# Patient Record
Sex: Female | Born: 1945
Health system: Southern US, Community
[De-identification: ages and names within clinical notes are randomized; demographics above are authoritative.]

## PROBLEM LIST (undated history)

## (undated) DIAGNOSIS — R42 Dizziness and giddiness: Secondary | ICD-10-CM

## (undated) DIAGNOSIS — R112 Nausea with vomiting, unspecified: Secondary | ICD-10-CM

## (undated) DIAGNOSIS — F329 Major depressive disorder, single episode, unspecified: Secondary | ICD-10-CM

## (undated) DIAGNOSIS — J45909 Unspecified asthma, uncomplicated: Secondary | ICD-10-CM

## (undated) DIAGNOSIS — S83209A Unspecified tear of unspecified meniscus, current injury, unspecified knee, initial encounter: Secondary | ICD-10-CM

## (undated) DIAGNOSIS — M549 Dorsalgia, unspecified: Secondary | ICD-10-CM

## (undated) DIAGNOSIS — K219 Gastro-esophageal reflux disease without esophagitis: Secondary | ICD-10-CM

## (undated) DIAGNOSIS — M1990 Unspecified osteoarthritis, unspecified site: Secondary | ICD-10-CM

## (undated) DIAGNOSIS — E039 Hypothyroidism, unspecified: Secondary | ICD-10-CM

## (undated) DIAGNOSIS — Z9889 Other specified postprocedural states: Secondary | ICD-10-CM

## (undated) DIAGNOSIS — Z8719 Personal history of other diseases of the digestive system: Secondary | ICD-10-CM

## (undated) DIAGNOSIS — T4145XA Adverse effect of unspecified anesthetic, initial encounter: Secondary | ICD-10-CM

## (undated) DIAGNOSIS — G8929 Other chronic pain: Secondary | ICD-10-CM

## (undated) DIAGNOSIS — E079 Disorder of thyroid, unspecified: Secondary | ICD-10-CM

## (undated) DIAGNOSIS — M797 Fibromyalgia: Secondary | ICD-10-CM

## (undated) DIAGNOSIS — Z973 Presence of spectacles and contact lenses: Secondary | ICD-10-CM

## (undated) DIAGNOSIS — F32A Depression, unspecified: Secondary | ICD-10-CM

## (undated) DIAGNOSIS — I10 Essential (primary) hypertension: Secondary | ICD-10-CM

## (undated) DIAGNOSIS — R011 Cardiac murmur, unspecified: Secondary | ICD-10-CM

## (undated) HISTORY — PX: CHOLECYSTECTOMY: SHX55

## (undated) HISTORY — PX: LAMINECTOMY: SHX219

## (undated) HISTORY — PX: BREAST SURGERY: SHX581

## (undated) HISTORY — PX: ELBOW ARTHROSCOPY: SUR87

## (undated) HISTORY — DX: Disorder of thyroid, unspecified: E07.9

## (undated) HISTORY — PX: FOOT GANGLION EXCISION: SHX1660

## (undated) HISTORY — PX: BACK SURGERY: SHX140

## (undated) HISTORY — PX: KNEE ARTHROSCOPY: SHX127

## (undated) HISTORY — PX: APPENDECTOMY: SHX54

## (undated) HISTORY — DX: Cardiac murmur, unspecified: R01.1

## (undated) HISTORY — PX: CARPAL TUNNEL RELEASE: SHX101

## (undated) HISTORY — PX: OTHER SURGICAL HISTORY: SHX169

## (undated) HISTORY — PX: ABDOMINAL HYSTERECTOMY: SHX81

## (undated) HISTORY — PX: COLONOSCOPY W/ POLYPECTOMY: SHX1380

---

## 1966-06-06 DIAGNOSIS — T8859XA Other complications of anesthesia, initial encounter: Secondary | ICD-10-CM

## 1966-06-06 HISTORY — PX: DILATION AND CURETTAGE OF UTERUS: SHX78

## 1966-06-06 HISTORY — DX: Other complications of anesthesia, initial encounter: T88.59XA

## 1974-06-06 HISTORY — PX: ABDOMINAL HYSTERECTOMY: SHX81

## 1998-02-02 ENCOUNTER — Ambulatory Visit (HOSPITAL_COMMUNITY): Admission: RE | Admit: 1998-02-02 | Discharge: 1998-02-02 | Payer: Self-pay | Admitting: Gastroenterology

## 1998-06-12 ENCOUNTER — Other Ambulatory Visit: Admission: RE | Admit: 1998-06-12 | Discharge: 1998-06-12 | Payer: Self-pay | Admitting: Family Medicine

## 1998-07-13 ENCOUNTER — Other Ambulatory Visit: Admission: RE | Admit: 1998-07-13 | Discharge: 1998-07-13 | Payer: Self-pay | Admitting: Family Medicine

## 1998-12-16 ENCOUNTER — Ambulatory Visit (HOSPITAL_COMMUNITY): Admission: RE | Admit: 1998-12-16 | Discharge: 1998-12-16 | Payer: Self-pay | Admitting: Obstetrics & Gynecology

## 2000-03-01 ENCOUNTER — Encounter: Payer: Self-pay | Admitting: Emergency Medicine

## 2000-03-01 ENCOUNTER — Emergency Department (HOSPITAL_COMMUNITY): Admission: EM | Admit: 2000-03-01 | Discharge: 2000-03-02 | Payer: Self-pay | Admitting: Emergency Medicine

## 2000-10-16 ENCOUNTER — Ambulatory Visit (HOSPITAL_COMMUNITY): Admission: RE | Admit: 2000-10-16 | Discharge: 2000-10-16 | Payer: Self-pay | Admitting: Neurosurgery

## 2000-10-16 ENCOUNTER — Encounter: Payer: Self-pay | Admitting: Neurosurgery

## 2000-10-20 ENCOUNTER — Other Ambulatory Visit: Admission: RE | Admit: 2000-10-20 | Discharge: 2000-10-20 | Payer: Self-pay | Admitting: Podiatry

## 2001-04-12 ENCOUNTER — Encounter: Payer: Self-pay | Admitting: Gastroenterology

## 2001-04-12 ENCOUNTER — Encounter: Admission: RE | Admit: 2001-04-12 | Discharge: 2001-04-12 | Payer: Self-pay | Admitting: Gastroenterology

## 2001-07-11 ENCOUNTER — Other Ambulatory Visit: Admission: RE | Admit: 2001-07-11 | Discharge: 2001-07-11 | Payer: Self-pay | Admitting: Obstetrics and Gynecology

## 2001-08-23 ENCOUNTER — Other Ambulatory Visit: Admission: RE | Admit: 2001-08-23 | Discharge: 2001-08-23 | Payer: Self-pay | Admitting: Obstetrics and Gynecology

## 2002-05-10 ENCOUNTER — Encounter: Payer: Self-pay | Admitting: Neurosurgery

## 2002-05-14 ENCOUNTER — Encounter: Payer: Self-pay | Admitting: Neurosurgery

## 2002-05-14 ENCOUNTER — Inpatient Hospital Stay (HOSPITAL_COMMUNITY): Admission: RE | Admit: 2002-05-14 | Discharge: 2002-05-18 | Payer: Self-pay | Admitting: Neurosurgery

## 2003-03-25 ENCOUNTER — Other Ambulatory Visit: Admission: RE | Admit: 2003-03-25 | Discharge: 2003-03-25 | Payer: Self-pay | Admitting: Obstetrics and Gynecology

## 2003-12-31 ENCOUNTER — Ambulatory Visit (HOSPITAL_COMMUNITY): Admission: RE | Admit: 2003-12-31 | Discharge: 2003-12-31 | Payer: Self-pay | Admitting: Gastroenterology

## 2004-01-20 ENCOUNTER — Ambulatory Visit (HOSPITAL_COMMUNITY): Admission: RE | Admit: 2004-01-20 | Discharge: 2004-01-20 | Payer: Self-pay | Admitting: Cardiology

## 2004-05-20 ENCOUNTER — Emergency Department (HOSPITAL_COMMUNITY): Admission: EM | Admit: 2004-05-20 | Discharge: 2004-05-20 | Payer: Self-pay | Admitting: Emergency Medicine

## 2005-01-14 ENCOUNTER — Ambulatory Visit: Payer: Self-pay | Admitting: Internal Medicine

## 2005-02-14 ENCOUNTER — Ambulatory Visit: Payer: Self-pay | Admitting: Internal Medicine

## 2005-03-14 ENCOUNTER — Ambulatory Visit: Payer: Self-pay | Admitting: Internal Medicine

## 2007-07-20 ENCOUNTER — Encounter (INDEPENDENT_AMBULATORY_CARE_PROVIDER_SITE_OTHER): Payer: Self-pay | Admitting: *Deleted

## 2007-07-20 ENCOUNTER — Ambulatory Visit (HOSPITAL_COMMUNITY): Admission: RE | Admit: 2007-07-20 | Discharge: 2007-07-20 | Payer: Self-pay | Admitting: *Deleted

## 2008-10-16 ENCOUNTER — Emergency Department (HOSPITAL_BASED_OUTPATIENT_CLINIC_OR_DEPARTMENT_OTHER): Admission: EM | Admit: 2008-10-16 | Discharge: 2008-10-17 | Payer: Self-pay | Admitting: Emergency Medicine

## 2008-11-13 ENCOUNTER — Encounter: Admission: RE | Admit: 2008-11-13 | Discharge: 2008-11-13 | Payer: Self-pay | Admitting: Internal Medicine

## 2008-12-01 ENCOUNTER — Emergency Department (HOSPITAL_BASED_OUTPATIENT_CLINIC_OR_DEPARTMENT_OTHER): Admission: EM | Admit: 2008-12-01 | Discharge: 2008-12-01 | Payer: Self-pay | Admitting: Emergency Medicine

## 2010-05-21 ENCOUNTER — Ambulatory Visit
Admission: RE | Admit: 2010-05-21 | Discharge: 2010-05-21 | Payer: Self-pay | Source: Home / Self Care | Attending: Orthopedic Surgery | Admitting: Orthopedic Surgery

## 2010-07-02 ENCOUNTER — Encounter
Admission: RE | Admit: 2010-07-02 | Discharge: 2010-07-02 | Payer: Self-pay | Source: Home / Self Care | Attending: Internal Medicine | Admitting: Internal Medicine

## 2010-08-16 LAB — BASIC METABOLIC PANEL
BUN: 11 mg/dL (ref 6–23)
CO2: 28 mEq/L (ref 19–32)
Calcium: 9.3 mg/dL (ref 8.4–10.5)
Chloride: 106 mEq/L (ref 96–112)
Creatinine, Ser: 0.89 mg/dL (ref 0.4–1.2)
GFR calc Af Amer: >60 mL/min (ref >60)
GFR calc non Af Amer: >60 mL/min (ref >60)
Glucose, Bld: 94 mg/dL (ref 70–99)
Potassium: 3.9 mEq/L (ref 3.5–5.1)
Sodium: 140 mEq/L (ref 135–145)

## 2010-08-16 LAB — POCT HEMOGLOBIN-HEMACUE: Hemoglobin: 14.2 g/dL (ref 12.0–15.0)

## 2010-09-13 LAB — URINALYSIS, ROUTINE W REFLEX MICROSCOPIC
Bilirubin Urine: NEGATIVE
Glucose, UA: NEGATIVE mg/dL
Ketones, ur: NEGATIVE mg/dL
Leukocytes, UA: NEGATIVE
Nitrite: NEGATIVE
Protein, ur: NEGATIVE mg/dL
Specific Gravity, Urine: 1.005 (ref 1.005–1.030)
Urobilinogen, UA: 0.2 mg/dL (ref 0.0–1.0)
pH: 7 (ref 5.0–8.0)

## 2010-09-13 LAB — COMPREHENSIVE METABOLIC PANEL
ALT: 26 U/L (ref 0–35)
AST: 32 U/L (ref 0–37)
Albumin: 4.1 g/dL (ref 3.5–5.2)
Alkaline Phosphatase: 83 U/L (ref 39–117)
BUN: 13 mg/dL (ref 6–23)
CO2: 29 mEq/L (ref 19–32)
Calcium: 9.7 mg/dL (ref 8.4–10.5)
Chloride: 105 mEq/L (ref 96–112)
Creatinine, Ser: 0.9 mg/dL (ref 0.4–1.2)
GFR calc Af Amer: >60 mL/min (ref >60)
GFR calc non Af Amer: >60 mL/min (ref >60)
Glucose, Bld: 90 mg/dL (ref 70–99)
Potassium: 3.6 mEq/L (ref 3.5–5.1)
Sodium: 142 mEq/L (ref 135–145)
Total Bilirubin: 0.7 mg/dL (ref 0.3–1.2)
Total Protein: 7.2 g/dL (ref 6.0–8.3)

## 2010-09-13 LAB — DIFFERENTIAL
Basophils Absolute: 0 10*3/uL (ref 0.0–0.1)
Basophils Relative: 0 % (ref 0–1)
Eosinophils Absolute: 0 10*3/uL (ref 0.0–0.7)
Eosinophils Relative: 0 % (ref 0–5)
Lymphocytes Relative: 33 % (ref 12–46)
Lymphs Abs: 2.3 10*3/uL (ref 0.7–4.0)
Monocytes Absolute: 0.4 10*3/uL (ref 0.1–1.0)
Monocytes Relative: 5 % (ref 3–12)
Neutro Abs: 4.2 10*3/uL (ref 1.7–7.7)
Neutrophils Relative %: 62 % (ref 43–77)

## 2010-09-13 LAB — URINE MICROSCOPIC-ADD ON

## 2010-09-13 LAB — URINE CULTURE
Colony Count: NO GROWTH
Culture: NO GROWTH

## 2010-09-13 LAB — CBC
HCT: 39.8 % (ref 36.0–46.0)
Hemoglobin: 13.8 g/dL (ref 12.0–15.0)
MCHC: 34.5 g/dL (ref 30.0–36.0)
MCV: 89.8 fL (ref 78.0–100.0)
Platelets: 277 10*3/uL (ref 150–400)
RBC: 4.43 MIL/uL (ref 3.87–5.11)
RDW: 13.9 % (ref 11.5–15.5)
WBC: 6.9 10*3/uL (ref 4.0–10.5)

## 2010-09-13 LAB — PREGNANCY, URINE: Preg Test, Ur: NEGATIVE

## 2010-09-14 LAB — URINALYSIS, ROUTINE W REFLEX MICROSCOPIC
Bilirubin Urine: NEGATIVE
Glucose, UA: NEGATIVE mg/dL
Ketones, ur: NEGATIVE mg/dL
Nitrite: NEGATIVE
Protein, ur: 100 mg/dL — AB
Specific Gravity, Urine: 1.017 (ref 1.005–1.030)
Urobilinogen, UA: 1 mg/dL (ref 0.0–1.0)
pH: 7 (ref 5.0–8.0)

## 2010-09-14 LAB — URINE MICROSCOPIC-ADD ON

## 2010-09-14 LAB — URINE CULTURE
Colony Count: NO GROWTH
Culture: NO GROWTH

## 2010-10-19 NOTE — Op Note (Signed)
[name], Victoria Hudson          ACCOUNT NO.:  [id number]   MEDICAL RECORD NO.:  [id number]          PATIENT TYPE:  AMB   LOCATION:  ENDO                         FACILITY:  [facility]   PHYSICIAN:  Georgiana Spinner, M.D.    DATE OF BIRTH:  [birth year]   DATE OF PROCEDURE:  07/20/2007  DATE OF DISCHARGE:                               OPERATIVE REPORT   PROCEDURE:  Colonoscopy.   ANESTHESIA:  None further given.   PROCEDURE:  With the patient mildly sedated in the left lateral  decubitus position the Pentax videoscopic colonoscope was inserted in  the rectum and passed under direct vision to the cecum identified by  crow's foot of the cecum, ileocecal valve, both of which were  photographed.  From this point the colonoscope was slowly withdrawn  taking circumferential views of the colonic mucosa stopping in the  rectum which appeared normal on direct and showed a small polyp on  retroflexed view.  The endoscope was straightened and withdrawn after  which this polyp was removed using hot biopsy forceps technique setting  of 20/150 blended current.  The endoscope as noted was withdrawn after  straightening.  The patient's vital signs and pulse oximeter remained  stable.  The patient tolerated the procedure well without apparent  complications.   FINDINGS:  Rectal polyp, otherwise an unremarkable exam.   PLAN:  Await biopsy report.  The patient will call me for results and  follow up with me as needed as an outpatient.           ______________________________  Georgiana Spinner, M.D.     GMO/MEDQ  D:  07/20/2007  T:  07/21/2007  Job:  [id number]

## 2010-10-19 NOTE — Op Note (Signed)
[name], [name] NO.:  [id number]   MEDICAL RECORD NO.:  [id number]          PATIENT TYPE:  AMB   LOCATION:  ENDO                         FACILITY:  [facility]   PHYSICIAN:  Georgiana Spinner, M.D.    DATE OF BIRTH:  Aug 15, 1945   DATE OF PROCEDURE:  07/20/2007  DATE OF DISCHARGE:                               OPERATIVE REPORT   PROCEDURE:  Upper endoscopy.   INDICATIONS:  Gastroesophageal reflux disease.   ANESTHESIA:  Demerol 100 mg, Versed 10 mg.   PROCEDURE IN DETAIL:  With the patient mildly sedated in the left  lateral decubitus position the Pentax videoscopic endoscope was inserted  in the mouth, passed under direct vision through the esophagus which  appeared normal.  There was no evidence of Barrett's.  We entered into  the stomach, fundus, body, antrum, duodenal bulb, second portion and  duodenum all appeared normal.  From this point the endoscope was slowly  withdrawn taking circumferential views of the duodenal mucosa until the  endoscope was pulled back into the stomach and placed in retroflexion to  view the stomach from below.  The endoscope was straightened and  withdrawn taking circumferential views of remaining gastric and  esophageal mucosa.  The patient's vital signs, pulse oximeter remained  stable.  The patient tolerated the procedure well without apparent  complication.   FINDINGS:  Negative examination.   PLAN:  Proceed to colonoscopy.           ______________________________  Georgiana Spinner, M.D.     GMO/MEDQ  D:  07/20/2007  T:  07/21/2007  Job:  [id number]

## 2010-10-22 NOTE — Op Note (Signed)
Victoria Hudson, Victoria Hudson                    ACCOUNT NO.:  [id number]   MEDICAL RECORD NO.:  [id number]                   PATIENT TYPE:  INP   LOCATION:  3005                                 FACILITY:  [facility]   PHYSICIAN:  Hilda Lias, M.D.                DATE OF BIRTH:  [birth year]   DATE OF PROCEDURE:  05/14/2002  DATE OF DISCHARGE:                                 OPERATIVE REPORT   PREOPERATIVE DIAGNOSIS:  1. L4-5 spondylolisthesis.  2. Degenerative disk disease.  3. Chronic L5 radiculopathy.  4. Status post L4-5 diskectomy.   POSTOPERATIVE DIAGNOSES:  1. L4-5 spondylolisthesis.  2. Degenerative disk disease.  3. Chronic L5 radiculopathy.  4. Status post L4-5 diskectomy.   OPERATION/PROCEDURE:  1. Bilateral L4-5 diskectomy.  2. Bilateral facetectomy.  3. Bone fusion using interbody allograft.  4. Posterolateral fusion, pedicle screw at L4, L5, C-arm.   SURGEON:  Hilda Lias, M.D.   ASSISTANT:  Stefani Dama, M.D.   ANESTHESIA:  General.   HISTORY:  The patient was admitted because of back pain when raising both  legs, left worse than right.  The patient previously had surgery at the L4-  5.  X-ray shows spondylolisthesis at the L4-5.  The risks were explained in  the history and physical.  The patient was seen last year by a different  neurosurgeon who advised the same procedure.   DESCRIPTION OF PROCEDURE:  The patient was taken to the OR and she was  positioned prone.  The back was prepped with Betadine.  A midline incision  was transecting the previous scar was made.  Our incision was carried out in  order to localize L4-5.  This was proved at the __________ and on the left  side she has quite a bit of scar.  It was difficult to enter the spinal  canal on the left side so we started going in the right side.  We removed  the spinous process of L5 and we proceeded with bilateral laminectomy.  We  went ahead and did bilateral facetectomy.  We found the L4  as well as the L5  nerve root.  In the right side they were normal.  On the left side there was  quite a bit of scar tissue __________  to accomplish to free both the L4 and  L5 nerve root.  Then we entered the disk space and a total gross diskectomy  was achieved.  Using curet, we were able to remove the end plates and we  introduced 8 x 24 bone grafts bilaterally.  In between and laterally we used  __________  autograft.  Having done this and having good decompression of  the L4-L5 nerve root, with the C-arm we found the pedicle of L4-L5.  We  introduced a pedicle probe followed by a tat, and then pedicle screw, 4 mm  in length.  This was done with the help of the C-arm.  Lateral plus AP view  showed good position of the pedicle screw.  From then on, we removed  periosteum of the transverse process of L4-5.  Using the rest of the  patient's bone graft, we filled up the lateral canal.  Then a rod was  applied and the rod was secured in placed using a cap.  Having done this,  there was an area where there was some CSF leak and using Prolene, we closed  the dural matter.  After that we used Dissell.  The area was irrigated and  wound was closed with Vicryl and Steri-Strips.  The patient did well.                                               Hilda Lias, M.D.    EB/MEDQ  D:  05/14/2002  T:  05/14/2002  Job:  [id number]

## 2010-10-22 NOTE — Discharge Summary (Signed)
   NAMEANNETE, AYUSO                    ACCOUNT NO.:  [id number]   MEDICAL RECORD NO.:  [id number]                   PATIENT TYPE:  INP   LOCATION:  3005                                 FACILITY:  [facility]   PHYSICIAN:  Stefani Dama, M.D.               DATE OF BIRTH:  1946/02/18   DATE OF ADMISSION:  05/14/2002  DATE OF DISCHARGE:  05/18/2002                                 DISCHARGE SUMMARY   ADMISSION DIAGNOSES:  1. Lumbar spondylolisthesis.  2. Degenerative disk disease.  3. Chronic L5 radiculopathy.  4. History of L4-5 diskectomy.   DISCHARGE DIAGNOSES:  1. Lumbar spondylolisthesis.  2. Degenerative disk disease.  3. Chronic L5 radiculopathy.  4. History of L4-5 diskectomy.   CONDITION ON DISCHARGE:  Improving.   MAJOR OPERATION:  Bilateral L4-5 diskectomy, facetectomy, posterior  interbody arthrodesis with bone spacers, and posterolateral arthrodesis with  autograft and allograft pedicle screw fixation at L4-5.  This was performed  on May 14, 2002.   HISTORY OF PRESENT ILLNESS:  The patient had work-up as an outpatient for  chronic lumbar radiculopathy which demonstrated that she had a  spondylolisthesis at the L4-5 level.   HOSPITAL COURSE:  She underwent the above-named operation the day of  admission.  Postoperatively, she was slow to mobilize with some significant  problems with back pain and some residual radiculopathy.  This has improved  to the point where at the time of discharge she is tolerating oral pain  medication in the form of Percocet and Flexeril as a muscle relaxer.  She  has had some difficulty with constipation.  This is improving.  The incision  is clean and dry.   DISPOSITION:  She is discharged home.   DISCHARGE MEDICATIONS:  She is discharged with prescriptions for Percocet,  #60 without refills, and Flexeril 10 mg, #40 with p.r.n. refills.   FOLLOW-UP:  She will be seen in the office in two weeks' time.                              Stefani Dama, M.D.    Merla Riches  D:  05/18/2002  T:  05/19/2002  Job:  [id number]

## 2010-10-22 NOTE — H&P (Signed)
NAME:  Victoria Hudson, Victoria Hudson                    ACCOUNT NO.:  [id number]   MEDICAL RECORD NO.:  [id number]                   PATIENT TYPE:  INP   LOCATION:  NA                                   FACILITY:  [facility]   PHYSICIAN:  Hilda Lias, M.D.                DATE OF BIRTH:  Jan 18, 1946   DATE OF ADMISSION:  05/14/2002  DATE OF DISCHARGE:                                HISTORY & PHYSICAL   HISTORY OF PRESENT ILLNESS:  This is a lady who back in 1998 underwent a  left L4-5 diskectomy.  The patient has been well for many years and now she  is complaining of bilateral leg pain.  She says that the left leg is getting  weaker and she complains of a burning sensation on the top of the left foot.  The patient denies any problems with bowel or bladder.  Last year she was  seen by a neurosurgeon, who advised her to have surgery.  Nevertheless, she  came to [place] for final treatment.   PAST MEDICAL HISTORY:  1. Breast biopsy.  2. Hand surgery.  3. Hysterectomy.  4. Lumbar diskectomy.   ALLERGIES:  She is allergic to ASPIRIN and SULFA.   SOCIAL HISTORY:  The patient does not smoke and does not drink.   FAMILY HISTORY:  Unremarkable.   REVIEW OF SYSTEMS:  Positive for high blood pressure, neck pain, and arm  pain.   PHYSICAL EXAMINATION:  HEIGHT:  5 feet 2 inches.  WEIGHT:  168 pounds.  GENERAL APPEARANCE:  The patient came to my office and she was limping from  the left leg.  HEENT:  Normal.  NECK:  Normal.  SKIN:  Normal.  ABDOMEN:  Normal  EXTREMITIES:  Normal pulses.  NEUROLOGIC:  Mental Status:  Normal.  Cranial Nerves:  Normal.  Strength is  5/5, except that she had weakness of dorsiflexion in both feet, 4/5.  Sensory:  She complained of sensitivity involving the L4-5 nerve root.  Coordination:  Normal.  Straight Leg Raising:  Positive bilaterally around  60 degrees on right side and 45 degrees on the left.  The patient had a  midline scar and she had decrease of extension and  lateralization secondary  to pain.   LABORATORY DATA:  The lumbar spine x-ray showed that she had  spondylolisthesis at the level of L4-5 with loss of space at this level.  It  moved from 3-5 mm __________.  The MRI shows spondylosis with __________ on  the left side and hypertrophy to the facet.   CLINICAL IMPRESSION:  Spondylolisthesis at L4-5 with a chronic L5  radiculopathy.    RECOMMENDATIONS:  The patient is being admitted for L4-5 diskectomy,  interbody fusion, pedicle screw, and posterolateral fusion.  The patient  knows about the risks with surgery, such as no improvement, worsening of the  pain, worsening of the weakness, damage to the vessels of the abdomen,  bleeding, failure of the pedicle screws, and the need for further surgery.                                               Hilda Lias, M.D.    EB/MEDQ  D:  05/14/2002  T:  05/14/2002  Job:  [id number]

## 2012-06-06 HISTORY — PX: COLONOSCOPY: SHX174

## 2012-07-10 ENCOUNTER — Encounter: Payer: Self-pay | Admitting: Internal Medicine

## 2012-08-15 ENCOUNTER — Encounter: Payer: Self-pay | Admitting: Internal Medicine

## 2012-08-15 ENCOUNTER — Ambulatory Visit (AMBULATORY_SURGERY_CENTER): Payer: Medicare Other

## 2012-08-15 VITALS — Ht 61.75 in | Wt 165.0 lb

## 2012-08-15 DIAGNOSIS — Z1211 Encounter for screening for malignant neoplasm of colon: Secondary | ICD-10-CM

## 2012-08-15 MED ORDER — MOVIPREP 100 G PO SOLR: 1.0000 | Freq: Once | ORAL | Status: DC

## 2012-08-24 ENCOUNTER — Encounter: Payer: Self-pay | Admitting: Internal Medicine

## 2012-08-24 ENCOUNTER — Ambulatory Visit (AMBULATORY_SURGERY_CENTER): Payer: Medicare Other | Admitting: Internal Medicine

## 2012-08-24 VITALS — BP 158/86 | HR 72 | Temp 97.9°F | Resp 16 | Ht 61.75 in | Wt 165.0 lb

## 2012-08-24 DIAGNOSIS — Z1211 Encounter for screening for malignant neoplasm of colon: Secondary | ICD-10-CM

## 2012-08-24 MED ORDER — SODIUM CHLORIDE 0.9 % IV SOLN: 500.0000 mL | INTRAVENOUS | Status: DC

## 2012-08-24 NOTE — Progress Notes (Signed)
Report to pacu rn, vss, bbs=clear 

## 2012-08-24 NOTE — Progress Notes (Signed)
Patient did not experience any of the following events: a burn prior to discharge; a fall within the facility; wrong site/side/patient/procedure/implant event; or a hospital transfer or hospital admission upon discharge from the facility. (G8907) Patient did not have preoperative order for IV antibiotic SSI prophylaxis. (G8918)  

## 2012-08-24 NOTE — Op Note (Signed)
[facility] [address] [place], [address]   COLONOSCOPY PROCEDURE REPORT  PATIENT: Victoria Hudson, Victoria Hudson  MR#: [id number] BIRTHDATE: 20-Apr-1946 , 67  yrs. old GENDER: Female ENDOSCOPIST: Hart Carwin, MD REFERRED BY:  Juline Patch, M.D. PROCEDURE DATE:  08/24/2012 PROCEDURE:   Colonoscopy, screening ASA CLASS:   Class II INDICATIONS:Average risk patient for colon cancer and colonoscopy in 2002 and 2009?? polyp?, record not available. MEDICATIONS: Propofol (Diprivan) 170 mg IV  DESCRIPTION OF PROCEDURE:   After the risks and benefits and of the procedure were explained, informed consent was obtained.  A digital rectal exam revealed no abnormalities of the rectum.    The LB CF-[id number]  endoscope was introduced through the anus and advanced to the cecum, which was identified by both the appendix and ileocecal valve .  The quality of the prep was excellent, using MoviPrep .  The instrument was then slowly withdrawn as the colon was fully examined.     COLON FINDINGS: A normal appearing cecum, ileocecal valve, and appendiceal orifice were identified.  The ascending, hepatic flexure, transverse, splenic flexure, descending, sigmoid colon and rectum appeared unremarkable.  No polyps or cancers were seen. Retroflexed views revealed no abnormalities.     The scope was then withdrawn from the patient and the procedure completed.  COMPLICATIONS: There were no complications. ENDOSCOPIC IMPRESSION: Normal colon  RECOMMENDATIONS: High fiber diet   REPEAT EXAM: In 7 year(s)  for Colonoscopy.  cc:  _______________________________ eSignedHart Carwin, MD 08/24/2012 11:36 AM

## 2012-08-24 NOTE — Patient Instructions (Addendum)

## 2012-08-27 ENCOUNTER — Telehealth: Payer: Self-pay | Admitting: *Deleted

## 2012-08-27 NOTE — Telephone Encounter (Signed)
  Follow up Call-  Call back number 08/24/2012  Post procedure Call Back phone  # [phone]  Permission to leave phone message Yes     Patient questions:  Do you have a fever, pain , or abdominal swelling? no Pain Score  0 *  Have you tolerated food without any problems? yes  Have you been able to return to your normal activities? yes  Do you have any questions about your discharge instructions: Diet   no Medications  no Follow up visit  no  Do you have questions or concerns about your Care? no  Actions: * If pain score is 4 or above: No action needed, pain <4.

## 2012-09-08 ENCOUNTER — Emergency Department (HOSPITAL_COMMUNITY)
Admission: EM | Admit: 2012-09-08 | Discharge: 2012-09-08 | Disposition: A | Discharge status: Home / Self Care | Payer: Medicare Other | Attending: Emergency Medicine | Admitting: Emergency Medicine

## 2012-09-08 ENCOUNTER — Emergency Department (HOSPITAL_COMMUNITY): Payer: Medicare Other

## 2012-09-08 DIAGNOSIS — Z9889 Other specified postprocedural states: Secondary | ICD-10-CM | POA: Insufficient documentation

## 2012-09-08 DIAGNOSIS — M79609 Pain in unspecified limb: Secondary | ICD-10-CM | POA: Insufficient documentation

## 2012-09-08 DIAGNOSIS — E079 Disorder of thyroid, unspecified: Secondary | ICD-10-CM | POA: Insufficient documentation

## 2012-09-08 DIAGNOSIS — Z7982 Long term (current) use of aspirin: Secondary | ICD-10-CM | POA: Insufficient documentation

## 2012-09-08 DIAGNOSIS — M5412 Radiculopathy, cervical region: Secondary | ICD-10-CM

## 2012-09-08 DIAGNOSIS — Z79899 Other long term (current) drug therapy: Secondary | ICD-10-CM | POA: Insufficient documentation

## 2012-09-08 LAB — BASIC METABOLIC PANEL
BUN: 15 mg/dL (ref 6–23)
CO2: 24 mEq/L (ref 19–32)
Calcium: 9.5 mg/dL (ref 8.4–10.5)
Chloride: 105 mEq/L (ref 96–112)
Creatinine, Ser: 0.75 mg/dL (ref 0.50–1.10)
GFR calc Af Amer: >90 mL/min (ref >90)
GFR calc non Af Amer: 84 mL/min — ABNORMAL LOW (ref >90)
Glucose, Bld: 117 mg/dL — ABNORMAL HIGH (ref 70–99)
Potassium: 3.5 mEq/L (ref 3.5–5.1)
Sodium: 140 mEq/L (ref 135–145)

## 2012-09-08 LAB — CBC
HCT: 40.8 % (ref 36.0–46.0)
Hemoglobin: 13.9 g/dL (ref 12.0–15.0)
MCH: 29.5 pg (ref 26.0–34.0)
MCHC: 34.1 g/dL (ref 30.0–36.0)
MCV: 86.6 fL (ref 78.0–100.0)
Platelets: 280 10*3/uL (ref 150–400)
RBC: 4.71 MIL/uL (ref 3.87–5.11)
RDW: 13.3 % (ref 11.5–15.5)
WBC: 6.6 10*3/uL (ref 4.0–10.5)

## 2012-09-08 LAB — TROPONIN I: Troponin I: <0.3 ng/mL (ref <0.30)

## 2012-09-08 MED ORDER — OXYCODONE-ACETAMINOPHEN 7.5-325 MG PO TABS: 1.0000 | ORAL_TABLET | ORAL | Status: DC | PRN

## 2012-09-08 MED ORDER — TRAMADOL HCL 50 MG PO TABS: 50.0000 mg | ORAL_TABLET | Freq: Four times a day (QID) | ORAL | Status: DC | PRN

## 2012-09-08 MED ORDER — KETOROLAC TROMETHAMINE 30 MG/ML IJ SOLN
30.0000 mg | Freq: Once | INTRAMUSCULAR | Status: AC
  Administered 2012-09-08: 30 mg via INTRAVENOUS
  Filled 2012-09-08: qty 1

## 2012-09-08 NOTE — ED Notes (Signed)
Patient transported to X-ray 

## 2012-09-08 NOTE — ED Notes (Signed)
MD at bedside. 

## 2012-09-08 NOTE — ED Provider Notes (Signed)
History     CSN: [id number]  Arrival date & time 09/08/12  1907   First MD Initiated Contact with Patient 09/08/12 1923      Chief Complaint  Patient presents with  . Chest Pain  . left arm pain     HPI Pt. Came in per POV with daughters with complaint of chest pain @ 9/10, which started yesterday, got relief last night after taking xanax.25mg  x1 but pain came back at 9am this morning and got worsen @ 5;15 pm With BP of 194/41mmhg. pain started in the left neck area and radiated down the left arm. . Pt. Took Aspirin 81mg  x2 prior to coming to ED. Denies numbness on any of extremities.  Past Medical History  Diagnosis Date  . Thyroid disease     Past Surgical History  Procedure Laterality Date  . Abdominal hysterectomy    . Laminectomy      L 4 and 5; 2 separate surgeries  . Right hand      3 surgeries  . Left hand    . Knee arthroscopy      torn ligament  . Elbow arthroscopy      right  . Foot ganglion excision      left    Family History  Problem Relation Age of Onset  . Colon cancer Neg Hx     History  Substance Use Topics  . Smoking status: Never Smoker   . Smokeless tobacco: Never Used  . Alcohol Use: Not on file    OB History   Grav Para Term Preterm Abortions TAB SAB Ect Mult Living                  Review of Systems  Respiratory: Negative for shortness of breath.   All other systems reviewed and are negative.    Allergies  Aspirin; Beta adrenergic blockers; Mobic; Oxycodone; and Sulfa antibiotics  Home Medications   Current Outpatient Rx  Name  Route  Sig  Dispense  Refill  . aspirin EC 81 MG tablet   Oral   Take 81 mg by mouth 2 (two) times daily.         . cyclobenzaprine (FLEXERIL) 10 MG tablet   Oral   Take 10 mg by mouth every evening.          . estazolam (PROSOM) 2 MG tablet   Oral   Take 2 mg by mouth at bedtime.         . Feverfew 380 MG CAPS   Oral   Take by mouth.         . Glucosamine 500 MG TABS   Oral  Take by mouth 2 (two) times daily.         Marland Kitchen levothyroxine (SYNTHROID, LEVOTHROID) 50 MCG tablet   Oral   Take 50 mcg by mouth every morning.          . magnesium oxide (MAG-OX) 400 MG tablet   Oral   Take 400 mg by mouth daily.         . metoprolol succinate (TOPROL-XL) 50 MG 24 hr tablet   Oral   Take 50 mg by mouth every morning. Take with or immediately following a meal.         . mometasone (NASONEX) 50 MCG/ACT nasal spray   Nasal   Place 2 sprays into the nose daily as needed (allergies).         Marland Kitchen omeprazole (PRILOSEC) 20 MG capsule  Oral   Take 20 mg by mouth every morning.          Marland Kitchen oxyCODONE-acetaminophen (PERCOCET) 7.5-325 MG per tablet   Oral   Take 1 tablet by mouth every 4 (four) hours as needed for pain.   30 tablet   0   . traMADol (ULTRAM) 50 MG tablet   Oral   Take 1 tablet (50 mg total) by mouth every 6 (six) hours as needed for pain.   15 tablet   1    Meds ordered this encounter  Medications  . ketorolac (TORADOL) 30 MG/ML injection 30 mg    Sig:      BP 185/82  Pulse 74  Temp(Src) 97.9 F (36.6 C) (Oral)  Resp 18  Ht 5\' 2"  (1.575 m)  Wt 164 lb (74.39 kg)  BMI 29.99 kg/m2  SpO2 99%  Physical Exam  Nursing note and vitals reviewed. Constitutional: She is oriented to person, place, and time. She appears well-developed and well-nourished. No distress.  HENT:  Head: Normocephalic and atraumatic.  Eyes: Pupils are equal, round, and reactive to light.  Neck: Normal range of motion.    Outpatient reproduces pain  Cardiovascular: Normal rate and intact distal pulses.   Pulmonary/Chest: No respiratory distress.  Abdominal: Normal appearance. She exhibits no distension.  Musculoskeletal: Normal range of motion.  Neurological: She is alert and oriented to person, place, and time. No cranial nerve deficit.  Skin: Skin is warm and dry. No rash noted.  Psychiatric: She has a normal mood and affect. Her behavior is normal.     ED Course  Procedures (including critical care time)  Date: 09/08/2012  Rate: 65  Rhythm: normal sinus rhythm  QRS Axis: normal  Intervals: normal  ST/T Wave abnormalities: normal  Conduction Disutrbances: Incomplete right bundle-branch block  Narrative Interpretation: Unchanged when compared to previous tracing     Labs Reviewed  BASIC METABOLIC PANEL - Abnormal; Notable for the following:    Glucose, Bld 117 (*)    GFR calc non Af Amer 84 (*)    All other components within normal limits  CBC  TROPONIN I   Dg Chest 2 View  09/08/2012  *RADIOLOGY REPORT*    IMPRESSION: No acute abnormality.          1. Cervical radiculopathy       MDM  Pain atypical for ACS. Pain reproducible and increases with palpation in neck which seems to indicate and would explain pain going down her left arm.  I suspect she has an irritated cervical nerve.  Pain had been constant for over 12 hours so a negative troponin makes it unlikely to be ACS.       Nelia Shi, MD 09/10/12 [phone]

## 2012-09-08 NOTE — ED Notes (Signed)
Pt. Came in per POV with daughters with complaint of chest pain @ 9/10, which started yesterday, got relief last night after taking xanax.25mg  x1 but pain came back at 9am this morning and got worsen @ 5;15 pm  With BP of 194/19mmhg. Also complaint of pain of her left arm. Pt. Took  Aspirin 81mg  x2 prior to coming to ED. Denies numbness on any of extremities.

## 2012-11-28 ENCOUNTER — Other Ambulatory Visit: Payer: Self-pay | Admitting: Neurosurgery

## 2012-11-28 DIAGNOSIS — M549 Dorsalgia, unspecified: Secondary | ICD-10-CM

## 2012-11-28 DIAGNOSIS — M542 Cervicalgia: Secondary | ICD-10-CM

## 2012-11-28 DIAGNOSIS — M545 Low back pain, unspecified: Secondary | ICD-10-CM

## 2012-12-05 ENCOUNTER — Ambulatory Visit
Admission: RE | Admit: 2012-12-05 | Discharge: 2012-12-05 | Disposition: A | Discharge status: Home / Self Care | Payer: Medicare Other | Source: Ambulatory Visit | Attending: Neurosurgery | Admitting: Neurosurgery

## 2012-12-05 VITALS — BP 147/70 | HR 64

## 2012-12-05 DIAGNOSIS — M545 Low back pain, unspecified: Secondary | ICD-10-CM

## 2012-12-05 DIAGNOSIS — M549 Dorsalgia, unspecified: Secondary | ICD-10-CM

## 2012-12-05 DIAGNOSIS — M542 Cervicalgia: Secondary | ICD-10-CM

## 2012-12-05 MED ORDER — DIAZEPAM 5 MG PO TABS
5.0000 mg | ORAL_TABLET | Freq: Once | ORAL | Status: AC
  Administered 2012-12-05: 5 mg via ORAL

## 2012-12-05 MED ORDER — IOHEXOL 300 MG/ML  SOLN
10.0000 mL | Freq: Once | INTRAMUSCULAR | Status: AC | PRN
  Administered 2012-12-05: 10 mL via INTRATHECAL

## 2012-12-05 NOTE — Progress Notes (Signed)
Pt states she is comfortable post myelo

## 2012-12-05 NOTE — Progress Notes (Signed)
Discharge instructions explained to pt. 

## 2014-03-19 ENCOUNTER — Other Ambulatory Visit: Payer: Self-pay | Admitting: Internal Medicine

## 2014-03-19 DIAGNOSIS — R7989 Other specified abnormal findings of blood chemistry: Secondary | ICD-10-CM

## 2014-03-19 DIAGNOSIS — R945 Abnormal results of liver function studies: Principal | ICD-10-CM

## 2014-03-28 ENCOUNTER — Ambulatory Visit
Admission: RE | Admit: 2014-03-28 | Discharge: 2014-03-28 | Disposition: A | Discharge status: Home / Self Care | Payer: Medicare Other | Source: Ambulatory Visit | Attending: Internal Medicine | Admitting: Internal Medicine

## 2014-03-28 DIAGNOSIS — R945 Abnormal results of liver function studies: Principal | ICD-10-CM

## 2014-03-28 DIAGNOSIS — R7989 Other specified abnormal findings of blood chemistry: Secondary | ICD-10-CM

## 2014-06-24 ENCOUNTER — Other Ambulatory Visit: Payer: Self-pay

## 2014-06-24 DIAGNOSIS — I459 Conduction disorder, unspecified: Secondary | ICD-10-CM

## 2014-06-25 ENCOUNTER — Encounter: Payer: Self-pay | Admitting: *Deleted

## 2014-06-25 ENCOUNTER — Encounter (INDEPENDENT_AMBULATORY_CARE_PROVIDER_SITE_OTHER): Payer: PPO

## 2014-06-25 DIAGNOSIS — I459 Conduction disorder, unspecified: Secondary | ICD-10-CM

## 2014-06-25 NOTE — Progress Notes (Signed)
Patient ID: Victoria Hudson, female   DOB: October 03, 1945, 69 y.o.   MRN: [id number] [employer] 24 hour holter monitor applied to patient.

## 2014-11-13 ENCOUNTER — Other Ambulatory Visit: Payer: Self-pay | Admitting: Neurosurgery

## 2014-11-13 DIAGNOSIS — M4802 Spinal stenosis, cervical region: Secondary | ICD-10-CM

## 2014-11-13 DIAGNOSIS — IMO0002 Reserved for concepts with insufficient information to code with codable children: Secondary | ICD-10-CM

## 2014-11-19 ENCOUNTER — Ambulatory Visit
Admission: RE | Admit: 2014-11-19 | Discharge: 2014-11-19 | Disposition: A | Discharge status: Home / Self Care | Payer: PPO | Source: Ambulatory Visit | Attending: Neurosurgery | Admitting: Neurosurgery

## 2014-11-19 DIAGNOSIS — IMO0002 Reserved for concepts with insufficient information to code with codable children: Secondary | ICD-10-CM

## 2014-11-19 DIAGNOSIS — M4802 Spinal stenosis, cervical region: Secondary | ICD-10-CM

## 2014-11-19 MED ORDER — MEPERIDINE HCL 100 MG/ML IJ SOLN
75.0000 mg | Freq: Once | INTRAMUSCULAR | Status: AC
  Administered 2014-11-19: 75 mg via INTRAMUSCULAR

## 2014-11-19 MED ORDER — IOHEXOL 300 MG/ML  SOLN
10.0000 mL | Freq: Once | INTRAMUSCULAR | Status: AC | PRN
  Administered 2014-11-19: 10 mL via INTRATHECAL

## 2014-11-19 MED ORDER — DIAZEPAM 5 MG PO TABS
5.0000 mg | ORAL_TABLET | Freq: Once | ORAL | Status: AC
  Administered 2014-11-19: 5 mg via ORAL

## 2014-11-19 MED ORDER — ONDANSETRON HCL 4 MG/2ML IJ SOLN: 4.0000 mg | Freq: Four times a day (QID) | INTRAMUSCULAR | Status: DC | PRN

## 2014-11-19 MED ORDER — ONDANSETRON HCL 4 MG/2ML IJ SOLN
4.0000 mg | Freq: Once | INTRAMUSCULAR | Status: AC
  Administered 2014-11-19: 4 mg via INTRAMUSCULAR

## 2014-11-19 NOTE — Discharge Instructions (Signed)

## 2015-03-25 ENCOUNTER — Other Ambulatory Visit (HOSPITAL_COMMUNITY): Payer: Self-pay | Admitting: Neurosurgery

## 2015-04-27 ENCOUNTER — Inpatient Hospital Stay (HOSPITAL_COMMUNITY)
Admission: RE | Admit: 2015-04-27 | Discharge: 2015-04-27 | Disposition: A | Discharge status: Home / Self Care | Payer: Self-pay | Source: Ambulatory Visit

## 2015-04-27 NOTE — Progress Notes (Signed)
Pt did not show for scheduled 2:00 PM PAT appointment; lvm on pt home phone.

## 2015-05-04 ENCOUNTER — Other Ambulatory Visit: Payer: Self-pay | Admitting: Neurosurgery

## 2015-05-05 ENCOUNTER — Encounter (HOSPITAL_COMMUNITY): Payer: Self-pay | Admitting: *Deleted

## 2015-05-05 NOTE — Progress Notes (Signed)
Anesthesia Chart Review:  Pt is 69 year old female scheduled for C4-5, C5-6, C6-7 ACDF on 05/06/2015 with Dr. Joya Salm.   Pt is a same day work up.   PMH includes:  Thyroid disease. Although it is not listed in history, pt likely has HTN given medication list. Never smoker. BMI 30.   Medications include: ASA, levalbuterol, levothyroxine, losartan, metoprolol, prilosec.   Pt will need labs DOS.   Pt will need EKG DOS.   Holter monitor for sick sinus syndrome 06/28/14: sinus rhythm was rare PACs, rare PVCs. Sinus bradycardia with lowest HR 44 bpm in the AM.   Nuclear stress test 01/20/04:  -No evidence myocardial ischemia or infarction. Normal wall motion. EF 71%.   No other information is available. Records have been requested from PCP's office. Pt will need further evaluation by assigned anesthesiologist DOS.  Willeen Cass, FNP-BC [facility]/Anesthesiology Phone: [phone] 05/05/2015 5:07 PM

## 2015-05-06 ENCOUNTER — Inpatient Hospital Stay (HOSPITAL_COMMUNITY): Payer: PPO | Admitting: Emergency Medicine

## 2015-05-06 ENCOUNTER — Inpatient Hospital Stay (HOSPITAL_COMMUNITY)
Admission: RE | Admit: 2015-05-06 | Discharge: 2015-05-07 | DRG: 473 | Disposition: A | Discharge status: Home / Self Care | Payer: PPO | Source: Ambulatory Visit | Attending: Neurosurgery | Admitting: Neurosurgery

## 2015-05-06 ENCOUNTER — Encounter (HOSPITAL_COMMUNITY)
Admission: RE | Disposition: A | Discharge status: Home / Self Care | Payer: Self-pay | Source: Ambulatory Visit | Attending: Neurosurgery

## 2015-05-06 ENCOUNTER — Inpatient Hospital Stay (HOSPITAL_COMMUNITY): Payer: PPO

## 2015-05-06 ENCOUNTER — Encounter (HOSPITAL_COMMUNITY): Admission: RE | Payer: Self-pay | Source: Ambulatory Visit

## 2015-05-06 ENCOUNTER — Observation Stay (HOSPITAL_COMMUNITY): Payer: PPO

## 2015-05-06 ENCOUNTER — Encounter (HOSPITAL_COMMUNITY): Payer: Self-pay | Admitting: *Deleted

## 2015-05-06 ENCOUNTER — Ambulatory Visit (HOSPITAL_COMMUNITY): Admission: RE | Admit: 2015-05-06 | Payer: PPO | Source: Ambulatory Visit | Admitting: Neurosurgery

## 2015-05-06 DIAGNOSIS — Z79899 Other long term (current) drug therapy: Secondary | ICD-10-CM

## 2015-05-06 DIAGNOSIS — I10 Essential (primary) hypertension: Secondary | ICD-10-CM | POA: Diagnosis present

## 2015-05-06 DIAGNOSIS — M542 Cervicalgia: Secondary | ICD-10-CM | POA: Diagnosis not present

## 2015-05-06 DIAGNOSIS — M4802 Spinal stenosis, cervical region: Secondary | ICD-10-CM | POA: Diagnosis not present

## 2015-05-06 DIAGNOSIS — E039 Hypothyroidism, unspecified: Secondary | ICD-10-CM | POA: Diagnosis present

## 2015-05-06 DIAGNOSIS — K219 Gastro-esophageal reflux disease without esophagitis: Secondary | ICD-10-CM | POA: Diagnosis present

## 2015-05-06 DIAGNOSIS — Z7982 Long term (current) use of aspirin: Secondary | ICD-10-CM

## 2015-05-06 DIAGNOSIS — Z419 Encounter for procedure for purposes other than remedying health state, unspecified: Secondary | ICD-10-CM

## 2015-05-06 DIAGNOSIS — M4722 Other spondylosis with radiculopathy, cervical region: Secondary | ICD-10-CM | POA: Diagnosis present

## 2015-05-06 HISTORY — DX: Depression, unspecified: F32.A

## 2015-05-06 HISTORY — DX: Hypothyroidism, unspecified: E03.9

## 2015-05-06 HISTORY — DX: Unspecified osteoarthritis, unspecified site: M19.90

## 2015-05-06 HISTORY — PX: ANTERIOR CERVICAL DECOMP/DISCECTOMY FUSION: SHX1161

## 2015-05-06 HISTORY — DX: Gastro-esophageal reflux disease without esophagitis: K21.9

## 2015-05-06 HISTORY — DX: Major depressive disorder, single episode, unspecified: F32.9

## 2015-05-06 HISTORY — DX: Adverse effect of unspecified anesthetic, initial encounter: T41.45XA

## 2015-05-06 HISTORY — DX: Essential (primary) hypertension: I10

## 2015-05-06 HISTORY — DX: Unspecified asthma, uncomplicated: J45.909

## 2015-05-06 LAB — BASIC METABOLIC PANEL
Anion gap: 9 (ref 5–15)
BUN: 10 mg/dL (ref 6–20)
CO2: 24 mmol/L (ref 22–32)
Calcium: 9.4 mg/dL (ref 8.9–10.3)
Chloride: 107 mmol/L (ref 101–111)
Creatinine, Ser: 0.79 mg/dL (ref 0.44–1.00)
GFR calc Af Amer: >60 mL/min (ref >60)
GFR calc non Af Amer: >60 mL/min (ref >60)
Glucose, Bld: 99 mg/dL (ref 65–99)
Potassium: 3.4 mmol/L — ABNORMAL LOW (ref 3.5–5.1)
Sodium: 140 mmol/L (ref 135–145)

## 2015-05-06 LAB — CBC
HCT: 37.7 % (ref 36.0–46.0)
Hemoglobin: 12.7 g/dL (ref 12.0–15.0)
MCH: 29.8 pg (ref 26.0–34.0)
MCHC: 33.7 g/dL (ref 30.0–36.0)
MCV: 88.5 fL (ref 78.0–100.0)
Platelets: 229 10*3/uL (ref 150–400)
RBC: 4.26 MIL/uL (ref 3.87–5.11)
RDW: 13.5 % (ref 11.5–15.5)
WBC: 6.5 10*3/uL (ref 4.0–10.5)

## 2015-05-06 LAB — SURGICAL PCR SCREEN
MRSA, PCR: NEGATIVE
Staphylococcus aureus: NEGATIVE

## 2015-05-06 SURGERY — LUMBAR LAMINECTOMY/DECOMPRESSION MICRODISCECTOMY 1 LEVEL
Anesthesia: General | Laterality: Bilateral

## 2015-05-06 SURGERY — ANTERIOR CERVICAL DECOMPRESSION/DISCECTOMY FUSION 3 LEVELS
Anesthesia: General | Site: Neck

## 2015-05-06 MED ORDER — ONDANSETRON HCL 4 MG/2ML IJ SOLN
INTRAMUSCULAR | Status: AC
  Filled 2015-05-06: qty 2

## 2015-05-06 MED ORDER — LEVOTHYROXINE SODIUM 50 MCG PO TABS
50.0000 ug | ORAL_TABLET | Freq: Every day | ORAL | Status: DC
  Administered 2015-05-07: 50 ug via ORAL
  Filled 2015-05-06: qty 1

## 2015-05-06 MED ORDER — THROMBIN 20000 UNITS EX SOLR
CUTANEOUS | Status: DC | PRN
  Administered 2015-05-06: 09:00:00 via TOPICAL

## 2015-05-06 MED ORDER — MEPERIDINE HCL 25 MG/ML IJ SOLN: 6.2500 mg | INTRAMUSCULAR | Status: DC | PRN

## 2015-05-06 MED ORDER — THROMBIN 5000 UNITS EX SOLR
OROMUCOSAL | Status: DC | PRN
  Administered 2015-05-06: 09:00:00 via TOPICAL

## 2015-05-06 MED ORDER — PROPOFOL 10 MG/ML IV BOLUS
INTRAVENOUS | Status: AC
  Filled 2015-05-06: qty 20

## 2015-05-06 MED ORDER — SUCCINYLCHOLINE CHLORIDE 20 MG/ML IJ SOLN
INTRAMUSCULAR | Status: AC
  Filled 2015-05-06: qty 1

## 2015-05-06 MED ORDER — SUGAMMADEX SODIUM 200 MG/2ML IV SOLN
INTRAVENOUS | Status: DC | PRN
Start: 2015-05-06 — End: 2015-05-06
  Administered 2015-05-06: 150 mg via INTRAVENOUS

## 2015-05-06 MED ORDER — LOSARTAN POTASSIUM 50 MG PO TABS
100.0000 mg | ORAL_TABLET | Freq: Every day | ORAL | Status: DC
  Administered 2015-05-07: 100 mg via ORAL
  Filled 2015-05-06: qty 2

## 2015-05-06 MED ORDER — ROCURONIUM BROMIDE 100 MG/10ML IV SOLN
INTRAVENOUS | Status: DC | PRN
  Administered 2015-05-06: 50 mg via INTRAVENOUS

## 2015-05-06 MED ORDER — METOPROLOL SUCCINATE ER 50 MG PO TB24
50.0000 mg | ORAL_TABLET | Freq: Every morning | ORAL | Status: DC
  Administered 2015-05-07: 50 mg via ORAL
  Filled 2015-05-06 (×2): qty 1

## 2015-05-06 MED ORDER — HYDROMORPHONE HCL 1 MG/ML IJ SOLN: 0.2500 mg | INTRAMUSCULAR | Status: DC | PRN

## 2015-05-06 MED ORDER — PANTOPRAZOLE SODIUM 40 MG PO TBEC
40.0000 mg | DELAYED_RELEASE_TABLET | Freq: Every day | ORAL | Status: DC
  Administered 2015-05-07: 40 mg via ORAL
  Filled 2015-05-06: qty 1

## 2015-05-06 MED ORDER — LEVOTHYROXINE SODIUM 50 MCG PO TABS: 50.0000 ug | ORAL_TABLET | Freq: Every day | ORAL | Status: DC

## 2015-05-06 MED ORDER — DEXAMETHASONE SODIUM PHOSPHATE 4 MG/ML IJ SOLN
INTRAMUSCULAR | Status: DC | PRN
  Administered 2015-05-06: 8 mg via INTRAVENOUS

## 2015-05-06 MED ORDER — ONDANSETRON HCL 4 MG/2ML IJ SOLN
4.0000 mg | INTRAMUSCULAR | Status: DC | PRN
  Administered 2015-05-06: 4 mg via INTRAVENOUS
  Filled 2015-05-06: qty 2

## 2015-05-06 MED ORDER — SODIUM CHLORIDE 0.9 % IJ SOLN: 3.0000 mL | INTRAMUSCULAR | Status: DC | PRN

## 2015-05-06 MED ORDER — FLUTICASONE PROPIONATE 50 MCG/ACT NA SUSP
1.0000 | Freq: Every day | NASAL | Status: DC | PRN
  Filled 2015-05-06: qty 16

## 2015-05-06 MED ORDER — ACETAMINOPHEN 650 MG RE SUPP: 650.0000 mg | RECTAL | Status: DC | PRN

## 2015-05-06 MED ORDER — MIDAZOLAM HCL 2 MG/2ML IJ SOLN
INTRAMUSCULAR | Status: AC
  Filled 2015-05-06: qty 2

## 2015-05-06 MED ORDER — ASPIRIN EC 81 MG PO TBEC
81.0000 mg | DELAYED_RELEASE_TABLET | Freq: Two times a day (BID) | ORAL | Status: DC
  Administered 2015-05-06 – 2015-05-07 (×2): 81 mg via ORAL
  Filled 2015-05-06 (×2): qty 1

## 2015-05-06 MED ORDER — LEVALBUTEROL HCL 0.63 MG/3ML IN NEBU: 0.6300 mg | INHALATION_SOLUTION | RESPIRATORY_TRACT | Status: DC | PRN

## 2015-05-06 MED ORDER — LEVALBUTEROL TARTRATE 45 MCG/ACT IN AERO: 1.0000 | INHALATION_SPRAY | RESPIRATORY_TRACT | Status: DC | PRN

## 2015-05-06 MED ORDER — SENNA 8.6 MG PO TABS
1.0000 | ORAL_TABLET | Freq: Two times a day (BID) | ORAL | Status: DC
  Administered 2015-05-06 – 2015-05-07 (×2): 8.6 mg via ORAL
  Filled 2015-05-06 (×2): qty 1

## 2015-05-06 MED ORDER — FENTANYL CITRATE (PF) 100 MCG/2ML IJ SOLN
INTRAMUSCULAR | Status: DC | PRN
  Administered 2015-05-06 (×2): 50 ug via INTRAVENOUS
  Administered 2015-05-06: 150 ug via INTRAVENOUS

## 2015-05-06 MED ORDER — MENTHOL 3 MG MT LOZG: 1.0000 | LOZENGE | OROMUCOSAL | Status: DC | PRN

## 2015-05-06 MED ORDER — ROCURONIUM BROMIDE 50 MG/5ML IV SOLN
INTRAVENOUS | Status: AC
  Filled 2015-05-06: qty 1

## 2015-05-06 MED ORDER — SUGAMMADEX SODIUM 200 MG/2ML IV SOLN
INTRAVENOUS | Status: AC
  Filled 2015-05-06: qty 2

## 2015-05-06 MED ORDER — SODIUM CHLORIDE 0.9 % IV SOLN: 250.0000 mL | INTRAVENOUS | Status: DC

## 2015-05-06 MED ORDER — PROPOFOL 10 MG/ML IV BOLUS
INTRAVENOUS | Status: DC | PRN
  Administered 2015-05-06: 150 mg via INTRAVENOUS

## 2015-05-06 MED ORDER — LACTATED RINGERS IV SOLN
INTRAVENOUS | Status: DC | PRN
  Administered 2015-05-06: 08:00:00 via INTRAVENOUS

## 2015-05-06 MED ORDER — DIAZEPAM 5 MG PO TABS: 5.0000 mg | ORAL_TABLET | Freq: Four times a day (QID) | ORAL | Status: DC | PRN

## 2015-05-06 MED ORDER — LIDOCAINE HCL (CARDIAC) 20 MG/ML IV SOLN
INTRAVENOUS | Status: AC
  Filled 2015-05-06: qty 5

## 2015-05-06 MED ORDER — DEXAMETHASONE SODIUM PHOSPHATE 4 MG/ML IJ SOLN
INTRAMUSCULAR | Status: AC
  Filled 2015-05-06: qty 2

## 2015-05-06 MED ORDER — FENTANYL CITRATE (PF) 250 MCG/5ML IJ SOLN
INTRAMUSCULAR | Status: AC
  Filled 2015-05-06: qty 5

## 2015-05-06 MED ORDER — OXYCODONE-ACETAMINOPHEN 5-325 MG PO TABS: 1.0000 | ORAL_TABLET | ORAL | Status: DC | PRN

## 2015-05-06 MED ORDER — ARTIFICIAL TEARS OP OINT
TOPICAL_OINTMENT | OPHTHALMIC | Status: DC | PRN
  Administered 2015-05-06: 1 via OPHTHALMIC

## 2015-05-06 MED ORDER — CEFAZOLIN SODIUM 1-5 GM-% IV SOLN
1.0000 g | Freq: Three times a day (TID) | INTRAVENOUS | Status: AC
  Administered 2015-05-06 (×2): 1 g via INTRAVENOUS
  Filled 2015-05-06 (×2): qty 50

## 2015-05-06 MED ORDER — MIDAZOLAM HCL 5 MG/5ML IJ SOLN
INTRAMUSCULAR | Status: DC | PRN
  Administered 2015-05-06: 2 mg via INTRAVENOUS

## 2015-05-06 MED ORDER — PHENOL 1.4 % MT LIQD: 1.0000 | OROMUCOSAL | Status: DC | PRN

## 2015-05-06 MED ORDER — ASPIRIN EC 81 MG PO TBEC: 81.0000 mg | DELAYED_RELEASE_TABLET | Freq: Two times a day (BID) | ORAL | Status: DC

## 2015-05-06 MED ORDER — 0.9 % SODIUM CHLORIDE (POUR BTL) OPTIME
TOPICAL | Status: DC | PRN
  Administered 2015-05-06: 1000 mL

## 2015-05-06 MED ORDER — ARTIFICIAL TEARS OP OINT
TOPICAL_OINTMENT | OPHTHALMIC | Status: AC
  Filled 2015-05-06: qty 3.5

## 2015-05-06 MED ORDER — SODIUM CHLORIDE 0.9 % IJ SOLN
3.0000 mL | Freq: Two times a day (BID) | INTRAMUSCULAR | Status: DC
  Administered 2015-05-07: 3 mL via INTRAVENOUS

## 2015-05-06 MED ORDER — CEFAZOLIN SODIUM-DEXTROSE 2-3 GM-% IV SOLR
INTRAVENOUS | Status: AC
  Filled 2015-05-06: qty 50

## 2015-05-06 MED ORDER — SODIUM CHLORIDE 0.9 % IJ SOLN
INTRAMUSCULAR | Status: AC
  Filled 2015-05-06: qty 10

## 2015-05-06 MED ORDER — CEFAZOLIN SODIUM-DEXTROSE 2-3 GM-% IV SOLR
INTRAVENOUS | Status: DC | PRN
Start: 2015-05-06 — End: 2015-05-06
  Administered 2015-05-06: 2 g via INTRAVENOUS

## 2015-05-06 MED ORDER — MORPHINE SULFATE (PF) 2 MG/ML IV SOLN
1.0000 mg | INTRAVENOUS | Status: DC | PRN
  Administered 2015-05-06: 2 mg via INTRAVENOUS
  Administered 2015-05-06: 4 mg via INTRAVENOUS
  Administered 2015-05-06 – 2015-05-07 (×4): 2 mg via INTRAVENOUS
  Filled 2015-05-06 (×3): qty 1
  Filled 2015-05-06: qty 2
  Filled 2015-05-06: qty 1
  Filled 2015-05-06: qty 2

## 2015-05-06 MED ORDER — MAGNESIUM OXIDE 400 MG PO TABS: 400.0000 mg | ORAL_TABLET | Freq: Every day | ORAL | Status: DC

## 2015-05-06 MED ORDER — ONDANSETRON HCL 4 MG/2ML IJ SOLN
INTRAMUSCULAR | Status: DC | PRN
  Administered 2015-05-06: 4 mg via INTRAVENOUS

## 2015-05-06 MED ORDER — LIDOCAINE HCL (CARDIAC) 20 MG/ML IV SOLN
INTRAVENOUS | Status: DC | PRN
  Administered 2015-05-06: 100 mg via INTRAVENOUS

## 2015-05-06 MED ORDER — DEXTROSE 5 % IV SOLN
10.0000 mg | INTRAVENOUS | Status: DC | PRN
  Administered 2015-05-06: 10 ug/min via INTRAVENOUS

## 2015-05-06 MED ORDER — DEXAMETHASONE SODIUM PHOSPHATE 4 MG/ML IJ SOLN
4.0000 mg | Freq: Four times a day (QID) | INTRAMUSCULAR | Status: DC
  Administered 2015-05-06 – 2015-05-07 (×2): 4 mg via INTRAVENOUS

## 2015-05-06 MED ORDER — SODIUM CHLORIDE 0.9 % IV SOLN
INTRAVENOUS | Status: DC
  Administered 2015-05-06: 14:00:00 via INTRAVENOUS

## 2015-05-06 MED ORDER — MAGNESIUM OXIDE 400 (241.3 MG) MG PO TABS
400.0000 mg | ORAL_TABLET | Freq: Every day | ORAL | Status: DC
  Administered 2015-05-07: 400 mg via ORAL
  Filled 2015-05-06: qty 1

## 2015-05-06 MED ORDER — EPHEDRINE SULFATE 50 MG/ML IJ SOLN
INTRAMUSCULAR | Status: AC
  Filled 2015-05-06: qty 1

## 2015-05-06 MED ORDER — ONDANSETRON HCL 4 MG/2ML IJ SOLN: 4.0000 mg | Freq: Once | INTRAMUSCULAR | Status: DC | PRN

## 2015-05-06 MED ORDER — MUPIROCIN 2 % EX OINT
TOPICAL_OINTMENT | CUTANEOUS | Status: AC
  Administered 2015-05-06: 1
  Filled 2015-05-06: qty 22

## 2015-05-06 MED ORDER — DEXAMETHASONE 4 MG PO TABS
4.0000 mg | ORAL_TABLET | Freq: Four times a day (QID) | ORAL | Status: DC
  Administered 2015-05-06 – 2015-05-07 (×2): 4 mg via ORAL
  Filled 2015-05-06 (×4): qty 1

## 2015-05-06 MED ORDER — ACETAMINOPHEN 325 MG PO TABS: 650.0000 mg | ORAL_TABLET | ORAL | Status: DC | PRN

## 2015-05-06 SURGICAL SUPPLY — 70 items
BENZOIN TINCTURE PRP APPL 2/3 (GAUZE/BANDAGES/DRESSINGS) ×2 IMPLANT
BIT DRILL SM SPINE QC 12 (BIT) ×2 IMPLANT
BLADE ULTRA TIP 2M (BLADE) ×2 IMPLANT
BNDG GAUZE ELAST 4 BULKY (GAUZE/BANDAGES/DRESSINGS) ×4 IMPLANT
BUR BARREL STRAIGHT FLUTE 4.0 (BURR) IMPLANT
BUR MATCHSTICK NEURO 3.0 LAGG (BURR) ×2 IMPLANT
CANISTER SUCT 3000ML PPV (MISCELLANEOUS) ×2 IMPLANT
COVER MAYO STAND STRL (DRAPES) ×2 IMPLANT
DRAPE C-ARM 42X72 X-RAY (DRAPES) ×4 IMPLANT
DRAPE LAPAROTOMY 100X72 PEDS (DRAPES) ×2 IMPLANT
DRAPE MICROSCOPE LEICA (MISCELLANEOUS) ×2 IMPLANT
DRAPE POUCH INSTRU U-SHP 10X18 (DRAPES) ×2 IMPLANT
DRAPE PROXIMA HALF (DRAPES) ×2 IMPLANT
DRSG OPSITE POSTOP 3X4 (GAUZE/BANDAGES/DRESSINGS) ×2 IMPLANT
DURAPREP 6ML APPLICATOR 50/CS (WOUND CARE) ×2 IMPLANT
ELECT REM PT RETURN 9FT ADLT (ELECTROSURGICAL) ×2
ELECTRODE REM PT RTRN 9FT ADLT (ELECTROSURGICAL) ×1 IMPLANT
GAUZE SPONGE 4X4 12PLY STRL (GAUZE/BANDAGES/DRESSINGS) IMPLANT
GAUZE SPONGE 4X4 16PLY XRAY LF (GAUZE/BANDAGES/DRESSINGS) IMPLANT
GLOVE BIO SURGEON STRL SZ 6.5 (GLOVE) IMPLANT
GLOVE BIO SURGEON STRL SZ7 (GLOVE) IMPLANT
GLOVE BIO SURGEON STRL SZ7.5 (GLOVE) IMPLANT
GLOVE BIO SURGEON STRL SZ8 (GLOVE) IMPLANT
GLOVE BIO SURGEON STRL SZ8.5 (GLOVE) IMPLANT
GLOVE BIOGEL M 8.0 STRL (GLOVE) ×2 IMPLANT
GLOVE BIOGEL PI IND STRL 8 (GLOVE) ×2 IMPLANT
GLOVE BIOGEL PI INDICATOR 8 (GLOVE) ×2
GLOVE ECLIPSE 6.5 STRL STRAW (GLOVE) ×2 IMPLANT
GLOVE ECLIPSE 7.0 STRL STRAW (GLOVE) IMPLANT
GLOVE ECLIPSE 7.5 STRL STRAW (GLOVE) ×6 IMPLANT
GLOVE ECLIPSE 8.0 STRL XLNG CF (GLOVE) IMPLANT
GLOVE ECLIPSE 8.5 STRL (GLOVE) IMPLANT
GLOVE EXAM NITRILE LRG STRL (GLOVE) IMPLANT
GLOVE EXAM NITRILE MD LF STRL (GLOVE) IMPLANT
GLOVE EXAM NITRILE XL STR (GLOVE) IMPLANT
GLOVE EXAM NITRILE XS STR PU (GLOVE) IMPLANT
GLOVE INDICATOR 6.5 STRL GRN (GLOVE) IMPLANT
GLOVE INDICATOR 7.0 STRL GRN (GLOVE) IMPLANT
GLOVE INDICATOR 7.5 STRL GRN (GLOVE) IMPLANT
GLOVE INDICATOR 8.0 STRL GRN (GLOVE) IMPLANT
GLOVE INDICATOR 8.5 STRL (GLOVE) IMPLANT
GLOVE OPTIFIT SS 8.0 STRL (GLOVE) IMPLANT
GLOVE SURG SS PI 6.5 STRL IVOR (GLOVE) IMPLANT
GOWN STRL REUS W/ TWL LRG LVL3 (GOWN DISPOSABLE) ×2 IMPLANT
GOWN STRL REUS W/ TWL XL LVL3 (GOWN DISPOSABLE) IMPLANT
GOWN STRL REUS W/TWL 2XL LVL3 (GOWN DISPOSABLE) ×2 IMPLANT
GOWN STRL REUS W/TWL LRG LVL3 (GOWN DISPOSABLE) ×2
GOWN STRL REUS W/TWL XL LVL3 (GOWN DISPOSABLE)
HALTER HD/CHIN CERV TRACTION D (MISCELLANEOUS) IMPLANT
HEMOSTAT POWDER KIT SURGIFOAM (HEMOSTASIS) ×2 IMPLANT
KIT BASIN OR (CUSTOM PROCEDURE TRAY) ×2 IMPLANT
KIT ROOM TURNOVER OR (KITS) ×2 IMPLANT
NEEDLE SPNL 22GX3.5 QUINCKE BK (NEEDLE) ×2 IMPLANT
NS IRRIG 1000ML POUR BTL (IV SOLUTION) ×2 IMPLANT
PACK LAMINECTOMY NEURO (CUSTOM PROCEDURE TRAY) ×2 IMPLANT
PATTIES SURGICAL .5 X1 (DISPOSABLE) IMPLANT
PLATE ANT CERV 3LVL 39MM (Plate) ×2 IMPLANT
RUBBERBAND STERILE (MISCELLANEOUS) ×4 IMPLANT
SCREW SELF TAP VAR 4.2X12PRO (Screw) ×12 IMPLANT
SCREW SELF-TAP 4.6X12MM ANGLE (Screw) ×2 IMPLANT
SCREW SELF-TAP 4.6X14MM ANGLE (Screw) ×2 IMPLANT
SPACER CERVICAL FRGE 12X14X6-7 (Spacer) ×2 IMPLANT
SPACER CERVICAL FRGE 12X14X7-7 (Spacer) ×4 IMPLANT
SPONGE INTESTINAL PEANUT (DISPOSABLE) ×2 IMPLANT
SPONGE SURGIFOAM ABS GEL 100 (HEMOSTASIS) ×2 IMPLANT
STRIP CLOSURE SKIN 1/2X4 (GAUZE/BANDAGES/DRESSINGS) ×2 IMPLANT
SUT VIC AB 3-0 SH 8-18 (SUTURE) ×4 IMPLANT
TOWEL OR 17X24 6PK STRL BLUE (TOWEL DISPOSABLE) ×2 IMPLANT
TOWEL OR 17X26 10 PK STRL BLUE (TOWEL DISPOSABLE) ×2 IMPLANT
WATER STERILE IRR 1000ML POUR (IV SOLUTION) ×2 IMPLANT

## 2015-05-06 NOTE — Anesthesia Preprocedure Evaluation (Addendum)
Anesthesia Evaluation  Patient identified by MRN, date of birth, ID band Patient awake    Reviewed: Allergy & Precautions, NPO status , Patient's Chart, lab work & pertinent test results  Airway Mallampati: I  TM Distance: >3 FB Neck ROM: Full    Dental  (+) Dental Advisory Given, Teeth Intact   Pulmonary asthma ,    Pulmonary exam normal        Cardiovascular hypertension, Pt. on medications and Pt. on home beta blockers Normal cardiovascular exam     Neuro/Psych PSYCHIATRIC DISORDERS Depression    GI/Hepatic GERD  Medicated,  Endo/Other  Hypothyroidism Morbid obesity  Renal/GU      Musculoskeletal  (+) Arthritis ,   Abdominal   Peds  Hematology   Anesthesia Other Findings   Reproductive/Obstetrics                           Anesthesia Physical Anesthesia Plan  ASA: II  Anesthesia Plan: General   Post-op Pain Management:    Induction: Intravenous  Airway Management Planned: Oral ETT  Additional Equipment:   Intra-op Plan:   Post-operative Plan: Extubation in OR  Informed Consent: I have reviewed the patients History and Physical, chart, labs and discussed the procedure including the risks, benefits and alternatives for the proposed anesthesia with the patient or authorized representative who has indicated his/her understanding and acceptance.   Dental advisory given  Plan Discussed with: CRNA and Surgeon  Anesthesia Plan Comments:        Anesthesia Quick Evaluation

## 2015-05-06 NOTE — Anesthesia Procedure Notes (Signed)
Procedure Name: Intubation Date/Time: 05/06/2015 8:36 AM Performed by: Garrison Columbus T Pre-anesthesia Checklist: Patient identified, Emergency Drugs available, Suction available, Patient being monitored and Timeout performed Patient Re-evaluated:Patient Re-evaluated prior to inductionOxygen Delivery Method: Circle system utilized Preoxygenation: Pre-oxygenation with 100% oxygen Intubation Type: IV induction Ventilation: Mask ventilation without difficulty Laryngoscope Size: Mac and 3 Grade View: Grade II Tube type: Oral Tube size: 7.0 mm Number of attempts: 1 Airway Equipment and Method: Stylet Placement Confirmation: ETT inserted through vocal cords under direct vision,  positive ETCO2 and breath sounds checked- equal and bilateral Secured at: 22 cm Tube secured with: Tape Dental Injury: Teeth and Oropharynx as per pre-operative assessment

## 2015-05-06 NOTE — Progress Notes (Signed)
Orthopedic Tech Progress Note Patient Details:  Victoria Hudson 1945-10-20 [id number]  Ortho Devices Type of Ortho Device: Soft collar Ortho Device/Splint Location: neck Ortho Device/Splint Interventions: Application   [name] 05/06/2015, 12:10 PM

## 2015-05-06 NOTE — Anesthesia Postprocedure Evaluation (Signed)
Anesthesia Post Note  Patient: Victoria Hudson  Procedure(s) Performed: Procedure(s) (LRB): Cervical four-five, Cervical five-six, Cervical six-seven Anterior cervical decompression/diskectomy/fusion (N/A)  Patient location during evaluation: PACU Anesthesia Type: General Level of consciousness: awake and alert Pain management: pain level controlled Vital Signs Assessment: post-procedure vital signs reviewed and stable Respiratory status: spontaneous breathing, nonlabored ventilation, respiratory function stable and patient connected to nasal cannula oxygen Cardiovascular status: blood pressure returned to baseline and stable Postop Assessment: no signs of nausea or vomiting Anesthetic complications: no    Last Vitals:  Filed Vitals:   05/06/15 1205 05/06/15 1215  BP: 148/71   Pulse: 83 82  Temp:  36.8 C  Resp: 23 21    Last Pain: There were no vitals filed for this visit.               [place]

## 2015-05-06 NOTE — H&P (Signed)
Victoria Hudson is an 69 y.o. female.   Chief Complaint: neck and bilateral arm pain HPI: patient who  Has seen me for lumbar pain with a discectomy in the past. For several months she has had neck pain with radiation to both upper extremities with sensory changes, no better with coneservative  Treatment. She wants to go ahead with surgical pain relief with a 3 level discectomy.  Past Medical History  Diagnosis Date  . Thyroid disease   . Hypothyroidism   . Complication of anesthesia 1968    "fought the anesthesia"  . Asthma     " allergies"  . Hypertension   . GERD (gastroesophageal reflux disease)   . Depression   . Arthritis     Past Surgical History  Procedure Laterality Date  . Abdominal hysterectomy    . Laminectomy      L 4 and 5; 2 separate surgeries  . Right hand      3 surgeries- carpal tunel  . Left hand      carpal tunel  . Knee arthroscopy Right     torn ligament  . Elbow arthroscopy Right     right  . Foot ganglion excision Left     left  . Dilation and curettage of uterus  1968  . Colonoscopy  2014  . Colonoscopy w/ polypectomy    . Breast surgery Bilateral     biopsy- 2 different times    Family History  Problem Relation Age of Onset  . Colon cancer Neg Hx    Social History:  reports that she has never smoked. She has never used smokeless tobacco. She reports that she does not drink alcohol or use illicit drugs.  Allergies:  Allergies  Allergen Reactions  . Beta Adrenergic Blockers Other (See Comments)    "Have voice problem"  . Mobic [Meloxicam] Hypertension    Made blood pressure extremely high  . Sulfa Antibiotics Swelling    And itching  . Tape Rash  . Aspirin Other (See Comments)    Stomach burning; increased acid  . Oxycodone Other (See Comments)    Hallucinations, knocks her out    Medications Prior to Admission  Medication Sig Dispense Refill  . aspirin EC 81 MG tablet Take 81 mg by mouth 2 (two) times daily.    . B  Complex-C-Folic Acid (B COMPLEX + C TR PO) Take 1 tablet by mouth daily.     . Cholecalciferol (D3 SUPER STRENGTH) 2000 UNITS CAPS Take 1 capsule by mouth daily.    . cyclobenzaprine (FLEXERIL) 10 MG tablet Take 10 mg by mouth every evening.     . estazolam (PROSOM) 2 MG tablet Take 2 mg by mouth at bedtime.    . Glucosamine 500 MG TABS Take by mouth 2 (two) times daily.    Marland Kitchen ibuprofen (ADVIL,MOTRIN) 200 MG tablet Take 400 mg by mouth every 6 (six) hours as needed.    . levalbuterol (XOPENEX HFA) 45 MCG/ACT inhaler Inhale 1 puff into the lungs every 4 (four) hours as needed for wheezing.    Marland Kitchen levothyroxine (SYNTHROID, LEVOTHROID) 50 MCG tablet Take 50 mcg by mouth every morning.     Marland Kitchen losartan (COZAAR) 100 MG tablet Take 100 mg by mouth daily.    . magnesium oxide (MAG-OX) 400 MG tablet Take 400 mg by mouth daily.    . metoprolol succinate (TOPROL-XL) 50 MG 24 hr tablet Take 50 mg by mouth every morning. Take with or immediately following a  meal.    . omeprazole (PRILOSEC) 20 MG capsule Take 20 mg by mouth every morning.     . traMADol (ULTRAM) 50 MG tablet Take 1 tablet (50 mg total) by mouth every 6 (six) hours as needed for pain. 15 tablet 1  . calcium carbonate (OS-CAL) 600 MG TABS Take 600 mg by mouth every evening.     . Feverfew 380 MG CAPS Take 1 capsule by mouth daily.     . fluticasone (FLONASE) 50 MCG/ACT nasal spray Place 1 spray into both nostrils daily as needed for allergies or rhinitis.    . mometasone (NASONEX) 50 MCG/ACT nasal spray Place 2 sprays into the nose daily as needed (allergies).    Marland Kitchen oxyCODONE-acetaminophen (PERCOCET) 7.5-325 MG per tablet Take 1 tablet by mouth every 4 (four) hours as needed for pain. 30 tablet 0    Results for orders placed or performed during the hospital encounter of 05/06/15 (from the past 48 hour(s))  CBC     Status: None   Collection Time: 05/06/15  7:19 AM  Result Value Ref Range   WBC 6.5 4.0 - 10.5 K/uL   RBC 4.26 3.87 - 5.11 MIL/uL    Hemoglobin 12.7 12.0 - 15.0 g/dL   HCT 37.7 36.0 - 46.0 %   MCV 88.5 78.0 - 100.0 fL   MCH 29.8 26.0 - 34.0 pg   MCHC 33.7 30.0 - 36.0 g/dL   RDW 13.5 11.5 - 15.5 %   Platelets 229 150 - 400 K/uL   No results found.  Review of Systems  Constitutional: Negative.   Eyes: Negative.   Cardiovascular: Negative.   Gastrointestinal: Negative.   Genitourinary: Negative.   Musculoskeletal: Positive for back pain and neck pain.  Skin: Negative.   Endo/Heme/Allergies: Negative.   Psychiatric/Behavioral: Negative.     Blood pressure 152/92, pulse 67, temperature 98.2 F (36.8 C), temperature source Oral, resp. rate 20, height 5\' 2"  (1.575 Hudson), weight 76.204 kg (168 lb), SpO2 100 %. Physical Exam hent, nl. Neck, pain with mobility. Cv, nl. Lungs, clear. Abdomen, nl. Extremities, nl, NEURO weakness ob biceps, sensory changes in both arms, dtr, nl.lumbar femoral stretch maneuver positive on the right  Assessment/Plan Patient to go ahead with ACDF TO 45,56, 67. SHE AND HER FAMILIY ARE AWARE OF RISKS AND BENEFITS. THE SURGERY WILL NOT IMPROVE HER LUMBAR RADICULOPATHY SYMPTOMS  Victoria Hudson 05/06/2015, 7:45 AM

## 2015-05-06 NOTE — Evaluation (Signed)
Physical Therapy Evaluation and Discharge Patient Details Name: Victoria Hudson MRN: [id number] DOB: [birth year] Today's Date: 05/06/2015   History of Present Illness  69 y.o. female s/p Cervical four-five, Cervical five-six, Cervical six-seven Anterior cervical decompression/diskectomy/fusion.  Clinical Impression  Patient evaluated by Physical Therapy with no further acute PT needs identified. All education has been completed and the patient has no further questions. Reviewed safety with mobility including precautions and log roll technique for cervical protection. Safely ambulating up to 250 feet with minimal support on a rolling walker (for pt comfort) and completed stair training without difficulty. To be seen by OT tomorrow. Adequate for D/c from PT standpoint. Has strong family support at home. See below for any follow-up Physical Therapy or equipment needs. PT is signing off. Thank you for this referral.     Follow Up Recommendations No PT follow up    Equipment Recommendations  None recommended by PT (May need tub bench - See OT recs.)    Recommendations for Other Services       Precautions / Restrictions Precautions Precautions: Cervical Required Braces or Orthoses: Cervical Brace Cervical Brace: Soft collar Restrictions Weight Bearing Restrictions: No      Mobility  Bed Mobility Overal bed mobility: Needs Assistance Bed Mobility: Rolling;Sidelying to Sit;Sit to Sidelying Rolling: Supervision Sidelying to sit: Supervision     Sit to sidelying: Supervision General bed mobility comments: Educated on log roll technique. VC for use of rail as needed. Performed without physical assist.  Transfers Overall transfer level: Needs assistance Equipment used: Rolling walker (2 wheeled) Transfers: Sit to/from Stand Sit to Stand: Supervision         General transfer comment: Supervision for safety. VC for hand placement good control without loss of  balance.  Ambulation/Gait Ambulation/Gait assistance: Supervision Ambulation Distance (Feet): 250 Feet Assistive device: Rolling walker (2 wheeled) Gait Pattern/deviations: Step-through pattern;Decreased stride length Gait velocity: decreased Gait velocity interpretation: Below normal speed for age/gender General Gait Details: Educated on DME use with a rolling walker for support. No buckling or loss of balance noted. Slightly balanced and prefers to hold RW for security. Good control with turns and backing up.  Stairs Stairs: Yes Stairs assistance: Supervision Stair Management: One rail Left;Step to pattern;Forwards Number of Stairs: 4 General stair comments: VC for sequencing and technique. Supervision for safety. Performed without loss of balance.  Wheelchair Mobility    Modified Rankin (Stroke Patients Only)       Balance Overall balance assessment: Modified Independent (guarded, prefers RW for support)                                           Pertinent Vitals/Pain Pain Assessment: 0-10 Pain Score: 4  Pain Location: neck Pain Descriptors / Indicators: Aching Pain Intervention(s): Monitored during session;Repositioned    Home Living Family/patient expects to be discharged to:: Private residence Living Arrangements: Spouse/significant other Available Help at Discharge: Family;Available 24 hours/day Type of Home: House Home Access: Stairs to enter Entrance Stairs-Rails: Left Entrance Stairs-Number of Steps: 3 Home Layout: One level Home Equipment: Walker - 2 wheels;Grab bars - tub/shower      Prior Function Level of Independence: Independent               Hand Dominance   Dominant Hand: Left    Extremity/Trunk Assessment   Upper Extremity Assessment: Defer to OT evaluation  Lower Extremity Assessment: Overall WFL for tasks assessed         Communication   Communication: No difficulties  Cognition  Arousal/Alertness: Awake/alert Behavior During Therapy: WFL for tasks assessed/performed Overall Cognitive Status: Within Functional Limits for tasks assessed                      General Comments General comments (skin integrity, edema, etc.): Reviewed precautions and safety with mobility post-op cervical spine surgery.    Exercises        Assessment/Plan    PT Assessment Patent does not need any further PT services  PT Diagnosis Abnormality of gait;Acute pain   PT Problem List    PT Treatment Interventions     PT Goals (Current goals can be found in the Care Plan section) Acute Rehab PT Goals Patient Stated Goal: None stated PT Goal Formulation: All assessment and education complete, DC therapy    Frequency     Barriers to discharge        Co-evaluation               End of Session Equipment Utilized During Treatment: Cervical collar Activity Tolerance: Patient tolerated treatment well Patient left: in bed;with call bell/phone within reach;with family/visitor present;with SCD's reapplied Nurse Communication: Mobility status    Functional Assessment Tool Used: Clinical Observation Functional Limitation: Mobility: Walking and moving around Mobility: Walking and Moving Around Current Status [id number]): At least 1 percent but less than 20 percent impaired, limited or restricted Mobility: Walking and Moving Around Goal Status [phone]): At least 1 percent but less than 20 percent impaired, limited or restricted Mobility: Walking and Moving Around Discharge Status [phone]): At least 1 percent but less than 20 percent impaired, limited or restricted    Time: [id number] PT Time Calculation (min) (ACUTE ONLY): 21 min   Charges:   PT Evaluation $Initial PT Evaluation Tier I: 1 Procedure     PT G Codes:   PT G-Codes **NOT FOR INPATIENT CLASS** Functional Assessment Tool Used: Clinical Observation Functional Limitation: Mobility: Walking and moving  around Mobility: Walking and Moving Around Current Status [id number]): At least 1 percent but less than 20 percent impaired, limited or restricted Mobility: Walking and Moving Around Goal Status [phone]): At least 1 percent but less than 20 percent impaired, limited or restricted Mobility: Walking and Moving Around Discharge Status [phone]): At least 1 percent but less than 20 percent impaired, limited or restricted    Ellouise Newer 05/06/2015, 6:03 PM  Camille Bal Jaconita, [place]

## 2015-05-06 NOTE — Transfer of Care (Signed)
Immediate Anesthesia Transfer of Care Note  Patient: Victoria Hudson  Procedure(s) Performed: Procedure(s) with comments: Cervical four-five, Cervical five-six, Cervical six-seven Anterior cervical decompression/diskectomy/fusion (N/A) - C4-5 C5-6 C6-7 Anterior cervical decompression/diskectomy/fusion  Patient Location: PACU  Anesthesia Type:General  Level of Consciousness: awake, alert  and patient cooperative  Airway & Oxygen Therapy: Patient Spontanous Breathing  Post-op Assessment: Report given to RN, Post -op Vital signs reviewed and stable and Patient moving all extremities X 4  Post vital signs: Reviewed and stable  Last Vitals:  Filed Vitals:   05/06/15 0720 05/06/15 1121  BP: 152/92 98/74  Pulse: 67 87  Temp: 36.8 C 36.9 C  Resp: 20 22    Complications: No apparent anesthesia complications

## 2015-05-07 ENCOUNTER — Encounter (HOSPITAL_COMMUNITY): Payer: Self-pay | Admitting: Neurosurgery

## 2015-05-07 DIAGNOSIS — M542 Cervicalgia: Secondary | ICD-10-CM | POA: Diagnosis present

## 2015-05-07 DIAGNOSIS — K219 Gastro-esophageal reflux disease without esophagitis: Secondary | ICD-10-CM | POA: Diagnosis present

## 2015-05-07 DIAGNOSIS — M4802 Spinal stenosis, cervical region: Secondary | ICD-10-CM | POA: Diagnosis present

## 2015-05-07 DIAGNOSIS — Z7982 Long term (current) use of aspirin: Secondary | ICD-10-CM | POA: Diagnosis not present

## 2015-05-07 DIAGNOSIS — Z79899 Other long term (current) drug therapy: Secondary | ICD-10-CM | POA: Diagnosis not present

## 2015-05-07 DIAGNOSIS — I10 Essential (primary) hypertension: Secondary | ICD-10-CM | POA: Diagnosis present

## 2015-05-07 DIAGNOSIS — E039 Hypothyroidism, unspecified: Secondary | ICD-10-CM | POA: Diagnosis present

## 2015-05-07 DIAGNOSIS — M4722 Other spondylosis with radiculopathy, cervical region: Secondary | ICD-10-CM | POA: Diagnosis present

## 2015-05-07 NOTE — Progress Notes (Signed)
Occupational Therapy Evaluation and Discharge Patient Details Name: MYSTERY VILLATORO MRN: [id number] DOB: September 17, 1945 Today's Date: 05/07/2015    History of Present Illness 69 y.o. female s/p C4-5, C5-6, C6-7 anterior cervical decompression/diskectomy/fusion.   Clinical Impression   All education completed and pt has no further questions. Reviewed safety and fall prevention strategies. Pt demonstrated good log roll technique and adherence to cervical precautions during ADL tasks. Pt completed tub transfer with supervision level assist, but is requesting a shower seat for safety during bathing. Pt has good family support 24/7 and is adequate for d/c from OT standpoint. Pt with no further acute OT needs identified.OT is signing off.     Follow Up Recommendations  No OT follow up;Supervision - Intermittent    Equipment Recommendations  Tub/shower seat    Recommendations for Other Services       Precautions / Restrictions Precautions Precautions: Cervical Precaution Booklet Issued: Yes (comment) Precaution Comments: Reviewed precautions. Pt able to recall 3/3 cervical precautions at end of session Required Braces or Orthoses: Cervical Brace Cervical Brace: Soft collar Restrictions Weight Bearing Restrictions: No      Mobility Bed Mobility Overal bed mobility: Needs Assistance Bed Mobility: Rolling;Sidelying to Sit Rolling: Supervision Sidelying to sit: Supervision       General bed mobility comments: HOB and no bedrails to simulate home environment. Good demonstration of log roll technique. Appropriate hand placement. Supervision for safety as pt reported lightheadedness from pain medication.   Transfers Overall transfer level: Needs assistance Equipment used: Rolling walker (2 wheeled) Transfers: Sit to/from Stand Sit to Stand: Supervision         General transfer comment: Supervision for safety. Pt demonstrating safe hand placement on seated surfaces and RW.    Balance Overall balance assessment: Needs assistance Sitting-balance support: No upper extremity supported;Feet supported Sitting balance-Leahy Scale: Good     Standing balance support: No upper extremity supported;During functional activity Standing balance-Leahy Scale: Good                              ADL Overall ADL's : Needs assistance/impaired     Grooming: Wash/dry hands;Supervision/safety;Standing               Lower Body Dressing: Supervision/safety;Sit to/from stand   Toilet Transfer: Supervision/safety;Ambulation;Regular Toilet;Grab bars   Toileting- Clothing Manipulation and Hygiene: Supervision/safety;Adhering to back precautions;Sit to/from stand   Tub/ Shower Transfer: Tub transfer;Supervision/safety;Adhering to back precautions;Ambulation;Grab bars;Rolling walker   Functional mobility during ADLs: Supervision/safety;Rolling walker General ADL Comments: Pt completed all ADL at supervision level with no LOB or buckling. Pt demonstrates safe hand placement and use of DME during functional tasks. Pt reported some light-headedness that she attributed to pain medication. Educated pt on fall prevention strategies (removing throw rugs, being careful with small grandchildren, and moving electrical cords). Pt verbalized and demonstrated adherence to cervical precautions.     Vision Vision Assessment?: No apparent visual deficits   Perception     Praxis      Pertinent Vitals/Pain Pain Assessment: 0-10 Pain Score: 4  Pain Location: Neck Pain Descriptors / Indicators: Aching;Operative site guarding Pain Intervention(s): Limited activity within patient's tolerance;Monitored during session;Premedicated before session;Repositioned     Hand Dominance Left   Extremity/Trunk Assessment Upper Extremity Assessment Upper Extremity Assessment: Overall WFL for tasks assessed   Lower Extremity Assessment Lower Extremity Assessment: Overall WFL for tasks  assessed   Cervical / Trunk Assessment Cervical / Trunk Assessment: Normal  Communication Communication Communication: No difficulties   Cognition Arousal/Alertness: Awake/alert Behavior During Therapy: WFL for tasks assessed/performed Overall Cognitive Status: Within Functional Limits for tasks assessed                     General Comments       Exercises       Shoulder Instructions      Home Living Family/patient expects to be discharged to:: Private residence Living Arrangements: Spouse/significant other Available Help at Discharge: Family;Available 24 hours/day Type of Home: House Home Access: Stairs to enter [employer] of Steps: 3 Entrance Stairs-Rails: Left Home Layout: One level     Bathroom Shower/Tub: Tub/shower unit Shower/tub characteristics: Door Bathroom Toilet: Handicapped height     Home Equipment: Environmental consultant - 2 wheels;Cane - single point;Grab bars - tub/shower;Hand held shower head;Other (comment) Management consultant)          Prior Functioning/Environment Level of Independence: Independent             OT Diagnosis: Acute pain   OT Problem List: Decreased strength;Decreased range of motion;Decreased activity tolerance;Decreased knowledge of use of DME or AE;Decreased safety awareness;Pain   OT Treatment/Interventions:      OT Goals(Current goals can be found in the care plan section) Acute Rehab OT Goals Patient Stated Goal: To be able to start exercising again OT Goal Formulation: With patient Time For Goal Achievement: 05/21/15 Potential to Achieve Goals: Good  OT Frequency:     Barriers to D/C:            Co-evaluation              End of Session Equipment Utilized During Treatment: Gait belt;Rolling walker;Cervical collar Nurse Communication: Mobility status;Other (comment);Precautions (IV alarm)  Activity Tolerance: Patient tolerated treatment well Patient left: in chair;with call bell/phone within reach;with  family/visitor present   Time: [id number] OT Time Calculation (min): 35 min Charges:  OT General Charges $OT Visit: 1 Procedure OT Evaluation $Initial OT Evaluation Tier I: 1 Procedure OT Treatments $Self Care/Home Management : 8-22 mins G-Codes: OT G-codes **NOT FOR INPATIENT CLASS** Functional Assessment Tool Used: Clinical judgement Functional Limitation: Self care Self Care Current Status [id number]): At least 1 percent but less than 20 percent impaired, limited or restricted Self Care Goal Status [id number]): At least 1 percent but less than 20 percent impaired, limited or restricted Self Care Discharge Status [phone]): At least 1 percent but less than 20 percent impaired, limited or restricted  Redmond Baseman 05/07/2015, 9:44 AM

## 2015-05-07 NOTE — Op Note (Signed)
NAMEASAKO, Victoria Hudson NO.:  [id number]  MEDICAL RECORD NO.:  [id number]  LOCATION:  5C21C                        FACILITY:  [facility]  PHYSICIAN:  Leeroy Cha, M.D.   DATE OF BIRTH:  [birth year]  DATE OF PROCEDURE:  05/06/2015 DATE OF DISCHARGE:                              OPERATIVE REPORT   PREOPERATIVE DIAGNOSES: 1. Cervical 4-5, 5-6, 6-7 stenosis with chronic radiculopathy. 2. History of back pain with left-sided radiculopathy.  POSTOPERATIVE DIAGNOSES: 1. Cervical 4-5, 5-6, 6-7 stenosis with chronic radiculopathy. 2. History of back pain with left-sided radiculopathy.  PROCEDURE:  Anterior 4-5, 5-6, 6-7 diskectomy, decompression of the spinal cord, bilateral foraminotomy, interbody fusion with allograft plate, microscope.  SURGEON:  Leeroy Cha, M.D.  ASSISTANT:  Dr. Christella Noa.  CLINICAL HISTORY:  [name] is a lady who in the past underwent surgery of the lumbar area.  She had been complaining of neck pain radiating to both upper extremity, associated with a burning sensation, was unable to sleep, weakness.  X-rays showed spondylosis for cervical 4 to 7.  The patient failed with conservative treatment.  She wanted to undergo surgery.  DESCRIPTION OF PROCEDURE:  The patient was taken to the OR and after intubation, the left side of the neck was cleaned with DuraPrep. Transverse incision was made through the skin, subcutaneous tissue. Dissection was carried out.  First x-ray showed that indeed we were right at the level of C4-C5.  Immediately there was no space, we tried to introduce a flat knife, but it was difficult, so we had to drill the anterior ligament to be able to go into the disk space which was quite calcified.  Diskectomy was done.  The posterior ligament also was calcified and the patient has__________ spondylosis central and lateral. Decompression of the spinal cord as well as both C5 nerve root was done. The same procedure with  the same finding was done at the level of 5-6 and 6-7 with decompression of the spinal cord and the C6 and C7 nerve root.  From then on, the endplate were drilled.  An allograft of 6 mm was introduced at the level of 4-5 and 2 other one of 7 mm at the level of 5, C6-7 followed by a plate with 8 screws. Lateral cervical spine showed good position of the graft and the plate. The area was irrigated.  We waited 10 minutes to be sure that the patient had good hemostasis, and from then on, the wound was closed with Vicryl and Steri-Strips.          ______________________________ Leeroy Cha, M.D.     EB/MEDQ  D:  05/06/2015  T:  05/07/2015  Job:  [id number]

## 2015-05-07 NOTE — Care Management Note (Signed)
Case Management Note  Patient Details  Name: Victoria Hudson MRN: [id number] Date of Birth: 29-Mar-1946  Subjective/Objective:                    Action/Plan: Patient admitted with cervical stenosis and underwent a C4-7 ACDF. Patient is being discharged home today with self care. No further needs per CM.  Expected Discharge Date:                  Expected Discharge Plan:  Home/Self Care  In-House Referral:     Discharge planning Services     Post Acute Care Choice:    Choice offered to:     DME Arranged:    DME Agency:     HH Arranged:    [place] Agency:     Status of Service:  Completed, signed off  Medicare Important Message Given:    Date Medicare IM Given:    Medicare IM give by:    Date Additional Medicare IM Given:    Additional Medicare Important Message give by:     If discussed at [place] of Stay Meetings, dates discussed:    Additional Comments:  Pollie Friar, RN 05/07/2015, 10:06 AM

## 2015-05-07 NOTE — Progress Notes (Signed)
Patient is discharged from room 5C21 at this time. Alert and in stable condition. IV site d/c'd. Instructions read to patient and family and understanding verbalized. Left unit via wheelchair with all belongings at side.

## 2015-05-07 NOTE — Discharge Summary (Signed)
Physician Discharge Summary  Patient ID: SHARNIE MISEK MRN: [id number] DOB/AGE: 71-Aug-1947 71 y.o.  Admit date: 05/06/2015 Discharge date: 05/07/2015  Admission Diagnoses:cervical stenosis  Discharge Diagnoses:  Active Problems:   Cervical stenosis of spinal canal   Discharged Condition: no pain  Hospital Course: surgery  Consults: none  Significant Diagnostic Studies: myelogram  Treatments: cervical fusion 4 to 7  Discharge Exam: Blood pressure 144/72, pulse 98, temperature 98.6 F (37 C), temperature source Oral, resp. rate 18, height 5\' 2"  (1.575 m), weight 76.204 kg (168 lb), SpO2 94 %. No pain  Disposition: 01-Home or Self Care     Medication List    ASK your doctor about these medications        aspirin EC 81 MG tablet  Take 81 mg by mouth 2 (two) times daily.     B COMPLEX + C TR PO  Take 1 tablet by mouth daily.     calcium carbonate 600 MG Tabs tablet  Commonly known as:  OS-CAL  Take 600 mg by mouth every evening.     cyclobenzaprine 10 MG tablet  Commonly known as:  FLEXERIL  Take 10 mg by mouth every evening.     D3 SUPER STRENGTH 2000 UNITS Caps  Generic drug:  Cholecalciferol  Take 1 capsule by mouth daily.     estazolam 2 MG tablet  Commonly known as:  PROSOM  Take 2 mg by mouth at bedtime.     Feverfew 380 MG Caps  Take 1 capsule by mouth daily.     fluticasone 50 MCG/ACT nasal spray  Commonly known as:  FLONASE  Place 1 spray into both nostrils daily as needed for allergies or rhinitis.     Glucosamine 500 MG Tabs  Take by mouth 2 (two) times daily.     ibuprofen 200 MG tablet  Commonly known as:  ADVIL,MOTRIN  Take 400 mg by mouth every 6 (six) hours as needed.     levalbuterol 45 MCG/ACT inhaler  Commonly known as:  XOPENEX HFA  Inhale 1 puff into the lungs every 4 (four) hours as needed for wheezing.     levothyroxine 50 MCG tablet  Commonly known as:  SYNTHROID, LEVOTHROID  Take 50 mcg by mouth every morning.     losartan 100 MG tablet  Commonly known as:  COZAAR  Take 100 mg by mouth daily.     magnesium oxide 400 MG tablet  Commonly known as:  MAG-OX  Take 400 mg by mouth daily.     metoprolol succinate 50 MG 24 hr tablet  Commonly known as:  TOPROL-XL  Take 50 mg by mouth every morning. Take with or immediately following a meal.     mometasone 50 MCG/ACT nasal spray  Commonly known as:  NASONEX  Place 2 sprays into the nose daily as needed (allergies).     omeprazole 20 MG capsule  Commonly known as:  PRILOSEC  Take 20 mg by mouth every morning.     oxyCODONE-acetaminophen 7.5-325 MG tablet  Commonly known as:  PERCOCET  Take 1 tablet by mouth every 4 (four) hours as needed for pain.     traMADol 50 MG tablet  Commonly known as:  ULTRAM  Take 1 tablet (50 mg total) by mouth every 6 (six) hours as needed for pain.         Signed: Floyce Stakes 05/07/2015, 9:58 AM

## 2015-05-08 ENCOUNTER — Encounter (HOSPITAL_COMMUNITY): Payer: Self-pay | Admitting: Neurosurgery

## 2015-05-16 ENCOUNTER — Emergency Department (HOSPITAL_COMMUNITY)
Admission: EM | Admit: 2015-05-16 | Discharge: 2015-05-16 | Disposition: A | Discharge status: Home / Self Care | Payer: PPO | Attending: Emergency Medicine | Admitting: Emergency Medicine

## 2015-05-16 ENCOUNTER — Encounter (HOSPITAL_COMMUNITY): Payer: Self-pay | Admitting: Emergency Medicine

## 2015-05-16 ENCOUNTER — Emergency Department (HOSPITAL_COMMUNITY): Payer: PPO

## 2015-05-16 DIAGNOSIS — R0789 Other chest pain: Secondary | ICD-10-CM | POA: Insufficient documentation

## 2015-05-16 DIAGNOSIS — F329 Major depressive disorder, single episode, unspecified: Secondary | ICD-10-CM | POA: Insufficient documentation

## 2015-05-16 DIAGNOSIS — I10 Essential (primary) hypertension: Secondary | ICD-10-CM | POA: Insufficient documentation

## 2015-05-16 DIAGNOSIS — M1990 Unspecified osteoarthritis, unspecified site: Secondary | ICD-10-CM | POA: Diagnosis not present

## 2015-05-16 DIAGNOSIS — E039 Hypothyroidism, unspecified: Secondary | ICD-10-CM | POA: Diagnosis not present

## 2015-05-16 DIAGNOSIS — Z7982 Long term (current) use of aspirin: Secondary | ICD-10-CM | POA: Insufficient documentation

## 2015-05-16 DIAGNOSIS — Z79899 Other long term (current) drug therapy: Secondary | ICD-10-CM | POA: Diagnosis not present

## 2015-05-16 DIAGNOSIS — M797 Fibromyalgia: Secondary | ICD-10-CM | POA: Insufficient documentation

## 2015-05-16 DIAGNOSIS — K219 Gastro-esophageal reflux disease without esophagitis: Secondary | ICD-10-CM | POA: Insufficient documentation

## 2015-05-16 DIAGNOSIS — R079 Chest pain, unspecified: Secondary | ICD-10-CM | POA: Diagnosis present

## 2015-05-16 DIAGNOSIS — J45909 Unspecified asthma, uncomplicated: Secondary | ICD-10-CM | POA: Diagnosis not present

## 2015-05-16 LAB — CBC WITH DIFFERENTIAL/PLATELET
Basophils Absolute: 0 10*3/uL (ref 0.0–0.1)
Basophils Relative: 0 %
Eosinophils Absolute: 0 10*3/uL (ref 0.0–0.7)
Eosinophils Relative: 0 %
HCT: 37.3 % (ref 36.0–46.0)
Hemoglobin: 12.7 g/dL (ref 12.0–15.0)
Lymphocytes Relative: 30 %
Lymphs Abs: 2.3 10*3/uL (ref 0.7–4.0)
MCH: 30.4 pg (ref 26.0–34.0)
MCHC: 34 g/dL (ref 30.0–36.0)
MCV: 89.2 fL (ref 78.0–100.0)
Monocytes Absolute: 0.5 10*3/uL (ref 0.1–1.0)
Monocytes Relative: 7 %
Neutro Abs: 4.7 10*3/uL (ref 1.7–7.7)
Neutrophils Relative %: 63 %
Platelets: 313 10*3/uL (ref 150–400)
RBC: 4.18 MIL/uL (ref 3.87–5.11)
RDW: 12.8 % (ref 11.5–15.5)
WBC: 7.5 10*3/uL (ref 4.0–10.5)

## 2015-05-16 LAB — BASIC METABOLIC PANEL
Anion gap: 6 (ref 5–15)
BUN: 9 mg/dL (ref 6–20)
CO2: 28 mmol/L (ref 22–32)
Calcium: 9.3 mg/dL (ref 8.9–10.3)
Chloride: 102 mmol/L (ref 101–111)
Creatinine, Ser: 0.83 mg/dL (ref 0.44–1.00)
GFR calc Af Amer: >60 mL/min (ref >60)
GFR calc non Af Amer: >60 mL/min (ref >60)
Glucose, Bld: 116 mg/dL — ABNORMAL HIGH (ref 65–99)
Potassium: 4.3 mmol/L (ref 3.5–5.1)
Sodium: 136 mmol/L (ref 135–145)

## 2015-05-16 LAB — I-STAT TROPONIN, ED: Troponin i, poc: 0 ng/mL (ref 0.00–0.08)

## 2015-05-16 MED ORDER — DOCUSATE SODIUM 100 MG PO CAPS: 100.0000 mg | ORAL_CAPSULE | Freq: Two times a day (BID) | ORAL | Status: DC

## 2015-05-16 MED ORDER — OXYCODONE-ACETAMINOPHEN 5-325 MG PO TABS: 1.0000 | ORAL_TABLET | Freq: Four times a day (QID) | ORAL | Status: DC | PRN

## 2015-05-16 MED ORDER — FENTANYL CITRATE (PF) 100 MCG/2ML IJ SOLN
50.0000 ug | Freq: Once | INTRAMUSCULAR | Status: AC
  Administered 2015-05-16: 50 ug via INTRAVENOUS
  Filled 2015-05-16: qty 2

## 2015-05-16 NOTE — ED Notes (Signed)
Water given to pt 

## 2015-05-16 NOTE — ED Notes (Signed)
Pt comfortable with discharge and follow up instructions. Prescriptions x2. 

## 2015-05-16 NOTE — ED Notes (Signed)
MD at bedside. 

## 2015-05-16 NOTE — ED Notes (Signed)
Pt states that she started having pain in her neck and the right side of her chest starting on Saturday, worsening on Wednesday. Pt had surgery on her neck to remove bone spurs approx 2 weeks ago. Pt received 170mcg Fentanyl en route.

## 2015-05-16 NOTE — Discharge Instructions (Signed)

## 2015-05-16 NOTE — ED Provider Notes (Signed)
By signing my name below, I, Evelene Croon, attest that this documentation has been prepared under the direction and in the presence of [place], DO . Electronically Signed: Evelene Croon, Scribe. 05/16/2015. 4:11 AM.   TIME SEEN: 3:53 AM   CHIEF COMPLAINT: CP; Neck Pain    HPI:   HPI Comments:  Victoria Hudson is a 69 y.o. female with a history of C4-C7 fusion on 05/06/2015 by Dr. Joya Salm for history of cervical stenosis with chronic radiculopathy, who presents to the Emergency Department complaining of moderate right shoulder pain, and right sided chest pain that radiates through to her right upper back which states began after her discharge and has aggressively worsened. states the pain feels like someone is "slicing the meat open". She also notes mild posterior neck pain today; different from pain felt prior to surgery. She deneis vomiting, SOB, fever, diarrhea, cough, bowel/bladder incontinence, any new numbness or tingling, any new weakness.  She is left hand dominant. She is currently taking tramadol for pain.  She was previously on Percocet but is no longer taking this medication. Pt has follow up scheduled for 05/27/2015 with neurosurgery.  ROS: See HPI Constitutional: no fever  Eyes: no drainage  ENT: no runny nose   Resp: no SOB  GI: no vomiting GU: no dysuria Integumentary: no rash  Allergy: no hives  Musculoskeletal: no leg swelling  Neurological: no slurred speech ROS otherwise negative  PAST MEDICAL HISTORY/PAST SURGICAL HISTORY:  Past Medical History  Diagnosis Date  . Thyroid disease   . Hypothyroidism   . Complication of anesthesia 1968    "fought the anesthesia"  . Asthma     " allergies"  . Hypertension   . GERD (gastroesophageal reflux disease)   . Depression   . Arthritis     MEDICATIONS:  Prior to Admission medications   Medication Sig Start Date End Date Taking? Authorizing Provider  aspirin EC 81 MG tablet Take 81 mg by mouth 2 (two)  times daily.    Historical Provider, MD  B Complex-C-Folic Acid (B COMPLEX + C TR PO) Take 1 tablet by mouth daily.     Historical Provider, MD  calcium carbonate (OS-CAL) 600 MG TABS Take 600 mg by mouth every evening.     Historical Provider, MD  Cholecalciferol (D3 SUPER STRENGTH) 2000 UNITS CAPS Take 1 capsule by mouth daily.    Historical Provider, MD  cyclobenzaprine (FLEXERIL) 10 MG tablet Take 10 mg by mouth every evening.     Historical Provider, MD  estazolam (PROSOM) 2 MG tablet Take 2 mg by mouth at bedtime.    Historical Provider, MD  Feverfew 380 MG CAPS Take 1 capsule by mouth daily.     Historical Provider, MD  fluticasone (FLONASE) 50 MCG/ACT nasal spray Place 1 spray into both nostrils daily as needed for allergies or rhinitis.    Historical Provider, MD  Glucosamine 500 MG TABS Take by mouth 2 (two) times daily.    Historical Provider, MD  levalbuterol (XOPENEX HFA) 45 MCG/ACT inhaler Inhale 1 puff into the lungs every 4 (four) hours as needed for wheezing.    Historical Provider, MD  levothyroxine (SYNTHROID, LEVOTHROID) 50 MCG tablet Take 50 mcg by mouth every morning.     Historical Provider, MD  losartan (COZAAR) 100 MG tablet Take 100 mg by mouth daily.    Historical Provider, MD  magnesium oxide (MAG-OX) 400 MG tablet Take 400 mg by mouth daily.    Historical Provider, MD  metoprolol succinate (TOPROL-XL) 50 MG 24 hr tablet Take 50 mg by mouth every morning. Take with or immediately following a meal.    Historical Provider, MD  mometasone (NASONEX) 50 MCG/ACT nasal spray Place 2 sprays into the nose daily as needed (allergies).    Historical Provider, MD  omeprazole (PRILOSEC) 20 MG capsule Take 20 mg by mouth every morning.     Historical Provider, MD  oxyCODONE-acetaminophen (PERCOCET) 7.5-325 MG per tablet Take 1 tablet by mouth every 4 (four) hours as needed for pain. 09/08/12   Leonard Schwartz, MD  traMADol (ULTRAM) 50 MG tablet Take 1 tablet (50 mg total) by mouth every 6  (six) hours as needed for pain. 09/08/12   Leonard Schwartz, MD    ALLERGIES:  Allergies  Allergen Reactions  . Beta Adrenergic Blockers Other (See Comments)    "Have voice problem"  . Mobic [Meloxicam] Hypertension    Made blood pressure extremely high  . Sulfa Antibiotics Swelling    And itching  . Tape Rash  . Aspirin Other (See Comments)    Stomach burning; increased acid  . Oxycodone Other (See Comments)    Hallucinations, knocks her out    SOCIAL HISTORY:  Social History  Substance Use Topics  . Smoking status: Never Smoker   . Smokeless tobacco: Never Used  . Alcohol Use: No    FAMILY HISTORY: Family History  Problem Relation Age of Onset  . Colon cancer Neg Hx     EXAM: BP 148/85 mmHg  Pulse 78  Temp(Src) 97.9 F (36.6 C) (Oral)  Resp 20  Ht 5\' 2"  (1.575 m)  Wt 168 lb (76.204 kg)  BMI 30.72 kg/m2  SpO2 100% CONSTITUTIONAL: Alert and oriented and responds appropriately to questions. Well-appearing; well-nourished, elderly, appears uncomfortable but is not in any distress, afebrile and nontoxic HEAD: Normocephalic EYES: Conjunctivae clear, PERRL ENT: normal nose; no rhinorrhea; moist mucous membranes; pharynx without lesions noted NECK: Supple, no meningismus, no LAD; surgical incision to left neck healing without surrounding erythema, warmth, or drainage. No neck hematoma. Trachea is midline. No midline spinal tenderness stepoffs or deformity.  Patient has a small amount of tenderness over the left paraspinal musculature posteriorly with no associated lesions. No ecchymosis. She has a soft collar in place. CARD: RRR; S1 and S2 appreciated; no murmurs, no clicks, no rubs, no gallops CHEST: TTP upper right anterior chest around the clavicle and SCM without associated erythema, warmth, swelling ecchymosis or crepitus. She is tender to just minimal time shortness side. RESP: Normal chest excursion without splinting or tachypnea; breath sounds clear and equal  bilaterally; no wheezes, no rhonchi, no rales, no hypoxia or respiratory distress, speaking full sentences ABD/GI: Normal bowel sounds; non-distended; soft, non-tender, no rebound, no guarding, no peritoneal signs BACK:  The back appears normal and is non-tender to palpation, there is no CVA tenderness EXT: Unable to fully range patient's right shoulder without any pain. There is no joint effusion, erythema or warmth. She has 2+ radial pulse on the right side. No tenderness throughout the right arm on palpation. Normal ROM in all joints; otherwise extremity is are non-tender to palpation; no edema; normal capillary refill; no cyanosis, no calf tenderness or swelling    SKIN: Normal color for age and race; warm NEURO: Moves all extremities equally, reports diminished sensation in left foot and distal LLE that is chronic but otherwise sensation to light touch intact diffusely, cranial nerves II through XII intact. No sadle anesthesia. 2+ DTRs in BLE  and BUE. Decreased strength in R biceps compared to left the patient also reports is chronic. Otherwise she has 5/5 strength in other muscle groups. Normal grip strength bilaterally. Normal gait. PSYCH: The patient's mood and manner are appropriate. Grooming and personal hygiene are appropriate.    EKG Interpretation  Date/Time:  Saturday May 16 2015 03:34:23 EST Ventricular Rate:  69 PR Interval:  190 QRS Duration: 99 QT Interval:  380 QTC Calculation: 407 R Axis:   51 Text Interpretation:  Sinus rhythm Low voltage, precordial leads No significant change since last tracing Confirmed by Lorenza Winkleman,  DO, [place] Noyce [id number]) on 05/16/2015 3:37:03 AM       MEDICAL DECISION MAKING: Patient here with complaints of right sided chest pain over the shoulder and neck pain. She appears tender over the distal portion of the sternocleidomastoid and over the upper anterior chest wall. She is tender to just minimal palpation. There is no hematoma in this area,  ecchymosis, signs of infection. She has great range of motion in the shoulder and no signs of septic arthritis. No midline spinal tenderness on exam and the anterior incision is on the left side of her neck is clean, dry and intact without any drainage or signs of infection. She has no neck hematoma in her trachea is midline. She states she is taking tramadol for pain but this is not helping. She has no new neurologic deficits on exam. She has chronic left lower extremity numbness and chronic weakness in the right upper extremity. EKG shows no ischemic changes. This is very atypical for ACS. I do not think this is a pulmonary embolus or dissection. Labs pending. We'll also obtain a CT of her neck to evaluate her hardware and a chest x-ray. We'll give fentanyl for pain and reassess.  ED PROGRESS: Patient reports her pain is better after fentanyl. Her labs are unremarkable including negative troponin. No leukocytosis. No fever. Chest x-ray clear with no abnormality. CT of her neck shows no acute abnormality and hardware appears in place. I feel she is safe to be discharged. We'll discharge her on Percocet which she states is helping her pain previously. Have advised her not to take this with tramadol together. She has an appointment to see her neurosurgeon on December 21. I do not think her pain is related to any surgical complication. Again there is no sign of infection and it appears her neurologic exam is actually improving since before the surgery. I will send a note to Dr. Joya Salm updating him on patient's visit. I do not think that her appointment needs to be moved up any sooner unless her symptoms worsen. Have discussed with her strict return precautions. Patient and husband at bedside during this conversation. They verbalize understanding and are comfortable with this plan.   I personally performed the services described in this documentation, which was scribed in my presence. The recorded information has  been reviewed and is accurate.    [place], DO 05/16/15 [phone]

## 2015-06-09 DIAGNOSIS — M4802 Spinal stenosis, cervical region: Secondary | ICD-10-CM | POA: Diagnosis not present

## 2015-06-23 ENCOUNTER — Encounter: Payer: Self-pay | Admitting: Neurology

## 2015-06-25 DIAGNOSIS — G5600 Carpal tunnel syndrome, unspecified upper limb: Secondary | ICD-10-CM | POA: Diagnosis not present

## 2015-06-25 DIAGNOSIS — G629 Polyneuropathy, unspecified: Secondary | ICD-10-CM | POA: Diagnosis not present

## 2015-08-03 DIAGNOSIS — M65312 Trigger thumb, left thumb: Secondary | ICD-10-CM | POA: Diagnosis not present

## 2015-08-28 DIAGNOSIS — M4802 Spinal stenosis, cervical region: Secondary | ICD-10-CM | POA: Diagnosis not present

## 2015-09-02 DIAGNOSIS — M65312 Trigger thumb, left thumb: Secondary | ICD-10-CM | POA: Diagnosis not present

## 2015-09-02 DIAGNOSIS — M25562 Pain in left knee: Secondary | ICD-10-CM | POA: Diagnosis not present

## 2015-10-06 DIAGNOSIS — I10 Essential (primary) hypertension: Secondary | ICD-10-CM | POA: Diagnosis not present

## 2015-10-06 DIAGNOSIS — E039 Hypothyroidism, unspecified: Secondary | ICD-10-CM | POA: Diagnosis not present

## 2015-10-12 DIAGNOSIS — I10 Essential (primary) hypertension: Secondary | ICD-10-CM | POA: Diagnosis not present

## 2015-10-12 DIAGNOSIS — E039 Hypothyroidism, unspecified: Secondary | ICD-10-CM | POA: Diagnosis not present

## 2015-10-21 DIAGNOSIS — Z803 Family history of malignant neoplasm of breast: Secondary | ICD-10-CM | POA: Diagnosis not present

## 2015-10-21 DIAGNOSIS — Z1231 Encounter for screening mammogram for malignant neoplasm of breast: Secondary | ICD-10-CM | POA: Diagnosis not present

## 2015-11-25 ENCOUNTER — Other Ambulatory Visit: Payer: Self-pay | Admitting: Otolaryngology

## 2015-11-25 DIAGNOSIS — J320 Chronic maxillary sinusitis: Secondary | ICD-10-CM | POA: Diagnosis not present

## 2015-11-25 DIAGNOSIS — J322 Chronic ethmoidal sinusitis: Secondary | ICD-10-CM | POA: Diagnosis not present

## 2015-11-25 DIAGNOSIS — K0889 Other specified disorders of teeth and supporting structures: Secondary | ICD-10-CM

## 2015-11-25 DIAGNOSIS — K046 Periapical abscess with sinus: Secondary | ICD-10-CM | POA: Diagnosis not present

## 2015-11-30 ENCOUNTER — Ambulatory Visit
Admission: RE | Admit: 2015-11-30 | Discharge: 2015-11-30 | Disposition: A | Discharge status: Home / Self Care | Payer: PPO | Source: Ambulatory Visit | Attending: Otolaryngology | Admitting: Otolaryngology

## 2015-11-30 DIAGNOSIS — R51 Headache: Secondary | ICD-10-CM | POA: Diagnosis not present

## 2015-11-30 DIAGNOSIS — K0889 Other specified disorders of teeth and supporting structures: Secondary | ICD-10-CM

## 2015-12-02 DIAGNOSIS — J322 Chronic ethmoidal sinusitis: Secondary | ICD-10-CM | POA: Diagnosis not present

## 2015-12-02 DIAGNOSIS — H7292 Unspecified perforation of tympanic membrane, left ear: Secondary | ICD-10-CM | POA: Diagnosis not present

## 2015-12-02 DIAGNOSIS — J320 Chronic maxillary sinusitis: Secondary | ICD-10-CM | POA: Diagnosis not present

## 2015-12-28 DIAGNOSIS — M4802 Spinal stenosis, cervical region: Secondary | ICD-10-CM | POA: Diagnosis not present

## 2016-01-18 DIAGNOSIS — M65312 Trigger thumb, left thumb: Secondary | ICD-10-CM | POA: Diagnosis not present

## 2016-03-16 DIAGNOSIS — M65312 Trigger thumb, left thumb: Secondary | ICD-10-CM | POA: Diagnosis not present

## 2016-03-16 DIAGNOSIS — M1712 Unilateral primary osteoarthritis, left knee: Secondary | ICD-10-CM | POA: Diagnosis not present

## 2016-03-21 DIAGNOSIS — H6061 Unspecified chronic otitis externa, right ear: Secondary | ICD-10-CM | POA: Diagnosis not present

## 2016-03-21 DIAGNOSIS — J3501 Chronic tonsillitis: Secondary | ICD-10-CM | POA: Diagnosis not present

## 2016-04-13 DIAGNOSIS — J320 Chronic maxillary sinusitis: Secondary | ICD-10-CM | POA: Diagnosis not present

## 2016-04-13 DIAGNOSIS — J370 Chronic laryngitis: Secondary | ICD-10-CM | POA: Diagnosis not present

## 2016-04-13 DIAGNOSIS — J040 Acute laryngitis: Secondary | ICD-10-CM | POA: Diagnosis not present

## 2016-04-13 DIAGNOSIS — J039 Acute tonsillitis, unspecified: Secondary | ICD-10-CM | POA: Diagnosis not present

## 2016-05-04 DIAGNOSIS — M1990 Unspecified osteoarthritis, unspecified site: Secondary | ICD-10-CM | POA: Diagnosis not present

## 2016-05-04 DIAGNOSIS — K219 Gastro-esophageal reflux disease without esophagitis: Secondary | ICD-10-CM | POA: Diagnosis not present

## 2016-05-04 DIAGNOSIS — J309 Allergic rhinitis, unspecified: Secondary | ICD-10-CM | POA: Diagnosis not present

## 2016-05-04 DIAGNOSIS — J45909 Unspecified asthma, uncomplicated: Secondary | ICD-10-CM | POA: Diagnosis not present

## 2016-05-04 DIAGNOSIS — I10 Essential (primary) hypertension: Secondary | ICD-10-CM | POA: Diagnosis not present

## 2016-05-04 DIAGNOSIS — M25562 Pain in left knee: Secondary | ICD-10-CM | POA: Diagnosis not present

## 2016-05-04 DIAGNOSIS — M1712 Unilateral primary osteoarthritis, left knee: Secondary | ICD-10-CM | POA: Diagnosis not present

## 2016-05-04 DIAGNOSIS — M797 Fibromyalgia: Secondary | ICD-10-CM | POA: Diagnosis not present

## 2016-05-09 DIAGNOSIS — M25562 Pain in left knee: Secondary | ICD-10-CM | POA: Diagnosis not present

## 2016-06-02 DIAGNOSIS — Z6831 Body mass index (BMI) 31.0-31.9, adult: Secondary | ICD-10-CM | POA: Diagnosis not present

## 2016-06-02 DIAGNOSIS — M5126 Other intervertebral disc displacement, lumbar region: Secondary | ICD-10-CM | POA: Diagnosis not present

## 2016-06-02 DIAGNOSIS — M549 Dorsalgia, unspecified: Secondary | ICD-10-CM | POA: Diagnosis not present

## 2016-06-02 DIAGNOSIS — I10 Essential (primary) hypertension: Secondary | ICD-10-CM | POA: Diagnosis not present

## 2016-06-08 DIAGNOSIS — M25562 Pain in left knee: Secondary | ICD-10-CM | POA: Diagnosis not present

## 2016-06-21 DIAGNOSIS — G4700 Insomnia, unspecified: Secondary | ICD-10-CM | POA: Diagnosis not present

## 2016-06-21 DIAGNOSIS — M1990 Unspecified osteoarthritis, unspecified site: Secondary | ICD-10-CM | POA: Diagnosis not present

## 2016-06-21 DIAGNOSIS — M797 Fibromyalgia: Secondary | ICD-10-CM | POA: Diagnosis not present

## 2016-06-21 DIAGNOSIS — M549 Dorsalgia, unspecified: Secondary | ICD-10-CM | POA: Diagnosis not present

## 2016-06-28 ENCOUNTER — Other Ambulatory Visit: Payer: Self-pay | Admitting: Neurosurgery

## 2016-06-28 DIAGNOSIS — M5126 Other intervertebral disc displacement, lumbar region: Secondary | ICD-10-CM

## 2016-07-01 DIAGNOSIS — R05 Cough: Secondary | ICD-10-CM | POA: Diagnosis not present

## 2016-07-01 DIAGNOSIS — J039 Acute tonsillitis, unspecified: Secondary | ICD-10-CM | POA: Diagnosis not present

## 2016-07-01 DIAGNOSIS — J370 Chronic laryngitis: Secondary | ICD-10-CM | POA: Diagnosis not present

## 2016-07-01 DIAGNOSIS — J410 Simple chronic bronchitis: Secondary | ICD-10-CM | POA: Diagnosis not present

## 2016-07-01 DIAGNOSIS — J320 Chronic maxillary sinusitis: Secondary | ICD-10-CM | POA: Diagnosis not present

## 2016-07-01 DIAGNOSIS — J322 Chronic ethmoidal sinusitis: Secondary | ICD-10-CM | POA: Diagnosis not present

## 2016-07-01 DIAGNOSIS — R490 Dysphonia: Secondary | ICD-10-CM | POA: Diagnosis not present

## 2016-07-04 ENCOUNTER — Ambulatory Visit
Admission: RE | Admit: 2016-07-04 | Discharge: 2016-07-04 | Disposition: A | Discharge status: Home / Self Care | Payer: PPO | Source: Ambulatory Visit | Attending: Neurosurgery | Admitting: Neurosurgery

## 2016-07-04 DIAGNOSIS — M5126 Other intervertebral disc displacement, lumbar region: Secondary | ICD-10-CM

## 2016-07-04 DIAGNOSIS — M48061 Spinal stenosis, lumbar region without neurogenic claudication: Secondary | ICD-10-CM | POA: Diagnosis not present

## 2016-07-04 MED ORDER — IOPAMIDOL (ISOVUE-M 200) INJECTION 41%
15.0000 mL | Freq: Once | INTRAMUSCULAR | Status: AC
  Administered 2016-07-04: 15 mL via INTRATHECAL

## 2016-07-04 MED ORDER — ONDANSETRON HCL 4 MG/2ML IJ SOLN: 4.0000 mg | Freq: Four times a day (QID) | INTRAMUSCULAR | Status: DC | PRN

## 2016-07-04 MED ORDER — ONDANSETRON HCL 4 MG/2ML IJ SOLN
4.0000 mg | Freq: Once | INTRAMUSCULAR | Status: AC
  Administered 2016-07-04: 4 mg via INTRAMUSCULAR

## 2016-07-04 MED ORDER — MEPERIDINE HCL 100 MG/ML IJ SOLN
75.0000 mg | Freq: Once | INTRAMUSCULAR | Status: AC
  Administered 2016-07-04: 75 mg via INTRAMUSCULAR

## 2016-07-04 MED ORDER — DIAZEPAM 5 MG PO TABS
5.0000 mg | ORAL_TABLET | Freq: Once | ORAL | Status: AC
  Administered 2016-07-04: 5 mg via ORAL

## 2016-07-04 NOTE — Discharge Instructions (Signed)

## 2016-07-06 ENCOUNTER — Other Ambulatory Visit: Payer: Self-pay | Admitting: Neurosurgery

## 2016-07-06 DIAGNOSIS — M546 Pain in thoracic spine: Secondary | ICD-10-CM | POA: Diagnosis not present

## 2016-07-06 DIAGNOSIS — M48062 Spinal stenosis, lumbar region with neurogenic claudication: Secondary | ICD-10-CM | POA: Diagnosis not present

## 2016-07-06 DIAGNOSIS — M4726 Other spondylosis with radiculopathy, lumbar region: Secondary | ICD-10-CM | POA: Diagnosis not present

## 2016-07-06 DIAGNOSIS — M5136 Other intervertebral disc degeneration, lumbar region: Secondary | ICD-10-CM | POA: Diagnosis not present

## 2016-07-06 DIAGNOSIS — M5126 Other intervertebral disc displacement, lumbar region: Secondary | ICD-10-CM | POA: Diagnosis not present

## 2016-07-08 DIAGNOSIS — R490 Dysphonia: Secondary | ICD-10-CM | POA: Diagnosis not present

## 2016-07-08 DIAGNOSIS — J040 Acute laryngitis: Secondary | ICD-10-CM | POA: Diagnosis not present

## 2016-07-14 ENCOUNTER — Encounter (HOSPITAL_COMMUNITY): Payer: Self-pay | Admitting: *Deleted

## 2016-07-14 NOTE — Progress Notes (Signed)
Pt denies SOB, chest pain, and being under the care of a cardiologist. Pt denies having a cardiac cath but stated that an echo was done " a couple of years ago;" records requested in addition to sleep study report from PCP,  Dr. Tommy Medal. Pt made aware to stop taking  Aspirin, vitamins, fish oil and herbal medications. Do not take any NSAIDs ie: Ibuprofen, Advil, Naproxen, BC and Goody Powder or any medication containing Aspirin. Pt verbalized understanding of all pre-op instructions.

## 2016-07-18 ENCOUNTER — Inpatient Hospital Stay (HOSPITAL_COMMUNITY): Payer: PPO | Admitting: Certified Registered Nurse Anesthetist

## 2016-07-18 ENCOUNTER — Inpatient Hospital Stay (HOSPITAL_COMMUNITY): Payer: PPO

## 2016-07-18 ENCOUNTER — Inpatient Hospital Stay (HOSPITAL_COMMUNITY)
Admission: RE | Admit: 2016-07-18 | Discharge: 2016-07-20 | DRG: 455 | Disposition: A | Discharge status: Home / Self Care | Payer: PPO | Source: Ambulatory Visit | Attending: Neurosurgery | Admitting: Neurosurgery

## 2016-07-18 ENCOUNTER — Encounter (HOSPITAL_COMMUNITY): Payer: Self-pay | Admitting: Certified Registered Nurse Anesthetist

## 2016-07-18 ENCOUNTER — Encounter (HOSPITAL_COMMUNITY)
Admission: RE | Disposition: A | Discharge status: Home / Self Care | Payer: Self-pay | Source: Ambulatory Visit | Attending: Neurosurgery

## 2016-07-18 DIAGNOSIS — J45909 Unspecified asthma, uncomplicated: Secondary | ICD-10-CM | POA: Diagnosis not present

## 2016-07-18 DIAGNOSIS — M4326 Fusion of spine, lumbar region: Secondary | ICD-10-CM | POA: Diagnosis not present

## 2016-07-18 DIAGNOSIS — I10 Essential (primary) hypertension: Secondary | ICD-10-CM | POA: Diagnosis not present

## 2016-07-18 DIAGNOSIS — M4726 Other spondylosis with radiculopathy, lumbar region: Secondary | ICD-10-CM | POA: Diagnosis not present

## 2016-07-18 DIAGNOSIS — M5116 Intervertebral disc disorders with radiculopathy, lumbar region: Secondary | ICD-10-CM | POA: Diagnosis not present

## 2016-07-18 DIAGNOSIS — M48062 Spinal stenosis, lumbar region with neurogenic claudication: Secondary | ICD-10-CM | POA: Diagnosis present

## 2016-07-18 DIAGNOSIS — Z981 Arthrodesis status: Secondary | ICD-10-CM | POA: Diagnosis not present

## 2016-07-18 DIAGNOSIS — Z7982 Long term (current) use of aspirin: Secondary | ICD-10-CM | POA: Diagnosis not present

## 2016-07-18 DIAGNOSIS — E039 Hypothyroidism, unspecified: Secondary | ICD-10-CM | POA: Diagnosis not present

## 2016-07-18 DIAGNOSIS — F329 Major depressive disorder, single episode, unspecified: Secondary | ICD-10-CM | POA: Diagnosis not present

## 2016-07-18 DIAGNOSIS — Z419 Encounter for procedure for purposes other than remedying health state, unspecified: Secondary | ICD-10-CM

## 2016-07-18 DIAGNOSIS — M545 Low back pain: Secondary | ICD-10-CM | POA: Diagnosis not present

## 2016-07-18 DIAGNOSIS — M4802 Spinal stenosis, cervical region: Secondary | ICD-10-CM | POA: Diagnosis not present

## 2016-07-18 DIAGNOSIS — M47812 Spondylosis without myelopathy or radiculopathy, cervical region: Secondary | ICD-10-CM | POA: Diagnosis not present

## 2016-07-18 DIAGNOSIS — Z79899 Other long term (current) drug therapy: Secondary | ICD-10-CM | POA: Diagnosis not present

## 2016-07-18 DIAGNOSIS — K219 Gastro-esophageal reflux disease without esophagitis: Secondary | ICD-10-CM | POA: Diagnosis present

## 2016-07-18 DIAGNOSIS — M797 Fibromyalgia: Secondary | ICD-10-CM | POA: Diagnosis not present

## 2016-07-18 DIAGNOSIS — M5136 Other intervertebral disc degeneration, lumbar region: Secondary | ICD-10-CM | POA: Diagnosis not present

## 2016-07-18 HISTORY — DX: Unspecified tear of unspecified meniscus, current injury, unspecified knee, initial encounter: S83.209A

## 2016-07-18 HISTORY — DX: Nausea with vomiting, unspecified: R11.2

## 2016-07-18 HISTORY — DX: Other specified postprocedural states: Z98.890

## 2016-07-18 HISTORY — DX: Dorsalgia, unspecified: M54.9

## 2016-07-18 HISTORY — DX: Presence of spectacles and contact lenses: Z97.3

## 2016-07-18 HISTORY — DX: Personal history of other diseases of the digestive system: Z87.19

## 2016-07-18 HISTORY — DX: Other chronic pain: G89.29

## 2016-07-18 HISTORY — DX: Fibromyalgia: M79.7

## 2016-07-18 LAB — BASIC METABOLIC PANEL
Anion gap: 8 (ref 5–15)
BUN: 13 mg/dL (ref 6–20)
CO2: 25 mmol/L (ref 22–32)
Calcium: 9.6 mg/dL (ref 8.9–10.3)
Chloride: 109 mmol/L (ref 101–111)
Creatinine, Ser: 0.91 mg/dL (ref 0.44–1.00)
GFR calc Af Amer: >60 mL/min (ref >60)
GFR calc non Af Amer: >60 mL/min (ref >60)
Glucose, Bld: 91 mg/dL (ref 65–99)
Potassium: 3.8 mmol/L (ref 3.5–5.1)
Sodium: 142 mmol/L (ref 135–145)

## 2016-07-18 LAB — ABO/RH: ABO/RH(D): O POS

## 2016-07-18 LAB — CBC
HCT: 37.2 % (ref 36.0–46.0)
Hemoglobin: 12.2 g/dL (ref 12.0–15.0)
MCH: 27 pg (ref 26.0–34.0)
MCHC: 32.8 g/dL (ref 30.0–36.0)
MCV: 82.3 fL (ref 78.0–100.0)
Platelets: 304 10*3/uL (ref 150–400)
RBC: 4.52 MIL/uL (ref 3.87–5.11)
RDW: 14.4 % (ref 11.5–15.5)
WBC: 5.1 10*3/uL (ref 4.0–10.5)

## 2016-07-18 LAB — TYPE AND SCREEN
ABO/RH(D): O POS
Antibody Screen: NEGATIVE

## 2016-07-18 LAB — SURGICAL PCR SCREEN
MRSA, PCR: NEGATIVE
Staphylococcus aureus: NEGATIVE

## 2016-07-18 SURGERY — POSTERIOR LUMBAR FUSION 2 WITH HARDWARE REMOVAL
Anesthesia: General | Site: Back

## 2016-07-18 MED ORDER — ACETAMINOPHEN 10 MG/ML IV SOLN
INTRAVENOUS | Status: DC | PRN
  Administered 2016-07-18: 1000 mg via INTRAVENOUS

## 2016-07-18 MED ORDER — MUPIROCIN 2 % EX OINT
TOPICAL_OINTMENT | CUTANEOUS | Status: AC
  Administered 2016-07-18: 1 via TOPICAL
  Filled 2016-07-18: qty 22

## 2016-07-18 MED ORDER — MAGNESIUM HYDROXIDE 400 MG/5ML PO SUSP: 30.0000 mL | Freq: Every day | ORAL | Status: DC | PRN

## 2016-07-18 MED ORDER — HYDROMORPHONE HCL 1 MG/ML IJ SOLN
0.2500 mg | INTRAMUSCULAR | Status: DC | PRN
  Administered 2016-07-18: 0.5 mg via INTRAVENOUS

## 2016-07-18 MED ORDER — PHENYLEPHRINE 40 MCG/ML (10ML) SYRINGE FOR IV PUSH (FOR BLOOD PRESSURE SUPPORT)
PREFILLED_SYRINGE | INTRAVENOUS | Status: AC
  Filled 2016-07-18: qty 10

## 2016-07-18 MED ORDER — PHENYLEPHRINE HCL 10 MG/ML IJ SOLN
INTRAVENOUS | Status: DC | PRN
  Administered 2016-07-18: 20 ug/min via INTRAVENOUS

## 2016-07-18 MED ORDER — ACETAMINOPHEN 10 MG/ML IV SOLN
INTRAVENOUS | Status: AC
  Filled 2016-07-18: qty 100

## 2016-07-18 MED ORDER — SODIUM CHLORIDE 0.9% FLUSH
3.0000 mL | Freq: Two times a day (BID) | INTRAVENOUS | Status: DC
  Administered 2016-07-19 – 2016-07-20 (×3): 3 mL via INTRAVENOUS

## 2016-07-18 MED ORDER — LIDOCAINE-EPINEPHRINE 1 %-1:100000 IJ SOLN
INTRAMUSCULAR | Status: DC | PRN
  Administered 2016-07-18: 20 mL

## 2016-07-18 MED ORDER — MIDAZOLAM HCL 5 MG/5ML IJ SOLN
INTRAMUSCULAR | Status: DC | PRN
  Administered 2016-07-18: 1 mg via INTRAVENOUS

## 2016-07-18 MED ORDER — BISACODYL 10 MG RE SUPP: 10.0000 mg | Freq: Every day | RECTAL | Status: DC | PRN

## 2016-07-18 MED ORDER — PROPOFOL 10 MG/ML IV BOLUS
INTRAVENOUS | Status: DC | PRN
  Administered 2016-07-18: 120 mg via INTRAVENOUS
  Administered 2016-07-18: 50 mg via INTRAVENOUS

## 2016-07-18 MED ORDER — METOPROLOL SUCCINATE ER 25 MG PO TB24
25.0000 mg | ORAL_TABLET | Freq: Every day | ORAL | Status: DC
  Administered 2016-07-18: 25 mg via ORAL
  Filled 2016-07-18: qty 1

## 2016-07-18 MED ORDER — ARTIFICIAL TEARS OP OINT
TOPICAL_OINTMENT | OPHTHALMIC | Status: DC | PRN
  Administered 2016-07-18: 1 via OPHTHALMIC

## 2016-07-18 MED ORDER — BUPIVACAINE HCL (PF) 0.5 % IJ SOLN
INTRAMUSCULAR | Status: DC | PRN
  Administered 2016-07-18: 20 mL

## 2016-07-18 MED ORDER — ONDANSETRON HCL 4 MG/2ML IJ SOLN
INTRAMUSCULAR | Status: DC | PRN
  Administered 2016-07-18: 4 mg via INTRAVENOUS

## 2016-07-18 MED ORDER — PHENYLEPHRINE HCL 10 MG/ML IJ SOLN
INTRAMUSCULAR | Status: DC | PRN
  Administered 2016-07-18: 80 ug via INTRAVENOUS

## 2016-07-18 MED ORDER — CEFAZOLIN SODIUM-DEXTROSE 2-4 GM/100ML-% IV SOLN
2.0000 g | INTRAVENOUS | Status: AC
  Administered 2016-07-18: 2 g via INTRAVENOUS

## 2016-07-18 MED ORDER — NON FORMULARY: 20.0000 mg | Freq: Every day | Status: DC

## 2016-07-18 MED ORDER — OXYCODONE-ACETAMINOPHEN 5-325 MG PO TABS: 1.0000 | ORAL_TABLET | ORAL | Status: DC | PRN

## 2016-07-18 MED ORDER — MENTHOL 3 MG MT LOZG: 1.0000 | LOZENGE | OROMUCOSAL | Status: DC | PRN

## 2016-07-18 MED ORDER — OMEPRAZOLE 20 MG PO CPDR
20.0000 mg | DELAYED_RELEASE_CAPSULE | Freq: Every day | ORAL | Status: DC
  Administered 2016-07-19 – 2016-07-20 (×2): 20 mg via ORAL
  Filled 2016-07-18 (×4): qty 1

## 2016-07-18 MED ORDER — ONDANSETRON HCL 4 MG/2ML IJ SOLN: 4.0000 mg | Freq: Four times a day (QID) | INTRAMUSCULAR | Status: DC | PRN

## 2016-07-18 MED ORDER — OXYCODONE-ACETAMINOPHEN 5-325 MG PO TABS
1.0000 | ORAL_TABLET | ORAL | Status: DC | PRN
  Administered 2016-07-18 – 2016-07-19 (×4): 2 via ORAL
  Administered 2016-07-19: 1 via ORAL
  Administered 2016-07-19: 2 via ORAL
  Administered 2016-07-19: 1 via ORAL
  Administered 2016-07-20 (×4): 2 via ORAL
  Filled 2016-07-18 (×10): qty 2

## 2016-07-18 MED ORDER — BUPIVACAINE HCL (PF) 0.5 % IJ SOLN
INTRAMUSCULAR | Status: AC
  Filled 2016-07-18: qty 30

## 2016-07-18 MED ORDER — 0.9 % SODIUM CHLORIDE (POUR BTL) OPTIME
TOPICAL | Status: DC | PRN
  Administered 2016-07-18: 1000 mL

## 2016-07-18 MED ORDER — CYCLOBENZAPRINE HCL 10 MG PO TABS
ORAL_TABLET | ORAL | Status: AC
  Filled 2016-07-18: qty 1

## 2016-07-18 MED ORDER — KETOROLAC TROMETHAMINE 30 MG/ML IJ SOLN
30.0000 mg | Freq: Once | INTRAMUSCULAR | Status: AC
  Administered 2016-07-18: 30 mg via INTRAVENOUS

## 2016-07-18 MED ORDER — THROMBIN 20000 UNITS EX SOLR
CUTANEOUS | Status: AC
  Filled 2016-07-18: qty 20000

## 2016-07-18 MED ORDER — ONDANSETRON HCL 4 MG/2ML IJ SOLN
INTRAMUSCULAR | Status: AC
  Filled 2016-07-18: qty 2

## 2016-07-18 MED ORDER — HYDROXYZINE HCL 50 MG/ML IM SOLN: 50.0000 mg | INTRAMUSCULAR | Status: DC | PRN

## 2016-07-18 MED ORDER — LACTATED RINGERS IV SOLN
INTRAVENOUS | Status: DC
  Administered 2016-07-18 (×2): via INTRAVENOUS

## 2016-07-18 MED ORDER — KETOROLAC TROMETHAMINE 15 MG/ML IJ SOLN
15.0000 mg | Freq: Four times a day (QID) | INTRAMUSCULAR | Status: AC
  Administered 2016-07-18 – 2016-07-20 (×8): 15 mg via INTRAVENOUS
  Filled 2016-07-18 (×8): qty 1

## 2016-07-18 MED ORDER — HYDROXYZINE HCL 25 MG PO TABS: 50.0000 mg | ORAL_TABLET | ORAL | Status: DC | PRN

## 2016-07-18 MED ORDER — LIDOCAINE 2% (20 MG/ML) 5 ML SYRINGE
INTRAMUSCULAR | Status: AC
  Filled 2016-07-18: qty 5

## 2016-07-18 MED ORDER — LIDOCAINE HCL (CARDIAC) 20 MG/ML IV SOLN
INTRAVENOUS | Status: DC | PRN
  Administered 2016-07-18: 60 mg via INTRAVENOUS

## 2016-07-18 MED ORDER — ONDANSETRON HCL 4 MG PO TABS: 4.0000 mg | ORAL_TABLET | Freq: Four times a day (QID) | ORAL | Status: DC | PRN

## 2016-07-18 MED ORDER — FENTANYL CITRATE (PF) 100 MCG/2ML IJ SOLN
INTRAMUSCULAR | Status: AC
  Filled 2016-07-18: qty 2

## 2016-07-18 MED ORDER — PROPOFOL 10 MG/ML IV BOLUS
INTRAVENOUS | Status: AC
  Filled 2016-07-18: qty 20

## 2016-07-18 MED ORDER — LEVOTHYROXINE SODIUM 50 MCG PO TABS
50.0000 ug | ORAL_TABLET | Freq: Every day | ORAL | Status: DC
  Administered 2016-07-19 – 2016-07-20 (×2): 50 ug via ORAL
  Filled 2016-07-18 (×3): qty 1

## 2016-07-18 MED ORDER — LOSARTAN POTASSIUM 50 MG PO TABS
100.0000 mg | ORAL_TABLET | Freq: Every day | ORAL | Status: DC
  Administered 2016-07-19 – 2016-07-20 (×2): 100 mg via ORAL
  Filled 2016-07-18 (×2): qty 2

## 2016-07-18 MED ORDER — CEFAZOLIN SODIUM-DEXTROSE 2-4 GM/100ML-% IV SOLN
INTRAVENOUS | Status: AC
  Filled 2016-07-18: qty 100

## 2016-07-18 MED ORDER — MUPIROCIN 2 % EX OINT
1.0000 "application " | TOPICAL_OINTMENT | Freq: Once | CUTANEOUS | Status: AC
  Administered 2016-07-18: 1 via TOPICAL

## 2016-07-18 MED ORDER — SODIUM CHLORIDE 0.9% FLUSH: 3.0000 mL | INTRAVENOUS | Status: DC | PRN

## 2016-07-18 MED ORDER — THROMBIN 20000 UNITS EX SOLR
CUTANEOUS | Status: DC | PRN
  Administered 2016-07-18: 20 mL via TOPICAL

## 2016-07-18 MED ORDER — SUGAMMADEX SODIUM 200 MG/2ML IV SOLN
INTRAVENOUS | Status: AC
  Filled 2016-07-18: qty 2

## 2016-07-18 MED ORDER — ROCURONIUM BROMIDE 100 MG/10ML IV SOLN
INTRAVENOUS | Status: DC | PRN
  Administered 2016-07-18 (×2): 10 mg via INTRAVENOUS
  Administered 2016-07-18: 50 mg via INTRAVENOUS
  Administered 2016-07-18: 10 mg via INTRAVENOUS

## 2016-07-18 MED ORDER — DIAZEPAM 5 MG PO TABS
5.0000 mg | ORAL_TABLET | Freq: Once | ORAL | Status: AC
  Administered 2016-07-18: 5 mg via ORAL

## 2016-07-18 MED ORDER — VITAMIN D 1000 UNITS PO TABS
2000.0000 [IU] | ORAL_TABLET | Freq: Every day | ORAL | Status: DC
  Administered 2016-07-19 – 2016-07-20 (×2): 2000 [IU] via ORAL
  Filled 2016-07-18 (×2): qty 2

## 2016-07-18 MED ORDER — THROMBIN 5000 UNITS EX SOLR
CUTANEOUS | Status: AC
  Filled 2016-07-18: qty 5000

## 2016-07-18 MED ORDER — MORPHINE SULFATE (PF) 4 MG/ML IV SOLN: 4.0000 mg | INTRAVENOUS | Status: DC | PRN

## 2016-07-18 MED ORDER — DEXAMETHASONE SODIUM PHOSPHATE 10 MG/ML IJ SOLN
INTRAMUSCULAR | Status: AC
  Filled 2016-07-18: qty 1

## 2016-07-18 MED ORDER — FENTANYL CITRATE (PF) 100 MCG/2ML IJ SOLN
INTRAMUSCULAR | Status: AC
  Filled 2016-07-18: qty 4

## 2016-07-18 MED ORDER — ACETAMINOPHEN 650 MG RE SUPP: 650.0000 mg | RECTAL | Status: DC | PRN

## 2016-07-18 MED ORDER — SUGAMMADEX SODIUM 200 MG/2ML IV SOLN
INTRAVENOUS | Status: DC | PRN
  Administered 2016-07-18: 200 mg via INTRAVENOUS

## 2016-07-18 MED ORDER — METOCLOPRAMIDE HCL 5 MG/ML IJ SOLN: 10.0000 mg | Freq: Once | INTRAMUSCULAR | Status: DC | PRN

## 2016-07-18 MED ORDER — DIAZEPAM 5 MG PO TABS
ORAL_TABLET | ORAL | Status: AC
  Administered 2016-07-18: 5 mg via ORAL
  Filled 2016-07-18: qty 1

## 2016-07-18 MED ORDER — DOCUSATE SODIUM 100 MG PO CAPS
100.0000 mg | ORAL_CAPSULE | Freq: Two times a day (BID) | ORAL | Status: DC
  Administered 2016-07-18 – 2016-07-20 (×4): 100 mg via ORAL
  Filled 2016-07-18 (×4): qty 1

## 2016-07-18 MED ORDER — MIDAZOLAM HCL 2 MG/2ML IJ SOLN
INTRAMUSCULAR | Status: AC
  Filled 2016-07-18: qty 2

## 2016-07-18 MED ORDER — FLEET ENEMA 7-19 GM/118ML RE ENEM: 1.0000 | ENEMA | Freq: Once | RECTAL | Status: DC | PRN

## 2016-07-18 MED ORDER — CHLORHEXIDINE GLUCONATE CLOTH 2 % EX PADS: 6.0000 | MEDICATED_PAD | Freq: Once | CUTANEOUS | Status: DC

## 2016-07-18 MED ORDER — SODIUM CHLORIDE 0.9 % IR SOLN
Status: DC | PRN
  Administered 2016-07-18: 500 mL

## 2016-07-18 MED ORDER — ALUM & MAG HYDROXIDE-SIMETH 200-200-20 MG/5ML PO SUSP: 30.0000 mL | Freq: Four times a day (QID) | ORAL | Status: DC | PRN

## 2016-07-18 MED ORDER — LIDOCAINE-EPINEPHRINE (PF) 2 %-1:200000 IJ SOLN
INTRAMUSCULAR | Status: AC
  Filled 2016-07-18: qty 20

## 2016-07-18 MED ORDER — KETOROLAC TROMETHAMINE 30 MG/ML IJ SOLN
INTRAMUSCULAR | Status: AC
  Filled 2016-07-18: qty 1

## 2016-07-18 MED ORDER — ACETAMINOPHEN 325 MG PO TABS: 650.0000 mg | ORAL_TABLET | ORAL | Status: DC | PRN

## 2016-07-18 MED ORDER — FENTANYL CITRATE (PF) 100 MCG/2ML IJ SOLN
INTRAMUSCULAR | Status: DC | PRN
  Administered 2016-07-18: 50 ug via INTRAVENOUS
  Administered 2016-07-18: 100 ug via INTRAVENOUS
  Administered 2016-07-18 (×2): 50 ug via INTRAVENOUS

## 2016-07-18 MED ORDER — METHYLPREDNISOLONE ACETATE 80 MG/ML IJ SUSP
INTRAMUSCULAR | Status: AC
  Filled 2016-07-18: qty 1

## 2016-07-18 MED ORDER — DEXAMETHASONE SODIUM PHOSPHATE 10 MG/ML IJ SOLN
INTRAMUSCULAR | Status: DC | PRN
  Administered 2016-07-18: 10 mg via INTRAVENOUS

## 2016-07-18 MED ORDER — ROCURONIUM BROMIDE 50 MG/5ML IV SOSY
PREFILLED_SYRINGE | INTRAVENOUS | Status: AC
  Filled 2016-07-18: qty 10

## 2016-07-18 MED ORDER — KCL IN DEXTROSE-NACL 20-5-0.45 MEQ/L-%-% IV SOLN: INTRAVENOUS | Status: DC

## 2016-07-18 MED ORDER — LEVALBUTEROL HCL 0.63 MG/3ML IN NEBU
INHALATION_SOLUTION | Freq: Four times a day (QID) | RESPIRATORY_TRACT | Status: DC | PRN
  Filled 2016-07-18: qty 3

## 2016-07-18 MED ORDER — HYDROCODONE-ACETAMINOPHEN 5-325 MG PO TABS
1.0000 | ORAL_TABLET | ORAL | Status: DC | PRN
Start: 2016-07-18 — End: 2016-07-18

## 2016-07-18 MED ORDER — PHENOL 1.4 % MT LIQD: 1.0000 | OROMUCOSAL | Status: DC | PRN

## 2016-07-18 MED ORDER — HYDROMORPHONE HCL 1 MG/ML IJ SOLN
INTRAMUSCULAR | Status: AC
  Filled 2016-07-18: qty 0.5

## 2016-07-18 MED ORDER — CYCLOBENZAPRINE HCL 5 MG PO TABS
5.0000 mg | ORAL_TABLET | Freq: Three times a day (TID) | ORAL | Status: DC | PRN
  Administered 2016-07-18 – 2016-07-19 (×3): 10 mg via ORAL
  Filled 2016-07-18 (×2): qty 2

## 2016-07-18 MED ORDER — MEPERIDINE HCL 25 MG/ML IJ SOLN: 6.2500 mg | INTRAMUSCULAR | Status: DC | PRN

## 2016-07-18 SURGICAL SUPPLY — 82 items
BAG DECANTER FOR FLEXI CONT (MISCELLANEOUS) ×2 IMPLANT
BASKET BONE COLLECTION (BASKET) ×2 IMPLANT
BENZOIN TINCTURE PRP APPL 2/3 (GAUZE/BANDAGES/DRESSINGS) IMPLANT
BLADE CLIPPER SURG (BLADE) IMPLANT
BUR ACRON 5.0MM COATED (BURR) ×2 IMPLANT
BUR MATCHSTICK NEURO 3.0 LAGG (BURR) ×2 IMPLANT
CANISTER SUCT 3000ML PPV (MISCELLANEOUS) ×2 IMPLANT
CAP LCK SPNE (Orthopedic Implant) ×5 IMPLANT
CAP LOCK SPINE RADIUS (Orthopedic Implant) ×5 IMPLANT
CAP LOCKING (Orthopedic Implant) ×5 IMPLANT
CARTRIDGE OIL MAESTRO DRILL (MISCELLANEOUS) ×1 IMPLANT
CONT SPEC 4OZ CLIKSEAL STRL BL (MISCELLANEOUS) ×2 IMPLANT
COVER BACK TABLE 24X17X13 BIG (DRAPES) IMPLANT
COVER BACK TABLE 60X90IN (DRAPES) ×2 IMPLANT
DERMABOND ADVANCED (GAUZE/BANDAGES/DRESSINGS) ×1
DERMABOND ADVANCED .7 DNX12 (GAUZE/BANDAGES/DRESSINGS) ×1 IMPLANT
DIFFUSER DRILL AIR PNEUMATIC (MISCELLANEOUS) ×2 IMPLANT
DRAPE C-ARM 42X72 X-RAY (DRAPES) ×6 IMPLANT
DRAPE HALF SHEET 40X57 (DRAPES) ×2 IMPLANT
DRAPE LAPAROTOMY 100X72X124 (DRAPES) ×2 IMPLANT
DRAPE POUCH INSTRU U-SHP 10X18 (DRAPES) ×2 IMPLANT
DRAPE SURG 17X23 STRL (DRAPES) ×2 IMPLANT
DURAPREP 26ML APPLICATOR (WOUND CARE) ×2 IMPLANT
ELECT REM PT RETURN 9FT ADLT (ELECTROSURGICAL) ×2
ELECTRODE REM PT RTRN 9FT ADLT (ELECTROSURGICAL) ×1 IMPLANT
GAUZE SPONGE 4X4 12PLY STRL (GAUZE/BANDAGES/DRESSINGS) ×2 IMPLANT
GAUZE SPONGE 4X4 16PLY XRAY LF (GAUZE/BANDAGES/DRESSINGS) IMPLANT
GLOVE BIOGEL PI IND STRL 7.5 (GLOVE) ×1 IMPLANT
GLOVE BIOGEL PI IND STRL 8 (GLOVE) ×2 IMPLANT
GLOVE BIOGEL PI INDICATOR 7.5 (GLOVE) ×1
GLOVE BIOGEL PI INDICATOR 8 (GLOVE) ×2
GLOVE ECLIPSE 7.5 STRL STRAW (GLOVE) ×10 IMPLANT
GLOVE ECLIPSE 9.0 STRL (GLOVE) ×2 IMPLANT
GOWN STRL REUS W/ TWL LRG LVL3 (GOWN DISPOSABLE) ×1 IMPLANT
GOWN STRL REUS W/ TWL XL LVL3 (GOWN DISPOSABLE) ×4 IMPLANT
GOWN STRL REUS W/TWL 2XL LVL3 (GOWN DISPOSABLE) ×2 IMPLANT
GOWN STRL REUS W/TWL LRG LVL3 (GOWN DISPOSABLE) ×1
GOWN STRL REUS W/TWL XL LVL3 (GOWN DISPOSABLE) ×4
KIT BASIN OR (CUSTOM PROCEDURE TRAY) ×2 IMPLANT
KIT INFUSE SMALL (Orthopedic Implant) ×2 IMPLANT
KIT POSITION SURG JACKSON T1 (MISCELLANEOUS) ×2 IMPLANT
KIT ROOM TURNOVER OR (KITS) ×2 IMPLANT
NEEDLE 18GX1X1/2 (RX/OR ONLY) (NEEDLE) ×2 IMPLANT
NEEDLE ASP BONE MRW 8GX15 (NEEDLE) ×2 IMPLANT
NEEDLE HYPO 25X1 1.5 SAFETY (NEEDLE) ×2 IMPLANT
NEEDLE SPNL 18GX3.5 QUINCKE PK (NEEDLE) IMPLANT
NEEDLE SPNL 22GX3.5 QUINCKE BK (NEEDLE) ×2 IMPLANT
NS IRRIG 1000ML POUR BTL (IV SOLUTION) ×2 IMPLANT
OIL CARTRIDGE MAESTRO DRILL (MISCELLANEOUS) ×2
PACK LAMINECTOMY NEURO (CUSTOM PROCEDURE TRAY) ×2 IMPLANT
PAD ARMBOARD 7.5X6 YLW CONV (MISCELLANEOUS) ×6 IMPLANT
PATTIES SURGICAL .5 X.5 (GAUZE/BANDAGES/DRESSINGS) ×2 IMPLANT
PATTIES SURGICAL .5 X1 (DISPOSABLE) IMPLANT
PATTIES SURGICAL 1X1 (DISPOSABLE) IMPLANT
PEEK PLIF AVS 9X25X4 (Peek) ×4 IMPLANT
ROD 5.5X60MM GREEN (Rod) ×2 IMPLANT
ROD RADIUS 35MM (Rod) ×2 IMPLANT
SCREW 5.75X40M (Screw) ×4 IMPLANT
SCREW 6.75X40MM (Screw) ×8 IMPLANT
SCREW 7.75 X 40 MM (Screw) ×2 IMPLANT
SPONGE GAUZE 4X4 12PLY STER LF (GAUZE/BANDAGES/DRESSINGS) ×2 IMPLANT
SPONGE LAP 4X18 X RAY DECT (DISPOSABLE) ×2 IMPLANT
SPONGE NEURO XRAY DETECT 1X3 (DISPOSABLE) IMPLANT
SPONGE SURGIFOAM ABS GEL 100 (HEMOSTASIS) ×2 IMPLANT
STAPLER SKIN PROX WIDE 3.9 (STAPLE) IMPLANT
STRIP BIOACTIVE VITOSS 25X100X (Neuro Prosthesis/Implant) ×2 IMPLANT
STRIP BIOACTIVE VITOSS 25X52X4 (Orthopedic Implant) ×2 IMPLANT
STRIP CLOSURE SKIN 1/2X4 (GAUZE/BANDAGES/DRESSINGS) ×2 IMPLANT
SUT PROLENE 6 0 BV (SUTURE) IMPLANT
SUT VIC AB 1 CT1 18XBRD ANBCTR (SUTURE) ×3 IMPLANT
SUT VIC AB 1 CT1 8-18 (SUTURE) ×3
SUT VIC AB 2-0 CP2 18 (SUTURE) ×4 IMPLANT
SYR 3ML LL SCALE MARK (SYRINGE) IMPLANT
SYR 5ML LL (SYRINGE) IMPLANT
SYR CONTROL 10ML LL (SYRINGE) ×2 IMPLANT
SYR INSULIN 1ML 31GX6 SAFETY (SYRINGE) IMPLANT
TAPE CLOTH SURG 4X10 WHT LF (GAUZE/BANDAGES/DRESSINGS) ×2 IMPLANT
TOWEL OR 17X24 6PK STRL BLUE (TOWEL DISPOSABLE) ×2 IMPLANT
TOWEL OR 17X26 10 PK STRL BLUE (TOWEL DISPOSABLE) ×2 IMPLANT
TRAP SPECIMEN MUCOUS 40CC (MISCELLANEOUS) ×2 IMPLANT
TRAY FOLEY W/METER SILVER 16FR (SET/KITS/TRAYS/PACK) ×2 IMPLANT
WATER STERILE IRR 1000ML POUR (IV SOLUTION) ×2 IMPLANT

## 2016-07-18 NOTE — Op Note (Signed)
07/18/2016  2:57 PM  PATIENT:  Victoria Hudson  71 y.o. female  PRE-OPERATIVE DIAGNOSIS:  L3-4 multifactorial lumbar stenosis with neurogenic claudication; lumbar spondylosis; lumbar degenerative disc disease  POST-OPERATIVE DIAGNOSIS:  L3-4 multifactorial lumbar stenosis with neurogenic claudication; lumbar spondylosis; lumbar degenerative disc disease  PROCEDURE:  Procedure(s):  1)  explantation of existing Medtronic M 10 posterior instrumentation including pedicle screws and rods at the L4-5 level; 2)  bilateral L3-4 lumbar decompression including laminectomy, facetectomy, and foraminotomies for decompression of central canal, lateral recess, and neural foraminal stenosis with decompression of the stenotic compression of the exiting L3 and L4 nerve roots bilaterally with decompression beyond that required for interbody arthrodesis; bilateral L3-4 posterior lumbar interbody arthrodesis with AVS peek interbody implants, Vitoss BA with bone marrow aspirate, and infuse; bilateral L3-4 posterior lateral arthrodesis with segmental radius posterior instrumentation (from L3 to L4 to L5), locally harvested morcellized autograft, Vitoss BA with bone marrow aspirate, and infuse  SURGEON:  Surgeon(s): Jovita Gamma, MD Earnie Larsson, MD  ASSISTANTS: Earnie Larsson, M.D.  ANESTHESIA:   general  EBL:  Total I/O In: 1100 [I.V.:1100] Out: 44 [Urine:310; Blood:100]  BLOOD ADMINISTERED:none  CELL SAVER GIVEN: Cell Saver technician felt that there was insufficient blood loss or process to collect the blood  COUNT: Correct per nursing staff  DICTATION: Patient is brought to the operating room placed under general endotracheal anesthesia. The patient was turned to prone position the lumbar region was prepped with Betadine soap and solution and draped in a sterile fashion. The midline was infiltrated with local anesthesia with epinephrine. A localizing x-ray was taken and then a midline incision was made  through the previous midline incision, and extended rostrally. It was carried down through the subcutaneous tissue, bipolar cautery and electrocautery were used to maintain hemostasis. Dissection was carried down to the lumbar fascia. The fascia was incised bilaterally and the paraspinal muscles were dissected with a spinous process and lamina in a subperiosteal fashion. Another x-ray was taken for localization and the L3-4 level was localized. Dissection was then carried out laterally over the facet complex, and we identified the existing posterior instrumentation at the L4-5 level and the transverse processes of L3 and L4 were exposed and decorticated.   Scar tissue overlying the existing posterior instrumentation at L4 and L5 was cleaned away, and then the locking caps were unlocked and removed, the rods were removed, and then all 4 screws were removed. Gelfoam with thrombin was packed into the screw holes to maintain hemostasis.  We then proceeded with the decompression. Bilateral L3 and L4 laminectomies performed using the high-speed drill and Kerrison punches. Ligament of flavum was identified, and bilateral L3 and L4 facetectomies were performed. The thickened ligamentum flavum was carefully removed, decompressing the spinal canal and thecal sac. Hypertrophic superior aspect of the superior articular process of L4 was removed decompressing the exiting L3 neural foramen and nerve root.  Once the decompression of the stenotic compression of the thecal sac and exiting nerve roots was completed we proceeded with the posterior lumbar interbody arthrodesis. The annulus was incised bilaterally and the disc space entered. A thorough discectomy was performed using pituitary rongeurs and curettes. Once the discectomy was completed we began to prepare the endplate surfaces removing the cartilaginous endplates surface.  We then measured the height of the intervertebral disc space. We selected 9 x 25 x 4 AVS peek  interbody implants.  The C-arm fluoroscope was then draped and brought in the field and we identified  the pedicle entry points bilaterally at the L3 level. Each of the 2 pedicles was probed, we aspirated bone marrow aspirate from the vertebral bodies, this was injected over a 10 cc and a 5 cc strip of Vitoss BA. Then each of the pedicles was examined with the ball probe good bony surfaces were found and no bony cuts were found. Each of the pedicles was then tapped with a 5.25 mm tap, again examined with the ball probe good threading was found and no bony cuts were found. We then placed 5.75 by 40 millimeter screws bilaterally at the L3 level.  Using the existing screw holes, 6.75 x 40 mm screws were placed bilaterally at L4 and on the left side at L5.  We then packed the AVS peek interbody implants with Vitoss BA with bone marrow aspirate and infuse, and then placed the first implant and on the right side, carefully retracting the thecal sac and nerve root medially. We then went back to the left side and packed the midline with additional Vitoss BA with bone marrow aspirate and infuse, and then placed a second implant and on the left side again retracting the thecal sac and nerve root medially. Additional Vitoss BA with bone marrow aspirate and infuse was packed lateral to the implants.  We then packed the lateral gutter over the transverse processes of L3 and L4 and the L3-4 intertransverse space with locally harvested morcellized autograft, Vitoss BA with bone marrow aspirate, and infuse. We then selected prelordosed rods, using a 16 mm rod on the left and a 35 mm rod on the right. They were placed within the screw heads and secured with locking caps once all 5 locking caps were placed final tightening was performed against a counter torque.  The wound had been irrigated multiple times during the procedure with saline solution and bacitracin solution, good hemostasis was established with a combination of  bipolar cautery and Gelfoam with thrombin. The Gelfoam was removed, and a thin layer Surgifoam applied. Once good hemostasis was confirmed we proceeded with closure paraspinal muscles deep fascia and Scarpa's fascia were closed with interrupted undyed 1 Vicryl sutures the subcutaneous and subcuticular closed with interrupted inverted 2-0 undyed Vicryl sutures the skin edges were approximated with Dermabond. We'll was dressed with sterile gauze and Hypafix.  Following surgery the patient was turned back to the supine position to be reversed and the anesthetic extubated and transferred to the recovery room for further care.  PLAN OF CARE: Admit to inpatient   PATIENT DISPOSITION:  PACU - hemodynamically stable.   Delay start of Pharmacological VTE agent (>24hrs) due to surgical blood loss or risk of bleeding:  yes

## 2016-07-18 NOTE — H&P (Signed)
Subjective: Patient is a 71 y.o. female left handed white who is admitted for treatment of multifactorial lumbar stenosis with associated radiculopathy, right worse than left, consistent with neurogenic claudication. She is a former patient of Dr. Joya Salm, who treated her for over 20 years, and who did lumbar surgery in 1997, and L4-5 lumbar decompression, PLIF, and PLA in 2003. The latter surgery was complicated by a dural tear, which Dr. Joya Salm repaired intraoperatively. He also performed a 3 level CIV-5, C5-6, C6-7 ACDF with bone graft and plating in November 2016. History is notable for right-sided low back pain, rating down to the lateral right thigh, and into the anterior right knee and leg, with tingling in the anterior right leg. She has difficulty lifting up the right thigh. She denies any left lower extremity symptoms.  Dr. Joya Salm evaluated her in December 2017, and referred her for a cervical and lumbar myelogram and post milligrams CT scan almond she return for evaluation with me last month, subsequent to his retirement.    [place] CT showed evidence of the previous decompression and arthrodesis at L4-5, as well as evidence of the dural repair 15 years ago. The most notable finding though is of advanced degeneration at the L3-4 level, with loss of disc space height, worse on the right than left, with ventral spurring and endplate irregularity consistent with spondylosis and degenerative disease. There is marked multifactorial lumbar stenosis with bilateral lateral recess stenosis, right worse than left, corresponding to her right lumbar radiculopathy. Mild degenerative changes seen at the L2-3 level.  Patient is admitted now for decompression and stabilization at the L3-4 level including laminectomy, facetectomy, and foraminotomy, along with L3-4 posterior lumbar interbody arthrodesis with interbody implants and bone graft, and posterior lateral arthrodesis with posterior instrumentation and bone graft.  We will plan on removing her existing instrumentation at the L4-5 level.   Patient Active Problem List   Diagnosis Date Noted  . Cervical stenosis of spinal canal 05/06/2015   Past Medical History:  Diagnosis Date  . Arthritis   . Asthma    " allergies"  . Chronic back pain   . Complication of anesthesia 1968   "fought the anesthesia" problems with memory 05/06/15 at [facility]"  . Depression   . Fibromyalgia   . GERD (gastroesophageal reflux disease)   . History of hiatal hernia   . Hypertension   . Hypothyroidism   . PONV (postoperative nausea and vomiting)   . Thyroid disease   . Torn meniscus    left knee  . Wears glasses     Past Surgical History:  Procedure Laterality Date  . ABDOMINAL HYSTERECTOMY    . ANTERIOR CERVICAL DECOMP/DISCECTOMY FUSION N/A 05/06/2015   Procedure: Cervical four-five, Cervical five-six, Cervical six-seven Anterior cervical decompression/diskectomy/fusion;  Surgeon: Leeroy Cha, MD;  Location: [facility];  Service: Neurosurgery;  Laterality: N/A;  C4-5 C5-6 C6-7 Anterior cervical decompression/diskectomy/fusion  . APPENDECTOMY    . BREAST SURGERY Bilateral    biopsy- 2 different times  . CARPAL TUNNEL RELEASE     B/L  . CHOLECYSTECTOMY    . COLONOSCOPY  2014  . COLONOSCOPY W/ POLYPECTOMY    . dental implants    . DILATION AND CURETTAGE OF UTERUS  1968  . ELBOW ARTHROSCOPY Right    right  . FOOT GANGLION EXCISION Left    left  . KNEE ARTHROSCOPY Right    torn ligament  . LAMINECTOMY     L 4 and 5; 2 separate surgeries  .  left hand     carpal tunel  . right hand     3 surgeries- carpal tunel    Prescriptions Prior to Admission  Medication Sig Dispense Refill Last Dose  . Cholecalciferol (D3 SUPER STRENGTH) 2000 UNITS CAPS Take 2,000 Units by mouth daily.    07/13/2016 at Unknown time  . clindamycin (CLEOCIN) 150 MG capsule Take 150 mg by mouth 3 (three) times daily.     . cyanocobalamin 2000 MCG tablet Take 2,000 mcg by mouth  daily.   07/17/2016 at Unknown time  . cyclobenzaprine (FLEXERIL) 10 MG tablet Take 10 mg by mouth 3 (three) times daily as needed for muscle spasms.    07/17/2016 at Unknown time  . diazepam (VALIUM) 10 MG tablet Take 5 mg by mouth every other day as needed for muscle spasms.   07/12/2016  . levothyroxine (SYNTHROID, LEVOTHROID) 50 MCG tablet Take 50 mcg by mouth every morning.    07/18/2016 at Unknown time  . losartan (COZAAR) 100 MG tablet Take 100 mg by mouth daily.   07/18/2016 at Unknown time  . magnesium oxide (MAG-OX) 400 MG tablet Take 400 mg by mouth daily.   07/17/2016 at Unknown time  . metoprolol succinate (TOPROL-XL) 50 MG 24 hr tablet Take 25 mg by mouth at bedtime. Take with or immediately following a meal.    07/17/2016 at 2200  . mometasone (NASONEX) 50 MCG/ACT nasal spray Place 2 sprays into the nose daily as needed (allergies).   07/17/2016 at Unknown time  . omeprazole (PRILOSEC) 20 MG capsule Take 20 mg by mouth every morning.    07/18/2016 at Unknown time  . OVER THE COUNTER MEDICATION Take 1 tablet by mouth 3 (three) times daily. Replenex Glucosamine and other ingredients.   07/17/2016 at Unknown time  . aspirin EC 81 MG tablet Take 81 mg by mouth daily.    07/15/2016  . levalbuterol (XOPENEX HFA) 45 MCG/ACT inhaler Inhale 2 puffs into the lungs every 6 (six) hours as needed for wheezing or shortness of breath.    More than a month at Unknown time   Allergies  Allergen Reactions  . Beta Adrenergic Blockers Other (See Comments)    Certain beta blockers make me hoarse.  . Mobic [Meloxicam] Hypertension    Made blood pressure extremely high  . Sulfa Antibiotics Itching and Swelling  . Tape Rash    Paper tape  . Aspirin Other (See Comments)    Stomach burning; increased acid  . Oxycodone Other (See Comments)    Hallucinations, knocks her out    Social History  Substance Use Topics  . Smoking status: Never Smoker  . Smokeless tobacco: Never Used  . Alcohol use No    Family  History  Problem Relation Age of Onset  . Hypertension Mother   . Lupus Mother   . Hypertension Father   . Heart disease Father   . Lupus Father   . Lupus Sister   . Colon cancer Neg Hx      Review of Systems A comprehensive review of systems was negative.  Objective: Vital signs in last 24 hours: Temp:  [98 F (36.7 C)] 98 F (36.7 C) (02/12 0816) Pulse Rate:  [71] 71 (02/12 0816) Resp:  [20] 20 (02/12 0816) BP: (186)/(104) 186/104 (02/12 0817) SpO2:  [99 %] 99 % (02/12 0816)  EXAM: Patient is a well-developed well-nourished white female in no acute distress. Lungs are clear to auscultation , the patient has symmetrical respiratory excursion. Heart  has a regular rate and rhythm normal S1 and S2 no murmur.   Abdomen is soft nontender nondistended bowel sounds are present. Extremity examination shows no clubbing cyanosis or edema. Motor examination shows 5 over 5 strength in the lower extremities including the iliopsoas quadriceps dorsiflexor extensor hallicus  longus and plantar flexor bilaterally. Sensation is intact to pinprick in the distal lower extremities. Reflex examination shows the left quadriceps and gastrocnemius are 2, and the right quadriceps is trace, and the right gastric name is his 1. No pathologic reflexes are present. Gait and stance favor the right lower extremity.   Data Review:CBC    Component Value Date/Time   WBC 7.5 05/16/2015 0430   RBC 4.18 05/16/2015 0430   HGB 12.7 05/16/2015 0430   HCT 37.3 05/16/2015 0430   PLT 313 05/16/2015 0430   MCV 89.2 05/16/2015 0430   MCH 30.4 05/16/2015 0430   MCHC 34.0 05/16/2015 0430   RDW 12.8 05/16/2015 0430   LYMPHSABS 2.3 05/16/2015 0430   MONOABS 0.5 05/16/2015 0430   EOSABS 0.0 05/16/2015 0430   BASOSABS 0.0 05/16/2015 0430                          BMET    Component Value Date/Time   NA 136 05/16/2015 0430   K 4.3 05/16/2015 0430   CL 102 05/16/2015 0430   CO2 28 05/16/2015 0430   GLUCOSE 116 (H)  05/16/2015 0430   BUN 9 05/16/2015 0430   CREATININE 0.83 05/16/2015 0430   CALCIUM 9.3 05/16/2015 0430   GFRNONAA >60 05/16/2015 0430   GFRAA >60 05/16/2015 0430     Assessment/Plan: Patient with a well-healed fusion at the L4-5 level, who presented with the low back and radicular pain, has been found to have adjacent segment degeneration with marked multifactorial lumbar stenosis at the L3-4 level, worse on the right than the left, corresponding to her symptoms being worse on the right than the left. She is admitted now for a L3-4 lumbar decompression and arthrodesis.  I've discussed with the patient the nature of his condition, the nature the surgical procedure, the typical length of surgery, hospital stay, and overall recuperation, the limitations postoperatively, and risks of surgery. I discussed risks including risks of infection, bleeding, possibly need for transfusion, the risk of nerve root dysfunction with pain, weakness, numbness, or paresthesias, the risk of dural tear and CSF leakage and possible need for further surgery, the risk of failure of the arthrodesis and possibly for further surgery, the risk of anesthetic complications including myocardial infarction, stroke, pneumonia, and death. We discussed the need for postoperative immobilization in a lumbar brace. Understanding all this the patient does wish to proceed with surgery and is admitted for such.     Hosie Spangle, MD 07/18/2016 9:40 AM

## 2016-07-18 NOTE — Anesthesia Procedure Notes (Signed)
Procedure Name: Intubation Date/Time: 07/18/2016 10:20 AM Performed by: Candis Shine Pre-anesthesia Checklist: Patient identified, Emergency Drugs available, Suction available and Patient being monitored Patient Re-evaluated:Patient Re-evaluated prior to inductionOxygen Delivery Method: Circle System Utilized Preoxygenation: Pre-oxygenation with 100% oxygen Intubation Type: IV induction Ventilation: Mask ventilation without difficulty Laryngoscope Size: Glidescope and 3 Grade View: Grade I Tube type: Oral Tube size: 7.0 mm Number of attempts: 2 Airway Equipment and Method: Stylet and Oral airway Placement Confirmation: ETT inserted through vocal cords under direct vision,  positive ETCO2 and breath sounds checked- equal and bilateral Secured at: 21 cm Tube secured with: Tape Dental Injury: Teeth and Oropharynx as per pre-operative assessment  Comments: DL x 1 with Mac 3, grade III view, limited neck mobility. Glidescope 3 utilized with grade I view, 7.0 ETT placed atraumatically through open vocal cords.

## 2016-07-18 NOTE — Transfer of Care (Signed)
Immediate Anesthesia Transfer of Care Note  Patient: Victoria Hudson  Procedure(s) Performed: Procedure(s): LUMBAR THREE - LUMBAR FOUR DECOMPRESSION, POSTERIOR LUMBAR INTERBODY FUSION AND POSTERIOR LATERAL ARTHRODESIS; REMOVAL OF EXISTING LUMBAR FOUR LUMBAR FIVE  POSTERIOR INSTRUMENTATION (N/A)  Patient Location: PACU  Anesthesia Type:General  Level of Consciousness: awake, alert  and oriented  Airway & Oxygen Therapy: Patient Spontanous Breathing and Patient connected to nasal cannula oxygen  Post-op Assessment: Report given to RN and Post -op Vital signs reviewed and stable  Post vital signs: Reviewed and stable  Last Vitals:  Vitals:   07/18/16 0817 07/18/16 1444  BP: (!) 186/104   Pulse:    Resp:    Temp:  36.7 C    Last Pain:  Vitals:   07/18/16 0924  TempSrc:   PainSc: 9       Patients Stated Pain Goal: 7 ([id number])  Complications: No apparent anesthesia complications

## 2016-07-18 NOTE — Progress Notes (Signed)
Vitals:   07/18/16 1545 07/18/16 1600 07/18/16 1615 07/18/16 1651  BP: 134/73 138/75 (!) 150/81 (!) 148/82  Pulse: 83 84  90  Resp: 13 16 19 18   Temp:   98.5 F (36.9 C) 97.9 F (36.6 C)  TempSrc:      SpO2: 99% 100% 100% 100%    CBC  Recent Labs  07/18/16 0919  WBC 5.1  HGB 12.2  HCT 37.2  PLT 304   BMET  Recent Labs  07/18/16 0919  NA 142  K 3.8  CL 109  CO2 25  GLUCOSE 91  BUN 13  CREATININE 0.91  CALCIUM 9.6    Patient resting in bed. Comfortable. Excellent relief of right lumbar radicular pain. Dressing clean and dry. Foley to straight drainage. Has not yet ambulated. Moving all 4 extremities well.  Plan: Encouraged to ambulate with the staff. Continue progress through postoperative recovery. DC Foley once up and ambulating.  Hosie Spangle, MD 07/18/2016, 5:36 PM

## 2016-07-18 NOTE — Anesthesia Postprocedure Evaluation (Signed)
Anesthesia Post Note  Patient: Victoria Hudson  Procedure(s) Performed: Procedure(s) (LRB): LUMBAR THREE - LUMBAR FOUR DECOMPRESSION, POSTERIOR LUMBAR INTERBODY FUSION AND POSTERIOR LATERAL ARTHRODESIS; REMOVAL OF EXISTING LUMBAR FOUR LUMBAR FIVE  POSTERIOR INSTRUMENTATION (N/A)  Patient location during evaluation: PACU Anesthesia Type: General Level of consciousness: awake and alert and oriented Pain management: pain level controlled Vital Signs Assessment: post-procedure vital signs reviewed and stable Respiratory status: spontaneous breathing, nonlabored ventilation, respiratory function stable and patient connected to nasal cannula oxygen Cardiovascular status: blood pressure returned to baseline and stable Postop Assessment: no signs of nausea or vomiting Anesthetic complications: no       Last Vitals:  Vitals:   07/18/16 1615 07/18/16 1651  BP: (!) 150/81 (!) 148/82  Pulse:  90  Resp: 19 18  Temp: 36.9 C 36.6 C    Last Pain:  Vitals:   07/18/16 1615  TempSrc:   PainSc: 4                  Stephenia Vogan A.

## 2016-07-18 NOTE — Anesthesia Preprocedure Evaluation (Addendum)
Anesthesia Evaluation  Patient identified by MRN, date of birth, ID band Patient awake    Reviewed: Allergy & Precautions, NPO status , Patient's Chart, lab work & pertinent test results, reviewed documented beta blocker date and time   History of Anesthesia Complications (+) PONV and history of anesthetic complications  Airway Mallampati: II  TM Distance: >3 FB Neck ROM: Full    Dental no notable dental hx. (+) Caps Implants:   Pulmonary asthma ,   Hoarseness of Voice Pulmonary exam normal breath sounds clear to auscultation       Cardiovascular hypertension, Pt. on medications and Pt. on home beta blockers Normal cardiovascular exam Rhythm:Regular Rate:Normal     Neuro/Psych PSYCHIATRIC DISORDERS Depression negative neurological ROS     GI/Hepatic Neg liver ROS, hiatal hernia, GERD  Medicated and Controlled,  Endo/Other  Hypothyroidism Obesity  Renal/GU negative Renal ROS  negative genitourinary   Musculoskeletal  (+) Arthritis , Osteoarthritis,  Fibromyalgia -Lumbar spinal stenosis- L3-4, L4-5 with neurogenic claudication   Abdominal (+) + obese,   Peds  Hematology   Anesthesia Other Findings   Reproductive/Obstetrics                            EKG: normal sinus rhythm, incomplete RBBB pattern.  Lab Results  Component Value Date   WBC 7.5 05/16/2015   HGB 12.7 05/16/2015   HCT 37.3 05/16/2015   MCV 89.2 05/16/2015   PLT 313 05/16/2015     Chemistry      Component Value Date/Time   NA 136 05/16/2015 0430   K 4.3 05/16/2015 0430   CL 102 05/16/2015 0430   CO2 28 05/16/2015 0430   BUN 9 05/16/2015 0430   CREATININE 0.83 05/16/2015 0430      Component Value Date/Time   CALCIUM 9.3 05/16/2015 0430   ALKPHOS 83 12/01/2008 2117   AST 32 12/01/2008 2117   ALT 26 12/01/2008 2117   BILITOT 0.7 12/01/2008 2117       Anesthesia Physical Anesthesia Plan  ASA:  III  Anesthesia Plan: General   Post-op Pain Management:    Induction: Intravenous  Airway Management Planned: Oral ETT  Additional Equipment:   Intra-op Plan:   Post-operative Plan: Extubation in OR  Informed Consent: I have reviewed the patients History and Physical, chart, labs and discussed the procedure including the risks, benefits and alternatives for the proposed anesthesia with the patient or authorized representative who has indicated his/her understanding and acceptance.   Dental advisory given  Plan Discussed with: CRNA, Anesthesiologist and Surgeon  Anesthesia Plan Comments:         Anesthesia Quick Evaluation

## 2016-07-19 NOTE — Progress Notes (Signed)
Vitals:   07/18/16 2000 07/19/16 0000 07/19/16 0400 07/19/16 0823  BP: 138/81 (!) 147/83 106/61 (!) 112/50  Pulse: (!) 103 96 82 82  Resp: 18 20 18 20   Temp: 97.9 F (36.6 C) 97.5 F (36.4 C) 98.2 F (36.8 C) 98 F (36.7 C)  TempSrc: Oral Oral Oral Oral  SpO2: 94% 96% 95% 97%    CBC  Recent Labs  07/18/16 0919  WBC 5.1  HGB 12.2  HCT 37.2  PLT 304   BMET  Recent Labs  07/18/16 0919  NA 142  K 3.8  CL 109  CO2 25  GLUCOSE 91  BUN 13  CREATININE 0.91  CALCIUM 9.6    Patient resting in bed comfortably, has been ambulating with the staff. Dressing clean and dry. Voiding well since Foley DC'd. Spoke with patient and 2 of her daughters regarding medication management, wound care, and activities following discharge. Their questions regarding my instructions were answered for them.  Plan: Encouraged to ambulate at least 5 more times today. Continue to progress through postoperative recovery.  Hosie Spangle, MD 07/19/2016, 9:33 AM

## 2016-07-20 MED ORDER — OXYCODONE-ACETAMINOPHEN 5-325 MG PO TABS: 1.0000 | ORAL_TABLET | ORAL | 0 refills | Status: DC | PRN

## 2016-07-20 MED FILL — Heparin Sodium (Porcine) Inj 1000 Unit/ML: INTRAMUSCULAR | Qty: 30 | Status: AC

## 2016-07-20 MED FILL — Sodium Chloride IV Soln 0.9%: INTRAVENOUS | Qty: 1000 | Status: AC

## 2016-07-20 NOTE — Discharge Instructions (Signed)

## 2016-07-20 NOTE — Discharge Summary (Signed)
Physician Discharge Summary  Patient ID: RICHIE GARRELL MRN: [id number] DOB/AGE: 1946-05-31 71 y.o.  Admit date: 07/18/2016 Discharge date: 07/20/2016  Admission Diagnoses:  L3-4 multifactorial lumbar stenosis with neurogenic claudication; lumbar spondylosis; lumbar degenerative disc disease  Discharge Diagnoses:  L3-4 multifactorial lumbar stenosis with neurogenic claudication; lumbar spondylosis; lumbar degenerative disc disease Active Problems:   Lumbar stenosis with neurogenic claudication   Discharged Condition: good  Hospital Course: Patient admitted, underwent decompression of spinal stenosis at the L3-4 level along with stabilization via an L3-4 PLIF and PLA.  Postoperatively she is done well. She's been up and ambulating actively in the halls. Her wound is healing nicely; there is no erythema, swelling, drainage, or ecchymosis. She is asking to be discharged to home. She and her family and given instructions regarding wound care and activities following discharge. She is scheduled to follow-up with me in the office in 3 weeks.  Discharge Exam: Blood pressure 121/60, pulse 96, temperature 98.2 F (36.8 C), temperature source Oral, resp. rate 18, SpO2 96 %.  Disposition: 01-Home or Self Care  Discharge Instructions    Discharge wound care:    Complete by:  As directed    Leave the wound open to air. Shower daily with the wound uncovered. Water and soapy water should run over the incision area. Do not wash directly on the incision for 2 weeks. Remove the glue after 2 weeks.   Driving Restrictions    Complete by:  As directed    No driving for 2 weeks. May ride in the car locally now. May begin to drive locally in 2 weeks.   Other Restrictions    Complete by:  As directed    Walk gradually increasing distances out in the fresh air at least twice a day. Walking additional 6 times inside the house, gradually increasing distances, daily. No bending, lifting, or twisting.  Perform activities between shoulder and waist height (that is at counter height when standing or table height when sitting).     Allergies as of 07/20/2016      Reactions   Beta Adrenergic Blockers Other (See Comments)   Certain beta blockers make me hoarse.   Mobic [meloxicam] Hypertension   Made blood pressure extremely high   Sulfa Antibiotics Itching, Swelling   Tape Rash   Paper tape   Aspirin Other (See Comments)   Stomach burning; increased acid   Oxycodone Other (See Comments)   Hallucinations, knocks her out      Medication List    STOP taking these medications   clindamycin 150 MG capsule Commonly known as:  CLEOCIN     TAKE these medications   aspirin EC 81 MG tablet Take 81 mg by mouth daily.   cyanocobalamin 2000 MCG tablet Take 2,000 mcg by mouth daily.   cyclobenzaprine 10 MG tablet Commonly known as:  FLEXERIL Take 10 mg by mouth 3 (three) times daily as needed for muscle spasms.   D3 SUPER STRENGTH 2000 units Caps Generic drug:  Cholecalciferol Take 2,000 Units by mouth daily.   diazepam 10 MG tablet Commonly known as:  VALIUM Take 5 mg by mouth every other day as needed for muscle spasms.   levalbuterol 45 MCG/ACT inhaler Commonly known as:  XOPENEX HFA Inhale 2 puffs into the lungs every 6 (six) hours as needed for wheezing or shortness of breath.   levothyroxine 50 MCG tablet Commonly known as:  SYNTHROID, LEVOTHROID Take 50 mcg by mouth every morning.   losartan 100 MG  tablet Commonly known as:  COZAAR Take 100 mg by mouth daily.   magnesium oxide 400 MG tablet Commonly known as:  MAG-OX Take 400 mg by mouth daily.   metoprolol succinate 50 MG 24 hr tablet Commonly known as:  TOPROL-XL Take 25 mg by mouth at bedtime. Take with or immediately following a meal.   mometasone 50 MCG/ACT nasal spray Commonly known as:  NASONEX Place 2 sprays into the nose daily as needed (allergies).   omeprazole 20 MG capsule Commonly known as:   PRILOSEC Take 20 mg by mouth every morning.   OVER THE COUNTER MEDICATION Take 1 tablet by mouth 3 (three) times daily. Replenex Glucosamine and other ingredients.   oxyCODONE-acetaminophen 5-325 MG tablet Commonly known as:  PERCOCET/ROXICET Take 1-2 tablets by mouth every 4 (four) hours as needed for moderate pain or severe pain.        SignedHosie Spangle 07/20/2016, 5:12 PM

## 2016-07-20 NOTE — Progress Notes (Signed)
Patient alert and oriented, mae's well, voiding adequate amount of urine, swallowing without difficulty, c/o mildpain. Patient discharged home with family. Script and discharged instructions given to patient. Patient and family stated understanding of d/c instructions given and has an appointment with Dr. Nudelman 

## 2016-07-25 DIAGNOSIS — Z09 Encounter for follow-up examination after completed treatment for conditions other than malignant neoplasm: Secondary | ICD-10-CM | POA: Diagnosis not present

## 2016-07-25 DIAGNOSIS — M797 Fibromyalgia: Secondary | ICD-10-CM | POA: Diagnosis not present

## 2016-07-25 DIAGNOSIS — L989 Disorder of the skin and subcutaneous tissue, unspecified: Secondary | ICD-10-CM | POA: Diagnosis not present

## 2016-07-25 DIAGNOSIS — G4700 Insomnia, unspecified: Secondary | ICD-10-CM | POA: Diagnosis not present

## 2016-08-12 DIAGNOSIS — M48062 Spinal stenosis, lumbar region with neurogenic claudication: Secondary | ICD-10-CM | POA: Diagnosis not present

## 2016-09-08 DIAGNOSIS — J309 Allergic rhinitis, unspecified: Secondary | ICD-10-CM | POA: Diagnosis not present

## 2016-09-08 DIAGNOSIS — J45909 Unspecified asthma, uncomplicated: Secondary | ICD-10-CM | POA: Diagnosis not present

## 2016-09-08 DIAGNOSIS — D229 Melanocytic nevi, unspecified: Secondary | ICD-10-CM | POA: Diagnosis not present

## 2016-10-20 DIAGNOSIS — H5203 Hypermetropia, bilateral: Secondary | ICD-10-CM | POA: Diagnosis not present

## 2016-10-20 DIAGNOSIS — H52223 Regular astigmatism, bilateral: Secondary | ICD-10-CM | POA: Diagnosis not present

## 2016-10-20 DIAGNOSIS — H2513 Age-related nuclear cataract, bilateral: Secondary | ICD-10-CM | POA: Diagnosis not present

## 2016-10-21 DIAGNOSIS — Z981 Arthrodesis status: Secondary | ICD-10-CM | POA: Diagnosis not present

## 2016-10-21 DIAGNOSIS — M5136 Other intervertebral disc degeneration, lumbar region: Secondary | ICD-10-CM | POA: Diagnosis not present

## 2016-10-21 DIAGNOSIS — M4726 Other spondylosis with radiculopathy, lumbar region: Secondary | ICD-10-CM | POA: Diagnosis not present

## 2016-10-21 DIAGNOSIS — Z6832 Body mass index (BMI) 32.0-32.9, adult: Secondary | ICD-10-CM | POA: Diagnosis not present

## 2016-11-01 ENCOUNTER — Telehealth: Payer: Self-pay | Admitting: Family Medicine

## 2016-11-01 NOTE — Telephone Encounter (Signed)
Patient is requesting to become a patient of dr Dwyane Dee. Currently on synthroid for 3 years.   Verified cell # on file

## 2016-11-11 DIAGNOSIS — Z1389 Encounter for screening for other disorder: Secondary | ICD-10-CM | POA: Diagnosis not present

## 2016-11-11 DIAGNOSIS — J45909 Unspecified asthma, uncomplicated: Secondary | ICD-10-CM | POA: Diagnosis not present

## 2016-11-11 DIAGNOSIS — Z0000 Encounter for general adult medical examination without abnormal findings: Secondary | ICD-10-CM | POA: Diagnosis not present

## 2016-11-11 DIAGNOSIS — J309 Allergic rhinitis, unspecified: Secondary | ICD-10-CM | POA: Diagnosis not present

## 2016-11-11 DIAGNOSIS — K219 Gastro-esophageal reflux disease without esophagitis: Secondary | ICD-10-CM | POA: Diagnosis not present

## 2016-11-11 DIAGNOSIS — G4700 Insomnia, unspecified: Secondary | ICD-10-CM | POA: Diagnosis not present

## 2016-11-11 DIAGNOSIS — M1990 Unspecified osteoarthritis, unspecified site: Secondary | ICD-10-CM | POA: Diagnosis not present

## 2016-11-11 DIAGNOSIS — M797 Fibromyalgia: Secondary | ICD-10-CM | POA: Diagnosis not present

## 2016-11-11 DIAGNOSIS — E039 Hypothyroidism, unspecified: Secondary | ICD-10-CM | POA: Diagnosis not present

## 2016-11-11 DIAGNOSIS — I10 Essential (primary) hypertension: Secondary | ICD-10-CM | POA: Diagnosis not present

## 2016-12-14 DIAGNOSIS — M25562 Pain in left knee: Secondary | ICD-10-CM | POA: Diagnosis not present

## 2017-02-24 DIAGNOSIS — M5136 Other intervertebral disc degeneration, lumbar region: Secondary | ICD-10-CM | POA: Diagnosis not present

## 2017-02-24 DIAGNOSIS — M47816 Spondylosis without myelopathy or radiculopathy, lumbar region: Secondary | ICD-10-CM | POA: Diagnosis not present

## 2017-02-24 DIAGNOSIS — R030 Elevated blood-pressure reading, without diagnosis of hypertension: Secondary | ICD-10-CM | POA: Diagnosis not present

## 2017-02-24 DIAGNOSIS — Z981 Arthrodesis status: Secondary | ICD-10-CM | POA: Diagnosis not present

## 2017-03-09 DIAGNOSIS — Z1231 Encounter for screening mammogram for malignant neoplasm of breast: Secondary | ICD-10-CM | POA: Diagnosis not present

## 2017-03-09 DIAGNOSIS — Z803 Family history of malignant neoplasm of breast: Secondary | ICD-10-CM | POA: Diagnosis not present

## 2017-05-26 DIAGNOSIS — J322 Chronic ethmoidal sinusitis: Secondary | ICD-10-CM | POA: Diagnosis not present

## 2017-05-26 DIAGNOSIS — J320 Chronic maxillary sinusitis: Secondary | ICD-10-CM | POA: Diagnosis not present

## 2017-05-26 DIAGNOSIS — J370 Chronic laryngitis: Secondary | ICD-10-CM | POA: Diagnosis not present

## 2017-05-29 DIAGNOSIS — M25512 Pain in left shoulder: Secondary | ICD-10-CM | POA: Diagnosis not present

## 2017-06-01 DIAGNOSIS — I10 Essential (primary) hypertension: Secondary | ICD-10-CM | POA: Diagnosis not present

## 2017-06-01 DIAGNOSIS — E039 Hypothyroidism, unspecified: Secondary | ICD-10-CM | POA: Diagnosis not present

## 2017-06-12 DIAGNOSIS — L918 Other hypertrophic disorders of the skin: Secondary | ICD-10-CM | POA: Diagnosis not present

## 2017-06-12 DIAGNOSIS — L538 Other specified erythematous conditions: Secondary | ICD-10-CM | POA: Diagnosis not present

## 2017-06-15 DIAGNOSIS — J040 Acute laryngitis: Secondary | ICD-10-CM | POA: Diagnosis not present

## 2017-06-15 DIAGNOSIS — J322 Chronic ethmoidal sinusitis: Secondary | ICD-10-CM | POA: Diagnosis not present

## 2017-06-15 DIAGNOSIS — J320 Chronic maxillary sinusitis: Secondary | ICD-10-CM | POA: Diagnosis not present

## 2017-10-09 DIAGNOSIS — M65311 Trigger thumb, right thumb: Secondary | ICD-10-CM | POA: Diagnosis not present

## 2017-11-15 DIAGNOSIS — M797 Fibromyalgia: Secondary | ICD-10-CM | POA: Diagnosis not present

## 2017-11-15 DIAGNOSIS — G4700 Insomnia, unspecified: Secondary | ICD-10-CM | POA: Diagnosis not present

## 2017-11-15 DIAGNOSIS — E039 Hypothyroidism, unspecified: Secondary | ICD-10-CM | POA: Diagnosis not present

## 2017-11-15 DIAGNOSIS — I10 Essential (primary) hypertension: Secondary | ICD-10-CM | POA: Diagnosis not present

## 2017-11-15 DIAGNOSIS — Z1389 Encounter for screening for other disorder: Secondary | ICD-10-CM | POA: Diagnosis not present

## 2017-11-15 DIAGNOSIS — J45909 Unspecified asthma, uncomplicated: Secondary | ICD-10-CM | POA: Diagnosis not present

## 2017-11-15 DIAGNOSIS — M1990 Unspecified osteoarthritis, unspecified site: Secondary | ICD-10-CM | POA: Diagnosis not present

## 2017-11-15 DIAGNOSIS — Z800 Family history of malignant neoplasm of digestive organs: Secondary | ICD-10-CM | POA: Diagnosis not present

## 2017-11-15 DIAGNOSIS — Z0000 Encounter for general adult medical examination without abnormal findings: Secondary | ICD-10-CM | POA: Diagnosis not present

## 2017-11-15 DIAGNOSIS — R112 Nausea with vomiting, unspecified: Secondary | ICD-10-CM | POA: Diagnosis not present

## 2017-11-15 DIAGNOSIS — K219 Gastro-esophageal reflux disease without esophagitis: Secondary | ICD-10-CM | POA: Diagnosis not present

## 2017-11-15 DIAGNOSIS — J309 Allergic rhinitis, unspecified: Secondary | ICD-10-CM | POA: Diagnosis not present

## 2017-12-26 DIAGNOSIS — M8588 Other specified disorders of bone density and structure, other site: Secondary | ICD-10-CM | POA: Diagnosis not present

## 2018-01-05 ENCOUNTER — Other Ambulatory Visit: Payer: Self-pay | Admitting: Gastroenterology

## 2018-01-05 DIAGNOSIS — Z800 Family history of malignant neoplasm of digestive organs: Secondary | ICD-10-CM | POA: Diagnosis not present

## 2018-01-05 DIAGNOSIS — R1310 Dysphagia, unspecified: Secondary | ICD-10-CM

## 2018-01-10 ENCOUNTER — Ambulatory Visit
Admission: RE | Admit: 2018-01-10 | Discharge: 2018-01-10 | Disposition: A | Discharge status: Home / Self Care | Payer: PPO | Source: Ambulatory Visit | Attending: Gastroenterology | Admitting: Gastroenterology

## 2018-01-10 DIAGNOSIS — R1310 Dysphagia, unspecified: Secondary | ICD-10-CM | POA: Diagnosis not present

## 2018-02-22 DIAGNOSIS — K219 Gastro-esophageal reflux disease without esophagitis: Secondary | ICD-10-CM | POA: Diagnosis not present

## 2018-02-22 DIAGNOSIS — Z1211 Encounter for screening for malignant neoplasm of colon: Secondary | ICD-10-CM | POA: Diagnosis not present

## 2018-02-22 DIAGNOSIS — R1310 Dysphagia, unspecified: Secondary | ICD-10-CM | POA: Diagnosis not present

## 2018-02-22 DIAGNOSIS — Z800 Family history of malignant neoplasm of digestive organs: Secondary | ICD-10-CM | POA: Diagnosis not present

## 2018-02-27 DIAGNOSIS — M47816 Spondylosis without myelopathy or radiculopathy, lumbar region: Secondary | ICD-10-CM | POA: Diagnosis not present

## 2018-02-27 DIAGNOSIS — M5136 Other intervertebral disc degeneration, lumbar region: Secondary | ICD-10-CM | POA: Diagnosis not present

## 2018-02-27 DIAGNOSIS — Z981 Arthrodesis status: Secondary | ICD-10-CM | POA: Diagnosis not present

## 2018-03-31 DIAGNOSIS — M79642 Pain in left hand: Secondary | ICD-10-CM | POA: Diagnosis not present

## 2018-03-31 DIAGNOSIS — M79641 Pain in right hand: Secondary | ICD-10-CM | POA: Diagnosis not present

## 2018-05-10 DIAGNOSIS — M65332 Trigger finger, left middle finger: Secondary | ICD-10-CM | POA: Diagnosis not present

## 2018-05-10 DIAGNOSIS — M65849 Other synovitis and tenosynovitis, unspecified hand: Secondary | ICD-10-CM | POA: Diagnosis not present

## 2018-05-18 DIAGNOSIS — E039 Hypothyroidism, unspecified: Secondary | ICD-10-CM | POA: Diagnosis not present

## 2018-05-18 DIAGNOSIS — I10 Essential (primary) hypertension: Secondary | ICD-10-CM | POA: Diagnosis not present

## 2018-05-18 DIAGNOSIS — M6530 Trigger finger, unspecified finger: Secondary | ICD-10-CM | POA: Diagnosis not present

## 2019-01-17 ENCOUNTER — Other Ambulatory Visit: Payer: Self-pay | Admitting: Radiology

## 2019-01-23 LAB — CBC AND DIFFERENTIAL
HCT: 37 (ref 36–46)
Hemoglobin: 11.8 — AB (ref 12.0–16.0)
Platelets: 324 (ref 150–399)
WBC: 7.8

## 2019-01-23 LAB — BASIC METABOLIC PANEL
BUN: 17 (ref 4–21)
CO2: 29 — AB (ref 13–22)
Chloride: 106 (ref 99–108)
Creatinine: 0.9 (ref 0.5–1.1)
Glucose: 100
Potassium: 4.2 (ref 3.4–5.3)
Sodium: 141 (ref 137–147)

## 2019-01-23 LAB — COMPREHENSIVE METABOLIC PANEL
Albumin: 3.9 (ref 3.5–5.0)
Calcium: 9.4 (ref 8.7–10.7)

## 2019-01-23 LAB — HEPATIC FUNCTION PANEL
ALT: 27 (ref 7–35)
AST: 25 (ref 13–35)

## 2019-01-23 LAB — CBC: RBC: 4.59 (ref 3.87–5.11)

## 2019-03-27 LAB — HM MAMMOGRAPHY

## 2019-06-03 ENCOUNTER — Ambulatory Visit: Payer: Medicare Other | Attending: Internal Medicine

## 2019-06-03 DIAGNOSIS — Z20822 Contact with and (suspected) exposure to covid-19: Secondary | ICD-10-CM

## 2019-06-05 LAB — NOVEL CORONAVIRUS, NAA: SARS-CoV-2, NAA: NOT DETECTED

## 2019-06-28 ENCOUNTER — Ambulatory Visit: Payer: Self-pay | Admitting: Internal Medicine

## 2019-07-05 ENCOUNTER — Ambulatory Visit (INDEPENDENT_AMBULATORY_CARE_PROVIDER_SITE_OTHER): Payer: Medicare Other | Admitting: Internal Medicine

## 2019-07-05 ENCOUNTER — Other Ambulatory Visit: Payer: Self-pay

## 2019-07-05 ENCOUNTER — Encounter: Payer: Self-pay | Admitting: Internal Medicine

## 2019-07-05 VITALS — BP 118/82 | HR 89 | Temp 97.3°F | Ht 62.0 in | Wt 174.0 lb

## 2019-07-05 DIAGNOSIS — M48062 Spinal stenosis, lumbar region with neurogenic claudication: Secondary | ICD-10-CM

## 2019-07-05 DIAGNOSIS — M51369 Other intervertebral disc degeneration, lumbar region without mention of lumbar back pain or lower extremity pain: Secondary | ICD-10-CM | POA: Insufficient documentation

## 2019-07-05 DIAGNOSIS — E039 Hypothyroidism, unspecified: Secondary | ICD-10-CM

## 2019-07-05 DIAGNOSIS — M5030 Other cervical disc degeneration, unspecified cervical region: Secondary | ICD-10-CM | POA: Insufficient documentation

## 2019-07-05 DIAGNOSIS — M5136 Other intervertebral disc degeneration, lumbar region: Secondary | ICD-10-CM | POA: Insufficient documentation

## 2019-07-05 DIAGNOSIS — M797 Fibromyalgia: Secondary | ICD-10-CM | POA: Insufficient documentation

## 2019-07-05 DIAGNOSIS — R739 Hyperglycemia, unspecified: Secondary | ICD-10-CM

## 2019-07-05 DIAGNOSIS — J3089 Other allergic rhinitis: Secondary | ICD-10-CM | POA: Diagnosis not present

## 2019-07-05 DIAGNOSIS — R002 Palpitations: Secondary | ICD-10-CM

## 2019-07-05 DIAGNOSIS — Z1159 Encounter for screening for other viral diseases: Secondary | ICD-10-CM

## 2019-07-05 DIAGNOSIS — Z13220 Encounter for screening for lipoid disorders: Secondary | ICD-10-CM

## 2019-07-05 DIAGNOSIS — I10 Essential (primary) hypertension: Secondary | ICD-10-CM | POA: Diagnosis not present

## 2019-07-05 DIAGNOSIS — M810 Age-related osteoporosis without current pathological fracture: Secondary | ICD-10-CM | POA: Insufficient documentation

## 2019-07-05 DIAGNOSIS — M4802 Spinal stenosis, cervical region: Secondary | ICD-10-CM

## 2019-07-05 DIAGNOSIS — J4520 Mild intermittent asthma, uncomplicated: Secondary | ICD-10-CM | POA: Diagnosis not present

## 2019-07-05 MED ORDER — ESTAZOLAM 2 MG PO TABS: 2.0000 mg | ORAL_TABLET | Freq: Every evening | ORAL | 3 refills | Status: DC | PRN

## 2019-07-05 MED ORDER — METOPROLOL SUCCINATE ER 50 MG PO TB24: 50.0000 mg | ORAL_TABLET | Freq: Every day | ORAL | 3 refills | Status: DC

## 2019-07-05 MED ORDER — ESTAZOLAM 2 MG PO TABS: 2.0000 mg | ORAL_TABLET | Freq: Every day | ORAL | 3 refills | Status: DC

## 2019-07-05 NOTE — Progress Notes (Signed)
Provider:  Rexene Edison. Mariea Clonts, D.O., C.M.D. Location:   [facility]   Place of Service:   clinic  Previous PCP: Kelton Pillar, MD Patient Care Team: Kelton Pillar, MD as PCP - General [facility])  Extended Emergency Contact Information Primary Emergency Contact: Hyun,Daniel Address: [address]          [place], [place] Phone: [phone] Mobile Phone: [phone] Relation: Spouse Secondary Emergency Contact: Sharion Settler, [place] Phone: [phone] Relation: Daughter  Code Status: DNR  Goals of Care: Advanced Directive information Advanced Directives 07/05/2019  Does Patient Have a Medical Advance Directive? Yes  Type of Advance Directive Living will  Does patient want to make changes to medical advance directive? No - Patient declined  Copy of Brady in Chart? -  Reviewed living will on file  Chief Complaint  Patient presents with  . Establish Care    New patient to establish care     HPI: Patient is a 74 y.o. female seen today to establish with [facility]  Records have been requested from Dr. Kelton Pillar at [place].    BP 133/72 when gave blood.  Previous office bp was high there.   BP had been running [id number] systolic so taking whole pill of the metoprolol 50mg  at night now.  Felt tired and rundown if she took it in the daytime even at a lower dose before.   She gets stressed dealing with her husband who has developed some dementia.  Fortunately, his PET was negative for recurrent prostate ca.    Gets gerd with too much water or tomato sauces when she goes to bed at night if she eats too late.  She had a swallow test.  Dr. Doristine Section her on pantoprazole.  She's actually have some come up in the past.  She's had less trouble the past few months.  Unfortunately loves lasagna.    Reports she worked at [employer] and has had prior vocal cord  damage.  She lost her voice altogether.    Allergic rhinitis:  Not using her nasal spray lately like she should.  Says uses in morning or when she lays down.  Worst with leaves in fall.  Takes otc allergy med at [facility].  Sinuses get dry so a little blood in her mucus.  Dr. Ernesto Rutherford talked about humidifier but she never pursued due to concerns about mold etc.  She says there's a lot of lint in the house.  She mentions maybe trying a vaporizer for the den.    Asthma:  Uses xopenex inhaler as needed.  She went to give platelets last valentine's day.  Before she finished, she thought she'd faint.  She asked for ginger ale.  She ate a snack.  The next day she was so sick, had a cough and chest felt full of mucus.  Went to Dr. Adrian Prince had two abx from him.  She could not breathe at all in the evening and would cough.  Then saw Dr. Delene Ruffini PA who gave her symbicort.  Did feel better and could cough up black stuff with that.  Covid antibodies were negative when tested in nov or dec.  She was too sick to take care of the grandbabies.  She felt so bad she thought she'd die and wrote her funeral arrangements.  She had to pant to breathe.  That's the sickest  she's ever been and it lasted three weeks.    Hypothyroidism:  It had just quit working.  She's been on levothyroxine since Dr. Minna Antis.  Has trouble losing weight despite exercising 1-1.5 hrs.  Does aerobics, running 30-40 mins.  Then takes her bath and lies down.    Has ADD:  Takes estazolam at bedtime to help her sleep.  It also helps her fibromyalgia.  Also uses flexeril--typically uses only when lies down.  May occasionally if she has a pain that radiates down her right thigh from her lower back.  Had a few back surgeries--had fractures that went into her spine--had one surgery, then another when it rotated.  2016, another surgery, 2017, had cervical spine surgery. Had DDD.  Feels like bone against bone in lower back again now.  Sees Dr. Sherwood Gambler once a year.     cscope 2014 with Dr. Lajuana Matte polyps.  He changed her from omeprazole to pantoprazole.  It works better anyway.  She had an EGD due to hiatal hernia.  She says they said they didn't see the hiatal hernia.    Had mammogram in 2017--did have in 2020--at [facility]--Dr. Isaiah Blakes said it was suspicious.  She had a biopsy.  Wound up having 4 mammos and ultrasounds.  Was to go back in Jan but didn't b/c busy with her husband.    Last bone density 2019--osteoporosis in pelvis.  She's had a hysterectomy.    She helps look after her grandbabies.  Sometimes her husband is nasty when he's upset.  They had some guy coming asking for money and her husband gave him some.  She says he opened the door at 1130pm for someone quoting scriptures, too.  She visits with her sister when she gets frustrated.  Sometimes prays.  She has gotten Quillian Quince to sign the power of attorney document.  He is buying stuff to do things but he can't remember how to fix things or doesn't ever get around to it and she has to pay them and then he gets mad.   Past Medical History:  Diagnosis Date  . Arthritis   . Asthma    " allergies"  . Chronic back pain   . Complication of anesthesia 1968   "fought the anesthesia" problems with memory [facility]  . Depression   . Fibromyalgia   . GERD (gastroesophageal reflux disease)   . History of hiatal hernia   . Hypertension   . Hypothyroidism   . PONV (postoperative nausea and vomiting)   . Thyroid disease   . Torn meniscus    left knee  . Wears glasses    Past Surgical History:  Procedure Laterality Date  . ABDOMINAL HYSTERECTOMY  1976  . ANTERIOR CERVICAL DECOMP/DISCECTOMY FUSION N/A 05/06/2015   Procedure: Cervical four-five, Cervical five-six, Cervical six-seven Anterior cervical decompression/diskectomy/fusion;  Surgeon: Leeroy Cha, MD;  Location: [facility];  Service: Neurosurgery;  Laterality: N/A;  C4-5 C5-6 C6-7 Anterior cervical  decompression/diskectomy/fusion  . APPENDECTOMY    . BREAST SURGERY Bilateral    biopsy- 2 different times  . CARPAL TUNNEL RELEASE     B/L  . CHOLECYSTECTOMY    . COLONOSCOPY  2014  . COLONOSCOPY W/ POLYPECTOMY    . dental implants    . DILATION AND CURETTAGE OF UTERUS  1968  . ELBOW ARTHROSCOPY Right    right  . FOOT GANGLION EXCISION Left    left  . KNEE ARTHROSCOPY Right    torn ligament  .  LAMINECTOMY     L 4 and 5; 2 separate surgeries  . left hand     carpal tunel  . right hand     3 surgeries- carpal tunel    Social History   Socioeconomic History  . Marital status: Married    Spouse name: Not on file  . Number of children: Not on file  . Years of education: Not on file  . Highest education level: Not on file  Occupational History  . Not on file  Tobacco Use  . Smoking status: Never Smoker  . Smokeless tobacco: Never Used  Substance and Sexual Activity  . Alcohol use: No  . Drug use: No  . Sexual activity: Not on file  Other Topics Concern  . Not on file  Social History Narrative   Social History      Diet? none      Do you drink/eat things with caffeine? No       Marital status?    married                                What year were you married? 1961      Do you live in a house, apartment, assisted living, condo, trailer, etc.? home      Is it one or more stories? 1 level       How many persons live in your home? 2       Do you have any pets in your home? (please list) none      Highest level of education completed? Completed college 4 years      Current or past profession: social work activist      Do you exercise?        yes                              Type & how often? Aerobic 4 to 5 times a weeks      Advanced Directives      Do you have a living will? yes      Do you have a DNR form?                                  If not, do you want to discuss one?no       Do you have signed POA/HPOA for forms? yes      Functional Status       Do you have difficulty bathing or dressing yourself? no      Do you have difficulty preparing food or eating? No       Do you have difficulty managing your medications? No       Do you have difficulty managing your finances? No       Do you have difficulty affording your medications? No       Social Determinants of Health   Financial Resource Strain:   . Difficulty of Paying Living Expenses: Not on file  Food Insecurity:   . Worried About Charity fundraiser in the Last Year: Not on file  . Ran Out of Food in the Last Year: Not on file  Transportation Needs:   . Lack of Transportation (Medical): Not on file  . Lack of Transportation (Non-Medical): Not on file  Physical Activity:   .  Days of Exercise per Week: Not on file  . Minutes of Exercise per Session: Not on file  Stress:   . Feeling of Stress : Not on file  Social Connections:   . Frequency of Communication with Friends and Family: Not on file  . Frequency of Social Gatherings with Friends and Family: Not on file  . Attends Religious Services: Not on file  . Active Member of Clubs or Organizations: Not on file  . Attends Archivist Meetings: Not on file  . Marital Status: Not on file    reports that she has never smoked. She has never used smokeless tobacco. She reports that she does not drink alcohol or use drugs.  Functional Status Survey:  independent  Family History  Problem Relation Age of Onset  . Hypertension Mother   . Lupus Mother   . Heart attack Mother   . Cancer Mother   . Stroke Mother   . Hypertension Father   . Heart disease Father   . Lupus Father   . Heart attack Father   . Heart attack Brother   . Leukemia Brother   . HIV/AIDS Sister   . Leukemia Brother   . Cancer Sister        intestinal  . Lupus Sister   . Breast cancer Sister   . Diabetes Daughter   . Diabetes Daughter   . Fibromyalgia Daughter   . Colon cancer Neg Hx     Health Maintenance  Topic Date Due   . Hepatitis C Screening  03-06-1946  . DEXA SCAN  08/13/2008  . COLONOSCOPY  08/25/2019  . TETANUS/TDAP  12/10/2020  . INFLUENZA VACCINE  Completed  . PNA vac Low Risk Adult  Completed    Allergies  Allergen Reactions  . Beta Adrenergic Blockers Other (See Comments)    Certain beta blockers make me hoarse.  . Mobic [Meloxicam] Hypertension    Made blood pressure extremely high  . Sulfa Antibiotics Itching and Swelling  . Tape Rash    Paper tape  . Aspirin Other (See Comments)    Stomach burning; increased acid  . Oxycodone Other (See Comments)    It makes her someone she's not     Outpatient Encounter Medications as of 07/05/2019  Medication Sig  . Cholecalciferol (D3 SUPER STRENGTH) 2000 UNITS CAPS Take 2,000 Units by mouth daily.   . cyanocobalamin 2000 MCG tablet Take 2,000 mcg by mouth daily.  . cyclobenzaprine (FLEXERIL) 10 MG tablet Take 10 mg by mouth daily.   Marland Kitchen levalbuterol (XOPENEX HFA) 45 MCG/ACT inhaler Inhale 2 puffs into the lungs every 6 (six) hours as needed for wheezing or shortness of breath.   . levothyroxine (SYNTHROID, LEVOTHROID) 50 MCG tablet Take 50 mcg by mouth every morning.   Marland Kitchen losartan (COZAAR) 50 MG tablet Take 50 mg by mouth daily.  . mometasone (NASONEX) 50 MCG/ACT nasal spray Place 2 sprays into the nose daily as needed (allergies).  . pantoprazole (PROTONIX) 40 MG tablet Take 40 mg by mouth daily.  Marland Kitchen estazolam (PROSOM) 2 MG tablet Take 1 tablet (2 mg total) by mouth at bedtime as needed (muscle pain and insomnia).  . metoprolol succinate (TOPROL-XL) 50 MG 24 hr tablet Take 1 tablet (50 mg total) by mouth at bedtime. Take with or immediately following a meal.  . [DISCONTINUED] estazolam (PROSOM) 2 MG tablet Take 1 tablet (2 mg total) by mouth at bedtime.   No facility-administered encounter medications on file as of 07/05/2019.  Review of Systems  Constitutional: Negative for chills, fever and malaise/fatigue.  HENT: Positive for congestion  and hearing loss. Negative for sore throat.        Left TM punctured; hoarseness and prior vocal cord damage; dry mouth; hearing loss right ear  Eyes: Negative for blurred vision.       New glasses; has right cataract developing but not ready for surgery yet  Respiratory: Negative for cough, shortness of breath and wheezing.   Cardiovascular: Positive for palpitations. Negative for chest pain and leg swelling.  Gastrointestinal: Negative for abdominal pain, blood in stool, constipation, diarrhea and melena.  Genitourinary: Negative for dysuria, frequency and urgency.  Musculoskeletal: Positive for back pain, joint pain and myalgias. Negative for falls and neck pain.  Skin: Negative for itching and rash.  Neurological: Negative for dizziness, tingling, sensory change and loss of consciousness.  Endo/Heme/Allergies: Positive for environmental allergies. Does not bruise/bleed easily.  Psychiatric/Behavioral: Negative for depression and memory loss. The patient is nervous/anxious and has insomnia.     Vitals:   07/05/19 1011  BP: 118/82  Pulse: 89  Temp: (!) 97.3 F (36.3 C)  TempSrc: Temporal  SpO2: 98%  Weight: 174 lb (78.9 kg)  Height: 5\' 2"  (1.575 m)   Body mass index is 31.83 kg/m. Physical Exam Vitals reviewed.  Constitutional:      General: She is not in acute distress.    Appearance: Normal appearance. She is not ill-appearing or toxic-appearing.  HENT:     Head: Normocephalic and atraumatic.     Right Ear: Tympanic membrane, ear canal and external ear normal.     Left Ear: Ear canal and external ear normal.     Ears:     Comments: Left tm with hole    Nose:     Comments: Nose and mouth deferred due to covid masking and no reported complaints Eyes:     Extraocular Movements: Extraocular movements intact.     Conjunctiva/sclera: Conjunctivae normal.     Pupils: Pupils are equal, round, and reactive to light.     Comments: glasses  Cardiovascular:     Rate and Rhythm:  Normal rate and regular rhythm.     Pulses: Normal pulses.     Heart sounds: Normal heart sounds.  Pulmonary:     Effort: Pulmonary effort is normal.     Breath sounds: Normal breath sounds. No wheezing.  Abdominal:     General: Bowel sounds are normal. There is no distension.     Palpations: Abdomen is soft. There is no mass.     Tenderness: There is no abdominal tenderness. There is no guarding or rebound.  Musculoskeletal:        General: Normal range of motion.     Cervical back: Neck supple. No rigidity.     Right lower leg: No edema.     Left lower leg: No edema.  Lymphadenopathy:     Cervical: No cervical adenopathy.  Skin:    General: Skin is warm and dry.     Capillary Refill: Capillary refill takes less than 2 seconds.  Neurological:     General: No focal deficit present.     Mental Status: She is alert and oriented to person, place, and time.     Cranial Nerves: No cranial nerve deficit.     Sensory: No sensory deficit.     Motor: No weakness.     Coordination: Coordination normal.     Gait: Gait normal.  Deep Tendon Reflexes: Reflexes normal.  Psychiatric:        Mood and Affect: Mood normal.     Comments: Tears up talking about her husband's decline and changes over the past year     Labs reviewed: Need records for labs--pt not sure when last checked--looks like she may have been seen last in June of 2019 based on AVS she brought form [facility]  Imaging and Procedures noted on new patient packet: Colonoscopy Dr. Michail Sermon 2019 Bone density Dr. Minna Antis 2015 MRI/CT Dr. Sherwood Gambler 2015 Mammogram 2020 [facility]  Assessment/Plan 1. Essential hypertension - at goal today, cont beta blocker and ARB -sounds like hoarseness was actually not from her med - metoprolol succinate (TOPROL-XL) 50 MG 24 hr tablet; Take 1 tablet (50 mg total) by mouth at bedtime. Take with or immediately following a meal.  Dispense: 90 tablet; Refill: 3 - Basic metabolic panel - EKG [id number]  2.  Environmental and seasonal allergies -continues on nasonex, also has GERD on pantoprazole so all of these contribute to her hoarseness  3. Mild intermittent asthma without complication -continues on prn xopenex; had symbicort when she had an acute bronchitis last winter, but generally does not need anything unless ill  4. Acquired hypothyroidism - continues on levothyroxine 37mcg daily when first gets up - f/u TSH  5. Fibromyalgia - has taken anxiolytic intermittently to help with muscle spasms and found it also helped rest -discussed risks of benzos in older adults but she wants to continue and does not use nightly - estazolam (PROSOM) 2 MG tablet; Take 1 tablet (2 mg total) by mouth at bedtime as needed (muscle pain and insomnia).  Dispense: 90 tablet; Refill: 3  6. Cervical stenosis of spinal canal -prior neck surgery, no active complaints  7. DDD (degenerative disc disease), cervical -with prior surgery, no active neck complaints  8. Lumbar stenosis with neurogenic claudication -prior surgery, continue to monitor for new symptoms, no red flags  9. DDD (degenerative disc disease), lumbar -some discomfort coming and going in right lower back to right thigh at times -seems Dr. Sherwood Gambler annually  10. Age-related osteoporosis without current pathological fracture -says her last bone density showed some OP in pelvis, done at [facility]--need report  11. Palpitations - EKG done today with NSR at 64bpm - CBC with Differential/Platelet  12. Encounter for hepatitis C screening test for low risk patient - Hepatitis C antibody  13. Lipid screening - unclear if had hyperlipdemia before, f/u lab - Hepatic function panel - Lipid panel  14. Hyperglycemia - f/u labs - Hepatic function panel - Hemoglobin A1c    Labs/tests ordered:    Lab Orders     CBC with Differential/Platelet     Basic metabolic panel     Hepatic function panel     Hemoglobin A1c     TSH     Lipid panel      Hepatitis C antibody  4 mos med mgt  Ashwika Freels L. Zacchaeus Halm, D.O. [facility] [address], [place] [address] Cell Phone (Mon-Fri 8am-5pm):  [phone] On Call:  [phone] & follow prompts after 5pm & weekends Office Phone:  [phone] Office Fax:  [phone]

## 2019-07-07 ENCOUNTER — Other Ambulatory Visit: Payer: Self-pay | Admitting: Adult Health

## 2019-07-07 ENCOUNTER — Telehealth: Payer: Self-pay | Admitting: Adult Health

## 2019-07-07 MED ORDER — VALACYCLOVIR HCL 1 G PO TABS: 1000.0000 mg | ORAL_TABLET | Freq: Three times a day (TID) | ORAL | 0 refills | Status: AC

## 2019-07-07 NOTE — Progress Notes (Signed)
Valacyclovir 

## 2019-07-08 LAB — CBC WITH DIFFERENTIAL/PLATELET
Absolute Monocytes: 396 cells/uL (ref 200–950)
Basophils Absolute: 39 cells/uL (ref 0–200)
Basophils Relative: 0.7 %
Eosinophils Absolute: 110 cells/uL (ref 15–500)
Eosinophils Relative: 2 %
HCT: 35 % (ref 35.0–45.0)
Hemoglobin: 11.1 g/dL — ABNORMAL LOW (ref 11.7–15.5)
Lymphs Abs: 2123 cells/uL (ref 850–3900)
MCH: 24.6 pg — ABNORMAL LOW (ref 27.0–33.0)
MCHC: 31.7 g/dL — ABNORMAL LOW (ref 32.0–36.0)
MCV: 77.6 fL — ABNORMAL LOW (ref 80.0–100.0)
MPV: 10.2 fL (ref 7.5–12.5)
Monocytes Relative: 7.2 %
Neutro Abs: 2833 cells/uL (ref 1500–7800)
Neutrophils Relative %: 51.5 %
Platelets: 351 10*3/uL (ref 140–400)
RBC: 4.51 10*6/uL (ref 3.80–5.10)
RDW: 14.6 % (ref 11.0–15.0)
Total Lymphocyte: 38.6 %
WBC: 5.5 10*3/uL (ref 3.8–10.8)

## 2019-07-08 LAB — HEMOGLOBIN A1C
Hgb A1c MFr Bld: 6.1 % of total Hgb — ABNORMAL HIGH (ref <5.7)
Mean Plasma Glucose: 128 (calc)
eAG (mmol/L): 7.1 (calc)

## 2019-07-08 LAB — HEPATIC FUNCTION PANEL
AG Ratio: 1.5 (calc) (ref 1.0–2.5)
ALT: 20 U/L (ref 6–29)
AST: 22 U/L (ref 10–35)
Albumin: 4.1 g/dL (ref 3.6–5.1)
Alkaline phosphatase (APISO): 104 U/L (ref 37–153)
Bilirubin, Direct: 0.1 mg/dL (ref 0.0–0.2)
Globulin: 2.7 g/dL (calc) (ref 1.9–3.7)
Indirect Bilirubin: 0.3 mg/dL (calc) (ref 0.2–1.2)
Total Bilirubin: 0.4 mg/dL (ref 0.2–1.2)
Total Protein: 6.8 g/dL (ref 6.1–8.1)

## 2019-07-08 LAB — BASIC METABOLIC PANEL
BUN: 14 mg/dL (ref 7–25)
CO2: 27 mmol/L (ref 20–32)
Calcium: 9.9 mg/dL (ref 8.6–10.4)
Chloride: 105 mmol/L (ref 98–110)
Creat: 0.9 mg/dL (ref 0.60–0.93)
Glucose, Bld: 96 mg/dL (ref 65–99)
Potassium: 4.4 mmol/L (ref 3.5–5.3)
Sodium: 139 mmol/L (ref 135–146)

## 2019-07-08 LAB — LIPID PANEL
Cholesterol: 173 mg/dL (ref <200)
HDL: 71 mg/dL (ref >=50)
LDL Cholesterol (Calc): 83 mg/dL (calc)
Non-HDL Cholesterol (Calc): 102 mg/dL (calc) (ref <130)
Total CHOL/HDL Ratio: 2.4 (calc) (ref <5.0)
Triglycerides: 101 mg/dL (ref <150)

## 2019-07-08 LAB — TSH: TSH: 1.46 mIU/L (ref 0.40–4.50)

## 2019-07-08 LAB — HEPATITIS C ANTIBODY
Hepatitis C Ab: NONREACTIVE
SIGNAL TO CUT-OFF: 0.01 (ref <1.00)

## 2019-07-08 NOTE — Progress Notes (Signed)
She has mild anemia.  Looking back, her blood count has gradually been trending down over the past several years, but she never was truly anemic until now.  Looks like iron deficiency.  Please find out if she's had any blood in her stools or dark appearing stools? Electrolytes, liver and kidneys all look ok. Sugar average is in prediabetic range at 6.1.  This means she needs to watch her sweets and starchy carbs more (pasta, rice, bread, potatoes, fried items, desserts).   Thyroid is normal. Cholesterol is satisfactory since she does not have any history of heart disease or true diabetes.  LDL goal <100 and she's at 83.   Hepatitis C screen is negative.

## 2019-07-08 NOTE — Progress Notes (Signed)
Spoke with [name] she stated that she has been giving blood over 9 gallons in the last 40 years her most recent was 04/25/2020. Since her visit on Friday 07/05/19 she called and sent pictures to another Dr. She has shingles and was prescribed Balacyclovir HDL 1 mg 3 x a day and she was contagious . She does not eat much meat, however she loves sweet and will cut back. She also stated she will cut back on the potatoes that she  eat mostly when she has her grand kids

## 2019-07-11 ENCOUNTER — Other Ambulatory Visit: Payer: Self-pay | Admitting: *Deleted

## 2019-07-11 DIAGNOSIS — I10 Essential (primary) hypertension: Secondary | ICD-10-CM

## 2019-07-11 MED ORDER — CYCLOBENZAPRINE HCL 10 MG PO TABS: ORAL_TABLET | ORAL | 1 refills | Status: DC

## 2019-07-11 MED ORDER — PANTOPRAZOLE SODIUM 40 MG PO TBEC: 40.0000 mg | DELAYED_RELEASE_TABLET | Freq: Every day | ORAL | 1 refills | Status: DC

## 2019-07-11 MED ORDER — LEVOTHYROXINE SODIUM 50 MCG PO TABS: 50.0000 ug | ORAL_TABLET | Freq: Every morning | ORAL | 1 refills | Status: DC

## 2019-07-11 MED ORDER — METOPROLOL SUCCINATE ER 50 MG PO TB24: 50.0000 mg | ORAL_TABLET | Freq: Every day | ORAL | 1 refills | Status: DC

## 2019-07-11 NOTE — Telephone Encounter (Signed)
Patient called for refills. Pended and sent to Dr. Mariea Clonts for approval.   Patient stated that she needs Losartan 100mg  Take one tablet by mouth once daily. (not in current medication list. Patient states she is taking the 100mg  not the 50mg .) Is this ok to add and refill. Please Advise.

## 2019-07-12 ENCOUNTER — Other Ambulatory Visit: Payer: Self-pay | Admitting: *Deleted

## 2019-07-12 DIAGNOSIS — M797 Fibromyalgia: Secondary | ICD-10-CM

## 2019-07-12 MED ORDER — ESTAZOLAM 2 MG PO TABS: 2.0000 mg | ORAL_TABLET | Freq: Every evening | ORAL | 1 refills | Status: DC | PRN

## 2019-07-12 NOTE — Telephone Encounter (Signed)
Received refill Request from [facility] for refill on Estazolam.   Ashley Verified and LR was 06/14/19 For 90 day supply through [facility]  Refill Not due at this time.

## 2019-07-18 ENCOUNTER — Other Ambulatory Visit: Payer: Self-pay | Admitting: *Deleted

## 2019-07-18 DIAGNOSIS — M797 Fibromyalgia: Secondary | ICD-10-CM

## 2019-07-18 MED ORDER — ESTAZOLAM 2 MG PO TABS: 2.0000 mg | ORAL_TABLET | Freq: Every evening | ORAL | 1 refills | Status: DC | PRN

## 2019-07-18 NOTE — Telephone Encounter (Signed)
This looks like it was originally sent on 07/12/2019 but it was printed, this is controlled and has to be sent by Dr. Mariea Clonts.

## 2019-07-18 NOTE — Telephone Encounter (Signed)
Patient called requesting refill to be sent to [facility]. Stated that she cannot afford to get medication through mail Order $90.  Pended Rx and sent to Dr. Mariea Clonts for approval.

## 2019-07-19 ENCOUNTER — Other Ambulatory Visit: Payer: Self-pay | Admitting: *Deleted

## 2019-07-19 DIAGNOSIS — M797 Fibromyalgia: Secondary | ICD-10-CM

## 2019-07-19 MED ORDER — ESTAZOLAM 2 MG PO TABS: 2.0000 mg | ORAL_TABLET | Freq: Every evening | ORAL | 1 refills | Status: DC | PRN

## 2019-07-19 NOTE — Telephone Encounter (Signed)
Patient called and stated that the Estazolam was too expensive at [facility] $95. Stated that she can get it at [facility] for $29.00 and wants the Rx sent there instead.  Pended Rx and sent to [facility] for approval due to Dr. Mariea Clonts out of office.

## 2019-07-24 ENCOUNTER — Other Ambulatory Visit: Payer: Self-pay

## 2019-07-24 DIAGNOSIS — G5602 Carpal tunnel syndrome, left upper limb: Secondary | ICD-10-CM | POA: Diagnosis not present

## 2019-07-24 DIAGNOSIS — M79642 Pain in left hand: Secondary | ICD-10-CM | POA: Diagnosis not present

## 2019-07-24 DIAGNOSIS — M65341 Trigger finger, right ring finger: Secondary | ICD-10-CM | POA: Diagnosis not present

## 2019-07-24 DIAGNOSIS — M65332 Trigger finger, left middle finger: Secondary | ICD-10-CM | POA: Diagnosis not present

## 2019-07-24 MED ORDER — VALACYCLOVIR HCL 1 G PO TABS: 1000.0000 mg | ORAL_TABLET | Freq: Three times a day (TID) | ORAL | 0 refills | Status: DC

## 2019-07-24 NOTE — Telephone Encounter (Signed)
Victoria Hudson had prescribed the patient Valtrex for blisters on her lips.  Evidently she was on call at this time.  Patient states she completed the medication approximately one week ago and is having severe itching and pain across her shoulders.  She has another MD appointment today and we are unable to get her seen today with the current schedule and an emergency.   I pended a refill in the event that this would be appropriate.

## 2019-07-24 NOTE — Telephone Encounter (Signed)
This encounter was created in error - please disregard.

## 2019-08-02 ENCOUNTER — Telehealth: Payer: Self-pay | Admitting: *Deleted

## 2019-08-02 NOTE — Telephone Encounter (Signed)
Patient daughter called and left message on Clinical intake voicemail and stated that patient is complaining of red spots popping up all over since January and no better.   I tried calling patient back and had to leave message on Voicemail to return call. To offer an appointment to come in to evaluate.

## 2019-08-05 ENCOUNTER — Encounter: Payer: Self-pay | Admitting: Internal Medicine

## 2019-08-05 ENCOUNTER — Ambulatory Visit (INDEPENDENT_AMBULATORY_CARE_PROVIDER_SITE_OTHER): Payer: Medicare HMO | Admitting: Internal Medicine

## 2019-08-05 ENCOUNTER — Other Ambulatory Visit: Payer: Self-pay

## 2019-08-05 VITALS — BP 140/82 | HR 72 | Temp 97.5°F | Wt 172.1 lb

## 2019-08-05 DIAGNOSIS — B029 Zoster without complications: Secondary | ICD-10-CM | POA: Diagnosis not present

## 2019-08-05 DIAGNOSIS — Z636 Dependent relative needing care at home: Secondary | ICD-10-CM

## 2019-08-05 NOTE — Telephone Encounter (Signed)
LMOM to return call.

## 2019-08-05 NOTE — Patient Instructions (Signed)
[employer]--go to their website.  They have information and programs you can watch online for caregiver education about Victoria Hudson's dementia.

## 2019-08-05 NOTE — Progress Notes (Signed)
Location:  [facility] clinic Provider: Kieffer Blatz L. Mariea Clonts, D.O., C.M.D.  Goals of Care:  Advanced Directives 08/05/2019  Does Patient Have a Medical Advance Directive? Yes  Type of Advance Directive Living will  Does patient want to make changes to medical advance directive? No - Patient declined  Copy of [place] in Chart? -     Chief Complaint  Patient presents with  . Acute Visit    rash/ shingles     HPI: Patient is a 74 y.o. female seen today for an acute visit for rash  Had shingles before and BP was high then.  Also husband in ED to r/o stroke due to severe hypertension.    She's had just a couple of bumps on her right hand and her back.  They itch and burn.  She's taken two rounds of valtrex for 10 days a piece.  She's been using ibuprofen which has been sufficient--reminded her to take with food.  She has a zostavax way back, but has not had shingrix series.    She's continued a full metoprolol herself for her bp.  BP did improve from [id number] to 0000000 systolic over visit.  Past Medical History:  Diagnosis Date  . Arthritis   . Asthma    " allergies"  . Chronic back pain   . Complication of anesthesia 1968   "fought the anesthesia" problems with memory 05/06/15 at [facility]"  . Depression   . Fibromyalgia   . GERD (gastroesophageal reflux disease)   . History of hiatal hernia   . Hypertension   . Hypothyroidism   . PONV (postoperative nausea and vomiting)   . Thyroid disease   . Torn meniscus    left knee  . Wears glasses     Past Surgical History:  Procedure Laterality Date  . ABDOMINAL HYSTERECTOMY  1976  . ANTERIOR CERVICAL DECOMP/DISCECTOMY FUSION N/A 05/06/2015   Procedure: Cervical four-five, Cervical five-six, Cervical six-seven Anterior cervical decompression/diskectomy/fusion;  Surgeon: Leeroy Cha, MD;  Location: [facility];  Service: Neurosurgery;  Laterality: N/A;  C4-5 C5-6 C6-7 Anterior cervical decompression/diskectomy/fusion  .  APPENDECTOMY    . BREAST SURGERY Bilateral    biopsy- 2 different times  . CARPAL TUNNEL RELEASE     B/L  . CHOLECYSTECTOMY    . COLONOSCOPY  2014  . COLONOSCOPY W/ POLYPECTOMY    . dental implants    . DILATION AND CURETTAGE OF UTERUS  1968  . ELBOW ARTHROSCOPY Right    right  . FOOT GANGLION EXCISION Left    left  . KNEE ARTHROSCOPY Right    torn ligament  . LAMINECTOMY     L 4 and 5; 2 separate surgeries  . left hand     carpal tunel  . right hand     3 surgeries- carpal tunel    Allergies  Allergen Reactions  . Beta Adrenergic Blockers Other (See Comments)    Certain beta blockers make me hoarse.  . Mobic [Meloxicam] Hypertension    Made blood pressure extremely high  . Sulfa Antibiotics Itching and Swelling  . Tape Rash    Paper tape  . Aspirin Other (See Comments)    Stomach burning; increased acid  . Oxycodone Other (See Comments)    It makes her someone she's not     Outpatient Encounter Medications as of 08/05/2019  Medication Sig  . Cholecalciferol (D3 SUPER STRENGTH) 2000 UNITS CAPS Take 2,000 Units by mouth daily.   . cyanocobalamin  2000 MCG tablet Take 2,000 mcg by mouth daily.  . cyclobenzaprine (FLEXERIL) 10 MG tablet Take 1/2 -1 tablet by mouth up to twice daily as needed  . estazolam (PROSOM) 2 MG tablet Take 1 tablet (2 mg total) by mouth at bedtime as needed (muscle pain and insomnia).  Marland Kitchen levalbuterol (XOPENEX HFA) 45 MCG/ACT inhaler Inhale 2 puffs into the lungs every 6 (six) hours as needed for wheezing or shortness of breath.   . levothyroxine (SYNTHROID) 50 MCG tablet Take 1 tablet (50 mcg total) by mouth every morning.  Marland Kitchen losartan (COZAAR) 50 MG tablet Take 50 mg by mouth daily.  . metoprolol succinate (TOPROL-XL) 50 MG 24 hr tablet Take 1 tablet (50 mg total) by mouth at bedtime. Take with or immediately following a meal.  . mometasone (NASONEX) 50 MCG/ACT nasal spray Place 2 sprays into the nose daily as needed (allergies).  . pantoprazole  (PROTONIX) 40 MG tablet Take 1 tablet (40 mg total) by mouth daily.  . valACYclovir (VALTREX) 1000 MG tablet Take 1 tablet (1,000 mg total) by mouth 3 (three) times daily.   No facility-administered encounter medications on file as of 08/05/2019.    Review of Systems:  Review of Systems  Constitutional: Negative for chills and fever.  Cardiovascular: Negative for chest pain.  Skin: Positive for itching and rash.  Psychiatric/Behavioral: The patient is nervous/anxious.     Health Maintenance  Topic Date Due  . DEXA SCAN  08/13/2008  . COLONOSCOPY  08/25/2019  . TETANUS/TDAP  12/10/2020  . INFLUENZA VACCINE  Completed  . Hepatitis C Screening  Completed  . PNA vac Low Risk Adult  Completed    Physical Exam: Vitals:   08/05/19 1355 08/05/19 1555  BP: (!) 150/92 140/82  Pulse: 72   Temp: (!) 97.5 F (36.4 C)   TempSrc: Temporal   SpO2: 96%   Weight: 172 lb 1.6 oz (78.1 kg)    Body mass index is 31.48 kg/m. Physical Exam Vitals reviewed.  Constitutional:      Appearance: Normal appearance.  Skin:    Findings: Rash present.     Comments: Large papules in a cluster over C8/T1 dermatome region on right upper back and some on right wrist--no open pustules  Neurological:     General: No focal deficit present.     Mental Status: She is alert. Mental status is at baseline.  Psychiatric:        Mood and Affect: Mood normal.     Comments: Stressed about husband in ED with high blood pressure--she's worried he's going to have another stroke b/c he's not taking his bp meds as I had recommended (still taking old pills from last doctor)    Labs reviewed: Basic Metabolic Panel: Recent Labs    07/05/19 1206  NA 139  K 4.4  CL 105  CO2 27  GLUCOSE 96  BUN 14  CREATININE 0.90  CALCIUM 9.9  TSH 1.46   Liver Function Tests: Recent Labs    07/05/19 1206  AST 22  ALT 20  BILITOT 0.4  PROT 6.8   No results for input(s): LIPASE, AMYLASE in the last 8760 hours. No  results for input(s): AMMONIA in the last 8760 hours. CBC: Recent Labs    07/05/19 1206  WBC 5.5  NEUTROABS 2,833  HGB 11.1*  HCT 35.0  MCV 77.6*  PLT 351   Lipid Panel: Recent Labs    07/05/19 1206  CHOL 173  HDL 71  LDLCALC 83  TRIG 101  CHOLHDL 2.4   Lab Results  Component Value Date   HGBA1C 6.1 (H) 07/05/2019    Assessment/Plan 1. Herpes zoster infection of thoracic region -lower cervical/upper thoracic on back and a little on right wrist -already had two courses of valacyclovir and not that bothered by the itching or pain -using ibuprofen some with food  2. Caregiver stress -given info about [facility] [facility] to watch videos and attend virtual sessions to learn to cope with some of her husband's behaviors -also given coach's guide for dementia  30 mins spent on counseling especially re: #2  Labs/tests ordered:  No new today Next appt:  10/10/2019   Antwoin Lackey L. Talyn Dessert, D.O. [facility] [address], [place] [address] Cell Phone (Mon-Fri 8am-5pm):  [phone] On Call:  [phone] & follow prompts after 5pm & weekends Office Phone:  [phone] Office Fax:  [phone]

## 2019-08-05 NOTE — Telephone Encounter (Signed)
Patient scheduled an appointment with Dr. Mariea Clonts for today to have evaluated.

## 2019-08-20 ENCOUNTER — Other Ambulatory Visit: Payer: Self-pay

## 2019-08-20 ENCOUNTER — Encounter: Payer: Self-pay | Admitting: Family

## 2019-08-20 ENCOUNTER — Ambulatory Visit (INDEPENDENT_AMBULATORY_CARE_PROVIDER_SITE_OTHER): Payer: Medicare HMO | Admitting: Family

## 2019-08-20 VITALS — BP 150/92 | HR 97 | Temp 97.3°F | Resp 18 | Ht 62.0 in | Wt 173.0 lb

## 2019-08-20 DIAGNOSIS — G8929 Other chronic pain: Secondary | ICD-10-CM | POA: Diagnosis not present

## 2019-08-20 DIAGNOSIS — M542 Cervicalgia: Secondary | ICD-10-CM

## 2019-08-20 DIAGNOSIS — I10 Essential (primary) hypertension: Secondary | ICD-10-CM

## 2019-08-20 DIAGNOSIS — L299 Pruritus, unspecified: Secondary | ICD-10-CM | POA: Diagnosis not present

## 2019-08-20 DIAGNOSIS — R21 Rash and other nonspecific skin eruption: Secondary | ICD-10-CM | POA: Diagnosis not present

## 2019-08-20 MED ORDER — HYDROXYZINE HCL 10 MG PO TABS: 10.0000 mg | ORAL_TABLET | Freq: Three times a day (TID) | ORAL | 0 refills | Status: DC | PRN

## 2019-08-20 MED ORDER — LOSARTAN POTASSIUM 50 MG PO TABS: 75.0000 mg | ORAL_TABLET | Freq: Every day | ORAL | 1 refills | Status: DC

## 2019-08-20 MED ORDER — HYDROCORTISONE 0.5 % EX CREA: 1.0000 "application " | TOPICAL_CREAM | Freq: Two times a day (BID) | CUTANEOUS | 0 refills | Status: DC

## 2019-08-20 NOTE — Progress Notes (Signed)
Provider: Sharnay Cashion FNP-C  Gayland Curry, DO  Patient Care Team: Gayland Curry, DO as PCP - General (Geriatric Medicine) Mammography, Teola Bradley (Diagnostic Radiology) Jovita Gamma, MD as Consulting Physician (Neurosurgery) Wilford Corner, MD as Consulting Physician (Gastroenterology) Allyn Kenner, MD (Dermatology) Thornell Sartorius, MD (Otolaryngology)  Extended Emergency Contact Information Primary Emergency Contact: Oliveira,Daniel Address: [place], [place] Phone: [phone] Mobile Phone: [phone] Relation: Spouse Secondary Emergency Contact: Sharion Settler,  [place] Phone: [phone] Relation: Daughter  Code Status:  DNR Goals of care: Advanced Directive information Advanced Directives 08/20/2019  Does Patient Have a Medical Advance Directive? Yes  Type of Advance Directive Living will  Does patient want to make changes to medical advance directive? No - Patient declined  Copy of [place] in Chart? -     Chief Complaint  Patient presents with  . Acute Visit     Shingle outbreak     HPI:  Pt is a 74 y.o. female seen today for an acute visit for evaluation of rash.she states had shingles in January which was treated with valtrex x 10 days.but did not resolved so was treated with another round of valtrex by Dr.Reed .she states shingles not resolved.states yesterday had new bumps on her left forearm and right side of her neck that are red and itchy.previous shingles were on the right shoulder and upper back.right shoulder rash have resolved.she denies any blister or pain states just itching.Also denies any fever or chills.  Also states having left side neck pain.Has had on going pain and previous surgery to the neck.she request referral to Dr.[facility] with [facility] that she has seen in the past. Her blood  pressure on arrival 152/92 rechecked SBP 162    Past Medical History:  Diagnosis Date  . Arthritis   . Asthma    " allergies"  . Chronic back pain   . Complication of anesthesia 1968   "fought the anesthesia" problems with memory 05/06/15 at [facility]"  . Depression   . Fibromyalgia   . GERD (gastroesophageal reflux disease)   . History of hiatal hernia   . Hypertension   . Hypothyroidism   . PONV (postoperative nausea and vomiting)   . Thyroid disease   . Torn meniscus    left knee  . Wears glasses    Past Surgical History:  Procedure Laterality Date  . ABDOMINAL HYSTERECTOMY  1976  . ANTERIOR CERVICAL DECOMP/DISCECTOMY FUSION N/A 05/06/2015   Procedure: Cervical four-five, Cervical five-six, Cervical six-seven Anterior cervical decompression/diskectomy/fusion;  Surgeon: Leeroy Cha, MD;  Location: [facility];  Service: Neurosurgery;  Laterality: N/A;  C4-5 C5-6 C6-7 Anterior cervical decompression/diskectomy/fusion  . APPENDECTOMY    . BREAST SURGERY Bilateral    biopsy- 2 different times  . CARPAL TUNNEL RELEASE     B/L  . CHOLECYSTECTOMY    . COLONOSCOPY  2014  . COLONOSCOPY W/ POLYPECTOMY    . dental implants    . DILATION AND CURETTAGE OF UTERUS  1968  . ELBOW ARTHROSCOPY Right    right  . FOOT GANGLION EXCISION Left    left  . KNEE ARTHROSCOPY Right    torn ligament  . LAMINECTOMY     L 4 and 5; 2 separate surgeries  . left hand     carpal tunel  .  right hand     3 surgeries- carpal tunel    Allergies  Allergen Reactions  . Beta Adrenergic Blockers Other (See Comments)    Certain beta blockers make me hoarse.  . Mobic [Meloxicam] Hypertension    Made blood pressure extremely high  . Sulfa Antibiotics Itching and Swelling  . Tape Rash    Paper tape  . Aspirin Other (See Comments)    Stomach burning; increased acid  . Oxycodone Other (See Comments)    It makes her someone she's not     Outpatient Encounter Medications as of 08/20/2019   Medication Sig  . Cholecalciferol (D3 SUPER STRENGTH) 2000 UNITS CAPS Take 2,000 Units by mouth daily.   . cyanocobalamin 2000 MCG tablet Take 2,000 mcg by mouth daily.  . cyclobenzaprine (FLEXERIL) 10 MG tablet Take 1/2 -1 tablet by mouth up to twice daily as needed  . estazolam (PROSOM) 2 MG tablet Take 1 tablet (2 mg total) by mouth at bedtime as needed (muscle pain and insomnia).  Marland Kitchen levalbuterol (XOPENEX HFA) 45 MCG/ACT inhaler Inhale 2 puffs into the lungs every 6 (six) hours as needed for wheezing or shortness of breath.   . levothyroxine (SYNTHROID) 50 MCG tablet Take 1 tablet (50 mcg total) by mouth every morning.  Marland Kitchen losartan (COZAAR) 50 MG tablet Take 1.5 tablets (75 mg total) by mouth daily.  . metoprolol succinate (TOPROL-XL) 50 MG 24 hr tablet Take 1 tablet (50 mg total) by mouth at bedtime. Take with or immediately following a meal.  . mometasone (NASONEX) 50 MCG/ACT nasal spray Place 2 sprays into the nose daily as needed (allergies).  . pantoprazole (PROTONIX) 40 MG tablet Take 1 tablet (40 mg total) by mouth daily.  . valACYclovir (VALTREX) 1000 MG tablet Take 1 tablet (1,000 mg total) by mouth 3 (three) times daily.  . [DISCONTINUED] losartan (COZAAR) 50 MG tablet Take 50 mg by mouth daily.  . hydrocortisone cream 0.5 % Apply 1 application topically 2 (two) times daily.  . hydrOXYzine (ATARAX/VISTARIL) 10 MG tablet Take 1 tablet (10 mg total) by mouth 3 (three) times daily as needed for itching.   No facility-administered encounter medications on file as of 08/20/2019.    Review of Systems  Constitutional: Negative for chills, fatigue and fever.  HENT: Negative for congestion, rhinorrhea, sinus pressure, sinus pain, sneezing and sore throat.   Eyes: Positive for visual disturbance. Negative for discharge, redness and itching.       Wear eye glasses   Respiratory: Negative for cough, chest tightness, shortness of breath and wheezing.   Cardiovascular: Negative for chest  pain, palpitations and leg swelling.  Gastrointestinal: Negative for abdominal distention, abdominal pain, diarrhea, nausea and vomiting.  Genitourinary: Negative for decreased urine volume, difficulty urinating, dysuria, flank pain, frequency and urgency.  Musculoskeletal: Positive for neck pain. Negative for myalgias.       Left side of neck soreness   Skin: Positive for rash. Negative for color change, pallor and wound.  Neurological: Negative for dizziness, seizures, speech difficulty, weakness, light-headedness, numbness and headaches.  Psychiatric/Behavioral: Negative for agitation and sleep disturbance.    Immunization History  Administered Date(s) Administered  . Hepatitis B 06/06/2016  . Influenza-Unspecified 01/30/2019  . Pneumococcal Conjugate-13 10/01/2014  . Pneumococcal Polysaccharide-23 11/24/2009  . Pneumococcal-Unspecified 06/06/2016  . Tdap 12/11/2010   Pertinent  Health Maintenance Due  Topic Date Due  . DEXA SCAN  Never done  . COLONOSCOPY  08/25/2019  . INFLUENZA VACCINE  Completed  .  PNA vac Low Risk Adult  Completed   Fall Risk  08/20/2019 08/05/2019 07/05/2019  Falls in the past year? 0 0 0  Number falls in past yr: 0 0 0  Injury with Fall? 0 0 0    Vitals:   08/20/19 1544  BP: (!) 150/92  Pulse: 97  Resp: 18  Temp: (!) 97.3 F (36.3 C)  TempSrc: Temporal  SpO2: 97%  Weight: 173 lb (78.5 kg)  Height: 5\' 2"  (1.575 m)   Body mass index is 31.64 kg/m. Physical Exam Vitals reviewed.  Constitutional:      General: She is not in acute distress.    Appearance: She is obese. She is not ill-appearing.  HENT:     Head: Normocephalic.     Right Ear: Tympanic membrane, ear canal and external ear normal. There is no impacted cerumen.     Left Ear: Tympanic membrane, ear canal and external ear normal. There is no impacted cerumen.     Nose: Nose normal. No congestion or rhinorrhea.     Mouth/Throat:     Mouth: Mucous membranes are moist.     Pharynx:  Oropharynx is clear. No oropharyngeal exudate or posterior oropharyngeal erythema.  Eyes:     General: No scleral icterus.       Right eye: No discharge.        Left eye: No discharge.     Conjunctiva/sclera: Conjunctivae normal.     Pupils: Pupils are equal, round, and reactive to light.  Neck:     Vascular: No carotid bruit.  Cardiovascular:     Rate and Rhythm: Normal rate and regular rhythm.     Pulses: Normal pulses.     Heart sounds: Normal heart sounds. No murmur. No friction rub. No gallop.   Pulmonary:     Effort: Pulmonary effort is normal. No respiratory distress.     Breath sounds: Normal breath sounds. No wheezing, rhonchi or rales.  Chest:     Chest wall: No tenderness.  Abdominal:     General: Bowel sounds are normal. There is no distension.     Palpations: Abdomen is soft. There is no mass.     Tenderness: There is no abdominal tenderness. There is no right CVA tenderness, left CVA tenderness, guarding or rebound.  Musculoskeletal:     Cervical back: Normal range of motion. No rigidity or tenderness.  Lymphadenopathy:     Cervical: No cervical adenopathy.  Skin:    General: Skin is warm and dry.     Coloration: Skin is not pale.     Findings: No bruising or erythema.     Comments: Circular red wheals with small nodule in the center in a linear pattern across left forearm x 4 lesion noted.similar wheals x 2 large than arms noted also on right side of the neck in a linear patter toward the jawline.   Right upper back previous rash cluster progressive healing.No vesicles noted.   Neurological:     Mental Status: She is alert and oriented to person, place, and time.     Cranial Nerves: No cranial nerve deficit.     Sensory: No sensory deficit.     Motor: No weakness.     Gait: Gait normal.  Psychiatric:        Mood and Affect: Mood normal.        Behavior: Behavior normal.        Thought Content: Thought content normal.  Judgment: Judgment normal.      Labs reviewed: Recent Labs    07/05/19 1206  NA 139  K 4.4  CL 105  CO2 27  GLUCOSE 96  BUN 14  CREATININE 0.90  CALCIUM 9.9   Recent Labs    07/05/19 1206  AST 22  ALT 20  BILITOT 0.4  PROT 6.8   Recent Labs    07/05/19 1206  WBC 5.5  NEUTROABS 2,833  HGB 11.1*  HCT 35.0  MCV 77.6*  PLT 351   Lab Results  Component Value Date   TSH 1.46 07/05/2019   Lab Results  Component Value Date   HGBA1C 6.1 (H) 07/05/2019   Lab Results  Component Value Date   CHOL 173 07/05/2019   HDL 71 07/05/2019   LDLCALC 83 07/05/2019   TRIG 101 07/05/2019   CHOLHDL 2.4 07/05/2019    Significant Diagnostic Results in last 30 days:  No results found.  Assessment/Plan 1. Rash and nonspecific skin eruption Afebrile.Circular red wheals with small nodule in the center in a linear pattern across left forearm x 4 lesion noted.similar wheals x 2 large than arms noted also on right side of the neck in a linear patter toward the jawline.Suspect insect bite lesion on left forearm and right neck unlikely to be shingles due to linear pattern not on same dermatone and no vesicles noted.  Right upper back previous rash cluster progressive healing.No vesicles noted. - Advised to wash bedding with warm water and soap and clean sofa areas too due to  possible insect bite on left hand and right side of the neck.  - Take Hydroxyzine 10 mg tablet one by mouth three times daily as needed for itching  - may apply hydrocortisone cream to itchy rash twice daily. - hydrOXYzine (ATARAX/VISTARIL) 10 MG tablet; Take 1 tablet (10 mg total) by mouth 3 (three) times daily as needed for itching.  Dispense: 30 tablet; Refill: 0 - hydrocortisone cream 0.5 %; Apply 1 application topically 2 (two) times daily.  Dispense: 30 g; Refill: 0  2. Neck pain, chronic Chronic pain though reports worsening on left side of the neck request referral to Neurologist Dr.Robert Nudelman. - Ambulatory referral to  Neurosurgery  3. Itching - hydrOXYzine (ATARAX/VISTARIL) 10 MG tablet; Take 1 tablet (10 mg total) by mouth 3 (three) times daily as needed for itching.  Dispense: 30 tablet; Refill: 0 - hydrocortisone cream 0.5 %; Apply 1 application topically 2 (two) times daily.  Dispense: 30 g; Refill: 0  4. Essential hypertension - B/p elevated upon arrival to visit though stated was rushing up the stairs taking care of Grandchildren prior to visit.Rechecked B/p after resting still elevated.Clonidine 0.1 mg tablet x 1 given with much improvement.Adivsed to increase Losartan from 50 mg tablet to 75 mg tablet daily.  - Advised to check blood pressure once daily at home and record on log and bring log to visit in one week  - losartan (COZAAR) 50 MG tablet; Take 1.5 tablets (75 mg total) by mouth daily.  Dispense: 90 tablet; Refill: 1  Family/ staff Communication: Reviewed plan of care with patient verbalized understanding.   Labs/tests ordered: None   Next Appointment: 1 week for evaluation of Blood pressure.   Sandrea Hughs, NP

## 2019-08-20 NOTE — Patient Instructions (Addendum)
- Take Hydroxyzine 10 mg tablet one by mouth three times daily as needed for itching  - may apply hydrocortisone cream to itchy rash twice daily. - wash bedding with warm water and soap and clean sofa areas too due to  possible insect bite on your left hand and right side of the neck.  - Notify provider if symptoms worsen or not resolve  Increase Losartan from 50 mg tablet to 75  mg Tablet daily  - check blood pressure once daily at home and write on log,Please bring log to visit in one week   Hypertension, Adult Hypertension is another name for high blood pressure. High blood pressure forces your heart to work harder to pump blood. This can cause problems over time. There are two numbers in a blood pressure reading. There is a top number (systolic) over a bottom number (diastolic). It is best to have a blood pressure that is below 120/80. Healthy choices can help lower your blood pressure, or you may need medicine to help lower it. What are the causes? The cause of this condition is not known. Some conditions may be related to high blood pressure. What increases the risk?  Smoking.  Having type 2 diabetes mellitus, high cholesterol, or both.  Not getting enough exercise or physical activity.  Being overweight.  Having too much fat, sugar, calories, or salt (sodium) in your diet.  Drinking too much alcohol.  Having long-term (chronic) kidney disease.  Having a family history of high blood pressure.  Age. Risk increases with age.  Race. You may be at higher risk if you are African American.  Gender. Men are at higher risk than women before age 24. After age 50, women are at higher risk than men.  Having obstructive sleep apnea.  Stress. What are the signs or symptoms?  High blood pressure may not cause symptoms. Very high blood pressure (hypertensive crisis) may cause: ? Headache. ? Feelings of worry or nervousness (anxiety). ? Shortness of breath. ? Nosebleed. ? A  feeling of being sick to your stomach (nausea). ? Throwing up (vomiting). ? Changes in how you see. ? Very bad chest pain. ? Seizures. How is this treated?  This condition is treated by making healthy lifestyle changes, such as: ? Eating healthy foods. ? Exercising more. ? Drinking less alcohol.  Your health care provider may prescribe medicine if lifestyle changes are not enough to get your blood pressure under control, and if: ? Your top number is above 130. ? Your bottom number is above 80.  Your personal target blood pressure may vary. Follow these instructions at home: Eating and drinking   If told, follow the DASH eating plan. To follow this plan: ? Fill one half of your plate at each meal with fruits and vegetables. ? Fill one fourth of your plate at each meal with whole grains. Whole grains include whole-wheat pasta, brown rice, and whole-grain bread. ? Eat or drink low-fat dairy products, such as skim milk or low-fat yogurt. ? Fill one fourth of your plate at each meal with low-fat (lean) proteins. Low-fat proteins include fish, chicken without skin, eggs, beans, and tofu. ? Avoid fatty meat, cured and processed meat, or chicken with skin. ? Avoid pre-made or processed food.  Eat less than 1,500 mg of salt each day.  Do not drink alcohol if: ? Your doctor tells you not to drink. ? You are pregnant, may be pregnant, or are planning to become pregnant.  If you drink  alcohol: ? Limit how much you use to:  0-1 drink a day for women.  0-2 drinks a day for men. ? Be aware of how much alcohol is in your drink. In the [place] one drink equals one 12 oz bottle of beer (355 mL), one 5 oz glass of wine (148 mL), or one 1 oz glass of hard liquor (44 mL). Lifestyle   Work with your doctor to stay at a healthy weight or to lose weight. Ask your doctor what the best weight is for you.  Get at least 30 minutes of exercise most days of the week. This may include walking,  swimming, or biking.  Get at least 30 minutes of exercise that strengthens your muscles (resistance exercise) at least 3 days a week. This may include lifting weights or doing Pilates.  Do not use any products that contain nicotine or tobacco, such as cigarettes, e-cigarettes, and chewing tobacco. If you need help quitting, ask your doctor.  Check your blood pressure at home as told by your doctor.  Keep all follow-up visits as told by your doctor. This is important. Medicines  Take over-the-counter and prescription medicines only as told by your doctor. Follow directions carefully.  Do not skip doses of blood pressure medicine. The medicine does not work as well if you skip doses. Skipping doses also puts you at risk for problems.  Ask your doctor about side effects or reactions to medicines that you should watch for. Contact a doctor if you:  Think you are having a reaction to the medicine you are taking.  Have headaches that keep coming back (recurring).  Feel dizzy.  Have swelling in your ankles.  Have trouble with your vision. Get help right away if you:  Get a very bad headache.  Start to feel mixed up (confused).  Feel weak or numb.  Feel faint.  Have very bad pain in your: ? Chest. ? Belly (abdomen).  Throw up more than once.  Have trouble breathing. Summary  Hypertension is another name for high blood pressure.  High blood pressure forces your heart to work harder to pump blood.  For most people, a normal blood pressure is less than 120/80.  Making healthy choices can help lower blood pressure. If your blood pressure does not get lower with healthy choices, you may need to take medicine. This information is not intended to replace advice given to you by your health care provider. Make sure you discuss any questions you have with your health care provider. Document Revised: 01/31/2018 Document Reviewed: 01/31/2018 Elsevier Patient Education  2020  [employer].

## 2019-08-22 DIAGNOSIS — L518 Other erythema multiforme: Secondary | ICD-10-CM | POA: Diagnosis not present

## 2019-08-26 ENCOUNTER — Other Ambulatory Visit: Payer: Self-pay

## 2019-08-26 ENCOUNTER — Encounter: Payer: Self-pay | Admitting: Internal Medicine

## 2019-08-26 ENCOUNTER — Other Ambulatory Visit: Payer: Self-pay | Admitting: Internal Medicine

## 2019-08-26 ENCOUNTER — Ambulatory Visit (INDEPENDENT_AMBULATORY_CARE_PROVIDER_SITE_OTHER): Payer: Medicare HMO | Admitting: Internal Medicine

## 2019-08-26 VITALS — BP 152/92 | HR 78 | Temp 97.7°F | Ht 60.0 in | Wt 169.0 lb

## 2019-08-26 DIAGNOSIS — M542 Cervicalgia: Secondary | ICD-10-CM | POA: Diagnosis not present

## 2019-08-26 DIAGNOSIS — I10 Essential (primary) hypertension: Secondary | ICD-10-CM

## 2019-08-26 DIAGNOSIS — R21 Rash and other nonspecific skin eruption: Secondary | ICD-10-CM

## 2019-08-26 DIAGNOSIS — Z981 Arthrodesis status: Secondary | ICD-10-CM | POA: Diagnosis not present

## 2019-08-26 DIAGNOSIS — M4302 Spondylolysis, cervical region: Secondary | ICD-10-CM | POA: Diagnosis not present

## 2019-08-26 DIAGNOSIS — M5136 Other intervertebral disc degeneration, lumbar region: Secondary | ICD-10-CM | POA: Diagnosis not present

## 2019-08-26 DIAGNOSIS — S129XXA Fracture of neck, unspecified, initial encounter: Secondary | ICD-10-CM | POA: Diagnosis not present

## 2019-08-26 DIAGNOSIS — Z636 Dependent relative needing care at home: Secondary | ICD-10-CM

## 2019-08-26 DIAGNOSIS — M47816 Spondylosis without myelopathy or radiculopathy, lumbar region: Secondary | ICD-10-CM | POA: Diagnosis not present

## 2019-08-26 MED ORDER — LOSARTAN POTASSIUM 100 MG PO TABS: 100.0000 mg | ORAL_TABLET | Freq: Every day | ORAL | 1 refills | Status: DC

## 2019-08-26 NOTE — Patient Instructions (Addendum)
Stop hydroxyzine unless rash returns with a vengeance.  Zyrtec is ok instead.  If you can avoid flexeril, I'd prefer you not take it either.  Continue low sodium diet.    Please increase losartan to 100mg . Check your blood pressure two hours after your morning meds and repeat again in the evening.  Write them down and bring to your next appt in 2 wks.

## 2019-08-26 NOTE — Telephone Encounter (Signed)
Ok to fill 

## 2019-08-26 NOTE — Progress Notes (Signed)
Location:  [facility] clinic  Provider: Dr. Hollace Kinnier  Goals of Care:  Advanced Directives 08/26/2019  Does Patient Have a Medical Advance Directive? -  Type of Advance Directive Living will  Does patient want to make changes to medical advance directive? No - Patient declined  Copy of [place] in Chart? -     Chief Complaint  Patient presents with  . Acute Visit    Follow up on rash     HPI: Patient is a 74 y.o. female seen today for follow up on blood pressure and skin rash.   Covid series   Her questionable rash has resolved. She went to a dermatologist last Thursday. They believe it was not shingles, but a rash. She was given a steroid injection and prescribed hydrocortisone cream. She denies any itching. Still taking hydroxyzine every night.   Her blood pressure is still elevated. She follows a low sodium diet. Takes her losartan and metoprolol daily. Checks her blood pressure daily but did not bring a journal of readings. Expressed her frustration with her husband and living situation. Her husband is having issues with his short term memory. In addition, their property is over an acre of land and difficult to care for. She would like to move, but husband is resistant. Her children are her main support systems.     Past Medical History:  Diagnosis Date  . Arthritis   . Asthma    " allergies"  . Chronic back pain   . Complication of anesthesia 1968   "fought the anesthesia" problems with memory 05/06/15 at [facility]"  . Depression   . Fibromyalgia   . GERD (gastroesophageal reflux disease)   . History of hiatal hernia   . Hypertension   . Hypothyroidism   . PONV (postoperative nausea and vomiting)   . Thyroid disease   . Torn meniscus    left knee  . Wears glasses     Past Surgical History:  Procedure Laterality Date  . ABDOMINAL HYSTERECTOMY  1976  . ANTERIOR CERVICAL DECOMP/DISCECTOMY FUSION N/A 05/06/2015   Procedure: Cervical four-five,  Cervical five-six, Cervical six-seven Anterior cervical decompression/diskectomy/fusion;  Surgeon: Leeroy Cha, MD;  Location: [place] [facility];  Service: Neurosurgery;  Laterality: N/A;  C4-5 C5-6 C6-7 Anterior cervical decompression/diskectomy/fusion  . APPENDECTOMY    . BREAST SURGERY Bilateral    biopsy- 2 different times  . CARPAL TUNNEL RELEASE     B/L  . CHOLECYSTECTOMY    . COLONOSCOPY  2014  . COLONOSCOPY W/ POLYPECTOMY    . dental implants    . DILATION AND CURETTAGE OF UTERUS  1968  . ELBOW ARTHROSCOPY Right    right  . FOOT GANGLION EXCISION Left    left  . KNEE ARTHROSCOPY Right    torn ligament  . LAMINECTOMY     L 4 and 5; 2 separate surgeries  . left hand     carpal tunel  . right hand     3 surgeries- carpal tunel    Allergies  Allergen Reactions  . Beta Adrenergic Blockers Other (See Comments)    Certain beta blockers make me hoarse.  . Mobic [Meloxicam] Hypertension    Made blood pressure extremely high  . Sulfa Antibiotics Itching and Swelling  . Tape Rash    Paper tape  . Aspirin Other (See Comments)    Stomach burning; increased acid  . Oxycodone Other (See Comments)    It makes her someone she's not  Outpatient Encounter Medications as of 08/26/2019  Medication Sig  . Cholecalciferol (D3 SUPER STRENGTH) 2000 UNITS CAPS Take 2,000 Units by mouth daily.   . cyanocobalamin 2000 MCG tablet Take 2,000 mcg by mouth daily.  . cyclobenzaprine (FLEXERIL) 10 MG tablet Take 1/2 -1 tablet by mouth up to twice daily as needed  . estazolam (PROSOM) 2 MG tablet Take 1 tablet (2 mg total) by mouth at bedtime as needed (muscle pain and insomnia).  . hydrocortisone cream 0.5 % Apply 1 application topically 2 (two) times daily.  . hydrOXYzine (ATARAX/VISTARIL) 10 MG tablet Take 1 tablet (10 mg total) by mouth 3 (three) times daily as needed for itching.  . levalbuterol (XOPENEX HFA) 45 MCG/ACT inhaler Inhale 2 puffs into the lungs every 6 (six) hours as needed for  wheezing or shortness of breath.   . levothyroxine (SYNTHROID) 50 MCG tablet Take 1 tablet (50 mcg total) by mouth every morning.  Marland Kitchen losartan (COZAAR) 50 MG tablet Take 1.5 tablets (75 mg total) by mouth daily.  . metoprolol succinate (TOPROL-XL) 50 MG 24 hr tablet Take 1 tablet (50 mg total) by mouth at bedtime. Take with or immediately following a meal.  . mometasone (NASONEX) 50 MCG/ACT nasal spray Place 2 sprays into the nose daily as needed (allergies).  . pantoprazole (PROTONIX) 40 MG tablet Take 1 tablet (40 mg total) by mouth daily.  . valACYclovir (VALTREX) 1000 MG tablet Take 1 tablet (1,000 mg total) by mouth 3 (three) times daily.   No facility-administered encounter medications on file as of 08/26/2019.    Review of Systems:  Review of Systems  Health Maintenance  Topic Date Due  . DEXA SCAN  Never done  . COLONOSCOPY  08/25/2019  . TETANUS/TDAP  12/10/2020  . INFLUENZA VACCINE  Completed  . Hepatitis C Screening  Completed  . PNA vac Low Risk Adult  Completed    Physical Exam: Vitals:   08/26/19 1417  Weight: 169 lb (76.7 kg)  Height: 5' (1.524 m)   Body mass index is 33.01 kg/m. Physical Exam  Labs reviewed: Basic Metabolic Panel: Recent Labs    07/05/19 1206  NA 139  K 4.4  CL 105  CO2 27  GLUCOSE 96  BUN 14  CREATININE 0.90  CALCIUM 9.9  TSH 1.46   Liver Function Tests: Recent Labs    07/05/19 1206  AST 22  ALT 20  BILITOT 0.4  PROT 6.8   No results for input(s): LIPASE, AMYLASE in the last 8760 hours. No results for input(s): AMMONIA in the last 8760 hours. CBC: Recent Labs    07/05/19 1206  WBC 5.5  NEUTROABS 2,833  HGB 11.1*  HCT 35.0  MCV 77.6*  PLT 351   Lipid Panel: Recent Labs    07/05/19 1206  CHOL 173  HDL 71  LDLCALC 83  TRIG 101  CHOLHDL 2.4   Lab Results  Component Value Date   HGBA1C 6.1 (H) 07/05/2019    Procedures since last visit: No results found.  Assessment/Plan 1. Rash and nonspecific skin  eruption - resolved at this time - not conclusive if she had shingles or dermatitis - recommend discontinuing hydroxyzine - may start zyrtec 10 mg by mouth daily - contact PCP if itching returns  2. Essential hypertension - stress in her life is main contributing factor - continue low sodium diet - continue exercise for 30 min, 3-5 times/wk - continue current metoprolol regimen - discontinue losartan 50 mg by mouth daily -  start losartan 100 mg by mouth daily  3. Caregiver stress - her husbands declining health has caused strain in her life - recommend meditation, yoga or reading to relax - recommend time outdoors in the sunlight   Labs/tests ordered:  none Next appt:  10/10/2019

## 2019-08-27 ENCOUNTER — Telehealth: Payer: Self-pay | Admitting: *Deleted

## 2019-08-27 NOTE — Telephone Encounter (Signed)
Noted.  We'll see if she actually needs what she was taking.  We had down that she was on losartan 75mg  not 150mg .

## 2019-08-27 NOTE — Telephone Encounter (Signed)
Patient was just calling to let you know that she was taking Losartan 150mg  but she will only take Losartan 100mg  and Metoprolol 100mg  for her blood pressure. Stated that she will monitor her blood pressure 2 hours after taking her medication. Stated that it was stress that was making it go up. Stated that she was just calling to make you aware.  Reviewed plan of care from [place] note dated 08/26/19:  2. Essential hypertension - stress in her life is main contributing factor - continue low sodium diet - continue exercise for 30 min, 3-5 times/wk - continue current metoprolol regimen - discontinue losartan 50 mg by mouth daily - start losartan 100 mg by mouth daily

## 2019-09-02 ENCOUNTER — Other Ambulatory Visit: Payer: Self-pay | Admitting: Internal Medicine

## 2019-09-02 NOTE — Telephone Encounter (Signed)
Change not appropriate.

## 2019-09-05 ENCOUNTER — Encounter: Payer: Self-pay | Admitting: Internal Medicine

## 2019-09-05 DIAGNOSIS — R928 Other abnormal and inconclusive findings on diagnostic imaging of breast: Secondary | ICD-10-CM | POA: Diagnosis not present

## 2019-09-09 ENCOUNTER — Other Ambulatory Visit: Payer: Self-pay

## 2019-09-09 ENCOUNTER — Ambulatory Visit (INDEPENDENT_AMBULATORY_CARE_PROVIDER_SITE_OTHER): Payer: Medicare HMO | Admitting: Internal Medicine

## 2019-09-09 ENCOUNTER — Encounter: Payer: Self-pay | Admitting: Internal Medicine

## 2019-09-09 VITALS — BP 150/92 | HR 63 | Temp 97.5°F | Ht 60.0 in | Wt 167.4 lb

## 2019-09-09 DIAGNOSIS — I10 Essential (primary) hypertension: Secondary | ICD-10-CM | POA: Diagnosis not present

## 2019-09-09 DIAGNOSIS — L519 Erythema multiforme, unspecified: Secondary | ICD-10-CM | POA: Diagnosis not present

## 2019-09-09 DIAGNOSIS — N6310 Unspecified lump in the right breast, unspecified quadrant: Secondary | ICD-10-CM

## 2019-09-09 MED ORDER — BETAMETHASONE DIPROPIONATE 0.05 % EX CREA: TOPICAL_CREAM | Freq: Two times a day (BID) | CUTANEOUS | 0 refills | Status: DC | PRN

## 2019-09-09 MED ORDER — METOPROLOL SUCCINATE ER 100 MG PO TB24: 100.0000 mg | ORAL_TABLET | Freq: Every day | ORAL | 5 refills | Status: DC

## 2019-09-09 NOTE — Patient Instructions (Signed)
Call back if need mammogram order at [facility].

## 2019-09-09 NOTE — Progress Notes (Signed)
Location:  [facility] clinic Provider:  Jetta Murray L. Mariea Clonts, D.O., C.M.D.  Goals of Care:  Advanced Directives 09/09/2019  Does Patient Have a Medical Advance Directive? -  Type of Advance Directive [place]  Does patient want to make changes to medical advance directive? No - Patient declined  Copy of [place] in Chart? -   Chief Complaint  Patient presents with  . Medical Management of Chronic Issues    2 week follow up for blood pressure    HPI: Patient is a 74 y.o. female seen today for two week f/u bp.    Sees derm again on 4/19.  All spots gone except one on right side of face yesterday and two now on left side came up this morning.  She says she made the mistake of bragging at church that they were gone.    BPs at home look good:  140/89, 146/76, 122/74, 123/70, 138/87, 144/77 at home.  Had been drinking   Mammogram was abnormal and now she needs stereotactic biopsy.  The spot had grown.  She would like to go to the breast center for a second opinion.  She's had a lot of breast biopsies in her life.  She's had a lot of cysts that required aspiration.  She's never actually had cancer in any of them.  She'd had 11 different cysts in the right breast.  She also had a staph infection in that breast that required over 2 mos abx.  One sister had breast cancer but she'd been on provera--had lumpectomy, XRT and chemo and doing well.    Past Medical History:  Diagnosis Date  . Arthritis   . Asthma    " allergies"  . Chronic back pain   . Complication of anesthesia 1968   "fought the anesthesia" problems with memory 05/06/15 at [facility]"  . Depression   . Fibromyalgia   . GERD (gastroesophageal reflux disease)   . History of hiatal hernia   . Hypertension   . Hypothyroidism   . PONV (postoperative nausea and vomiting)   . Thyroid disease   . Torn meniscus    left knee  . Wears glasses     Past Surgical History:  Procedure Laterality Date  .  ABDOMINAL HYSTERECTOMY  1976  . ANTERIOR CERVICAL DECOMP/DISCECTOMY FUSION N/A 05/06/2015   Procedure: Cervical four-five, Cervical five-six, Cervical six-seven Anterior cervical decompression/diskectomy/fusion;  Surgeon: Leeroy Cha, MD;  Location: [place] [facility];  Service: Neurosurgery;  Laterality: N/A;  C4-5 C5-6 C6-7 Anterior cervical decompression/diskectomy/fusion  . APPENDECTOMY    . BREAST SURGERY Bilateral    biopsy- 2 different times  . CARPAL TUNNEL RELEASE     B/L  . CHOLECYSTECTOMY    . COLONOSCOPY  2014  . COLONOSCOPY W/ POLYPECTOMY    . dental implants    . DILATION AND CURETTAGE OF UTERUS  1968  . ELBOW ARTHROSCOPY Right    right  . FOOT GANGLION EXCISION Left    left  . KNEE ARTHROSCOPY Right    torn ligament  . LAMINECTOMY     L 4 and 5; 2 separate surgeries  . left hand     carpal tunel  . right hand     3 surgeries- carpal tunel    Allergies  Allergen Reactions  . Beta Adrenergic Blockers Other (See Comments)    Certain beta blockers make me hoarse.  . Mobic [Meloxicam] Hypertension    Made blood pressure extremely high  .  Sulfa Antibiotics Itching and Swelling  . Tape Rash    Paper tape  . Aspirin Other (See Comments)    Stomach burning; increased acid  . Oxycodone Other (See Comments)    It makes her someone she's not     Outpatient Encounter Medications as of 09/09/2019  Medication Sig  . aspirin EC 81 MG tablet Take 81 mg by mouth daily.  . Cholecalciferol (D3 SUPER STRENGTH) 2000 UNITS CAPS Take 2,000 Units by mouth daily.   . cyanocobalamin 2000 MCG tablet Take 2,000 mcg by mouth daily.  . cyclobenzaprine (FLEXERIL) 10 MG tablet Take 10 mg by mouth 3 (three) times daily as needed for muscle spasms.  Marland Kitchen estazolam (PROSOM) 2 MG tablet Take 1 tablet (2 mg total) by mouth at bedtime as needed (muscle pain and insomnia).  . Glucosamine 500 MG CAPS Take by mouth.  . hydrocortisone cream 0.5 % Apply 1 application topically 2 (two) times daily.  Marland Kitchen  levalbuterol (XOPENEX HFA) 45 MCG/ACT inhaler Inhale 2 puffs into the lungs every 6 (six) hours as needed for wheezing or shortness of breath.   . levocetirizine (XYZAL) 5 MG tablet Take 5 mg by mouth every evening.  Marland Kitchen levothyroxine (SYNTHROID) 50 MCG tablet Take 1 tablet (50 mcg total) by mouth every morning.  Marland Kitchen losartan (COZAAR) 100 MG tablet Take 1 tablet (100 mg total) by mouth daily.  . metoprolol succinate (TOPROL-XL) 50 MG 24 hr tablet Take 1 tablet (50 mg total) by mouth at bedtime. Take with or immediately following a meal.  . mometasone (NASONEX) 50 MCG/ACT nasal spray Place 2 sprays into the nose daily as needed (allergies).  . pantoprazole (PROTONIX) 40 MG tablet Take 1 tablet (40 mg total) by mouth daily.  . valACYclovir (VALTREX) 1000 MG tablet Take 1 tablet (1,000 mg total) by mouth 3 (three) times daily.   No facility-administered encounter medications on file as of 09/09/2019.    Review of Systems:  Review of Systems  Constitutional: Negative for chills, fever and malaise/fatigue.  Eyes: Negative for blurred vision.  Respiratory: Negative for cough and shortness of breath.   Cardiovascular: Negative for chest pain, palpitations and leg swelling.  Gastrointestinal: Negative for abdominal pain, constipation, diarrhea, heartburn, nausea and vomiting.  Genitourinary: Negative for dysuria.  Musculoskeletal: Positive for myalgias (left lower ribs tender since dancing with grandchildren this weekend). Negative for falls and joint pain.  Skin: Positive for itching and rash.  Neurological: Negative for dizziness and loss of consciousness.  Endo/Heme/Allergies: Positive for environmental allergies.  Psychiatric/Behavioral: Negative for depression and memory loss. The patient is nervous/anxious. The patient does not have insomnia.        Caregiver stress    Health Maintenance  Topic Date Due  . DEXA SCAN  Never done  . COLONOSCOPY  08/25/2019  . INFLUENZA VACCINE  01/05/2020  .  TETANUS/TDAP  12/10/2020  . Hepatitis C Screening  Completed  . PNA vac Low Risk Adult  Completed    Physical Exam: Vitals:   09/09/19 1042  BP: (!) 150/92  Pulse: 63  Temp: (!) 97.5 F (36.4 C)  TempSrc: Temporal  SpO2: 99%  Weight: 167 lb 6.4 oz (75.9 kg)  Height: 5' (1.524 m)   Body mass index is 32.69 kg/m. Physical Exam Vitals reviewed.  Constitutional:      General: She is not in acute distress.    Appearance: Normal appearance. She is not toxic-appearing.  HENT:     Head: Normocephalic and atraumatic.  Eyes:  Comments: glasses  Cardiovascular:     Rate and Rhythm: Normal rate and regular rhythm.     Pulses: Normal pulses.     Heart sounds: Normal heart sounds.  Pulmonary:     Effort: Pulmonary effort is normal.     Breath sounds: Normal breath sounds. No wheezing, rhonchi or rales.  Chest:     Breasts:        Right: Mass and tenderness present.        Left: Normal.     Comments: Right breast mass at 1030 Abdominal:     General: Bowel sounds are normal.     Tenderness: There is guarding.  Musculoskeletal:        General: Normal range of motion.     Right lower leg: No edema.     Left lower leg: No edema.  Skin:    General: Skin is warm and dry.     Comments: Hives--one right face and two left face  Neurological:     General: No focal deficit present.     Mental Status: She is alert and oriented to person, place, and time.  Psychiatric:        Mood and Affect: Mood normal.        Behavior: Behavior normal.     Labs reviewed: Basic Metabolic Panel: Recent Labs    01/23/19 0000 07/05/19 1206  NA 141 139  K 4.2 4.4  CL 106 105  CO2 29* 27  GLUCOSE  --  96  BUN 17 14  CREATININE 0.9 0.90  CALCIUM 9.4 9.9  TSH  --  1.46   Liver Function Tests: Recent Labs    01/23/19 0000 07/05/19 1206  AST 25 22  ALT 27 20  BILITOT  --  0.4  PROT  --  6.8  ALBUMIN 3.9  --    No results for input(s): LIPASE, AMYLASE in the last 8760 hours. No  results for input(s): AMMONIA in the last 8760 hours. CBC: Recent Labs    01/23/19 0000 07/05/19 1206  WBC 7.8 5.5  NEUTROABS  --  2,833  HGB 11.8* 11.1*  HCT 37 35.0  MCV  --  77.6*  PLT 324 351   Lipid Panel: Recent Labs    07/05/19 1206  CHOL 173  HDL 71  LDLCALC 83  TRIG 101  CHOLHDL 2.4   Lab Results  Component Value Date   HGBA1C 6.1 (H) 07/05/2019   Assessment/Plan 1. Breast mass, right -stereotactic biopsy recommended at [facility], but pt is now wanting a second opinion at [facility] after she was told she had "a little cancer in there" before she even had a biopsy done (and has had numerous benign cysts in the past) -she is to call me back after she confirms with her insurance that she can get the second opinion right diagnostic mammogram  2. Essential hypertension -bp high here but sounds like it's good much of the time at home -we finally got her meds straight today b/c she brought them along -is actually taking metoprolol succinate (TOPROL-XL) 100 MG 24 hr tablet; Take 1 tablet (100 mg total) by mouth at bedtime. Take with or immediately following a meal.  Dispense: 30 tablet; Refill: 5 (not the 50mg  we thought she was taking)  3. Erythema multiforme -cont antihistamine thru derm and cream and keep f/u as planned (had been better and then returned over weekend) - betamethasone dipropionate 0.05 % cream; Apply topically 2 (two) times daily as  needed.  Dispense: 45 g; Refill: 0   Next appt:  10/10/2019   Victoria Hudson L. Frida Wahlstrom, D.O. [facility] [address], [place] [address] Cell Phone (Mon-Fri 8am-5pm):  [phone] On Call:  [phone] & follow prompts after 5pm & weekends Office Phone:  [phone] Office Fax:  [phone]

## 2019-09-10 ENCOUNTER — Other Ambulatory Visit: Payer: Self-pay | Admitting: Internal Medicine

## 2019-09-10 ENCOUNTER — Ambulatory Visit
Admission: RE | Admit: 2019-09-10 | Discharge: 2019-09-10 | Disposition: A | Discharge status: Home / Self Care | Payer: Medicare Other | Source: Ambulatory Visit | Attending: Internal Medicine | Admitting: Internal Medicine

## 2019-09-10 ENCOUNTER — Other Ambulatory Visit: Payer: Self-pay

## 2019-09-10 DIAGNOSIS — N6489 Other specified disorders of breast: Secondary | ICD-10-CM

## 2019-09-10 DIAGNOSIS — N6311 Unspecified lump in the right breast, upper outer quadrant: Secondary | ICD-10-CM | POA: Diagnosis not present

## 2019-09-10 NOTE — Progress Notes (Signed)
Please notify Chau:  Results reviewed of her imaging and radiology impressions at the [facility] and I agree with the plan for biopsy, mass excision on right breast and imaging of the left breast with mammogram and ultrasound.

## 2019-09-11 ENCOUNTER — Other Ambulatory Visit: Payer: Self-pay | Admitting: Internal Medicine

## 2019-09-11 ENCOUNTER — Telehealth: Payer: Self-pay | Admitting: *Deleted

## 2019-09-11 DIAGNOSIS — R928 Other abnormal and inconclusive findings on diagnostic imaging of breast: Secondary | ICD-10-CM

## 2019-09-11 NOTE — Telephone Encounter (Signed)
The breast center had sent me the reports and I had actually commented on them already.  They also sent me the orders this morning and I just signed them for the biopsy.  Best wishes to her.  I hope things turn out well.

## 2019-09-11 NOTE — Telephone Encounter (Signed)
Patient called and stated that she saw Dr. Mariea Clonts on 09/09/19 for Breast mass. Had a Mammogram with [facility] and wanted a second opinion.  Stated that she went to the [facility] and got a second opinion. Stated that she needs an order placed for a Stereotactic Biopsy to be done at the [facility]. Please Advise.

## 2019-09-13 ENCOUNTER — Ambulatory Visit: Payer: Medicare HMO | Attending: Internal Medicine

## 2019-09-13 DIAGNOSIS — Z23 Encounter for immunization: Secondary | ICD-10-CM

## 2019-09-13 NOTE — Progress Notes (Signed)
   Covid-19 Vaccination Clinic  Name:  Victoria Hudson    MRN: [id number] DOB: 11-Dec-1945  09/13/2019  Ms. Neighbor was observed post Covid-19 immunization for 30 minutes based on pre-vaccination screening without incident. She was provided with Vaccine Information Sheet and instruction to access the V-Safe system.   [name] was instructed to call 911 with any severe reactions post vaccine: Marland Kitchen Difficulty breathing  . Swelling of face and throat  . A fast heartbeat  . A bad rash all over body  . Dizziness and weakness   Immunizations Administered    Name Date Dose VIS Date Route   Pfizer COVID-19 Vaccine 09/13/2019  2:40 PM 0.3 mL 05/17/2019 Intramuscular   Manufacturer: [place]   Lot: [id number]   [place]: [id number]

## 2019-09-16 ENCOUNTER — Ambulatory Visit
Admission: RE | Admit: 2019-09-16 | Discharge: 2019-09-16 | Disposition: A | Discharge status: Home / Self Care | Payer: Medicare HMO | Source: Ambulatory Visit | Attending: Internal Medicine | Admitting: Internal Medicine

## 2019-09-16 ENCOUNTER — Other Ambulatory Visit: Payer: Self-pay

## 2019-09-16 DIAGNOSIS — R928 Other abnormal and inconclusive findings on diagnostic imaging of breast: Secondary | ICD-10-CM

## 2019-09-16 DIAGNOSIS — N6311 Unspecified lump in the right breast, upper outer quadrant: Secondary | ICD-10-CM | POA: Diagnosis not present

## 2019-09-16 DIAGNOSIS — N6011 Diffuse cystic mastopathy of right breast: Secondary | ICD-10-CM | POA: Diagnosis not present

## 2019-09-17 ENCOUNTER — Other Ambulatory Visit: Payer: Self-pay | Admitting: Internal Medicine

## 2019-09-17 DIAGNOSIS — N6459 Other signs and symptoms in breast: Secondary | ICD-10-CM

## 2019-09-23 ENCOUNTER — Other Ambulatory Visit: Payer: Self-pay | Admitting: Physician Assistant

## 2019-09-23 DIAGNOSIS — L309 Dermatitis, unspecified: Secondary | ICD-10-CM | POA: Diagnosis not present

## 2019-09-23 DIAGNOSIS — M310 Hypersensitivity angiitis: Secondary | ICD-10-CM | POA: Diagnosis not present

## 2019-09-23 DIAGNOSIS — D485 Neoplasm of uncertain behavior of skin: Secondary | ICD-10-CM | POA: Diagnosis not present

## 2019-10-02 ENCOUNTER — Other Ambulatory Visit: Payer: Self-pay

## 2019-10-02 ENCOUNTER — Ambulatory Visit
Admission: RE | Admit: 2019-10-02 | Discharge: 2019-10-02 | Disposition: A | Discharge status: Home / Self Care | Payer: Medicare HMO | Source: Ambulatory Visit | Attending: Internal Medicine | Admitting: Internal Medicine

## 2019-10-02 DIAGNOSIS — N6459 Other signs and symptoms in breast: Secondary | ICD-10-CM

## 2019-10-02 DIAGNOSIS — R928 Other abnormal and inconclusive findings on diagnostic imaging of breast: Secondary | ICD-10-CM | POA: Diagnosis not present

## 2019-10-02 DIAGNOSIS — N6489 Other specified disorders of breast: Secondary | ICD-10-CM | POA: Diagnosis not present

## 2019-10-07 ENCOUNTER — Ambulatory Visit: Payer: Medicare HMO | Attending: Internal Medicine

## 2019-10-07 DIAGNOSIS — Z23 Encounter for immunization: Secondary | ICD-10-CM

## 2019-10-07 NOTE — Progress Notes (Signed)
   Covid-19 Vaccination Clinic  Name:  Victoria Hudson    MRN: [id number] DOB: April 03, 1946  10/07/2019  [name] was observed post Covid-19 immunization for 15 minutes without incident. She was provided with Vaccine Information Sheet and instruction to access the V-Safe system.   [name] was instructed to call 911 with any severe reactions post vaccine: Marland Kitchen Difficulty breathing  . Swelling of face and throat  . A fast heartbeat  . A bad rash all over body  . Dizziness and weakness   Immunizations Administered    Name Date Dose VIS Date Route   Pfizer COVID-19 Vaccine 10/07/2019 12:00 PM 0.3 mL 07/31/2018 Intramuscular   Manufacturer: [place]   Lot: [id number]   [place]: [id number]

## 2019-10-10 ENCOUNTER — Ambulatory Visit: Payer: Medicare HMO | Admitting: Internal Medicine

## 2019-10-10 ENCOUNTER — Ambulatory Visit: Payer: Self-pay | Admitting: General Surgery

## 2019-10-10 DIAGNOSIS — N6310 Unspecified lump in the right breast, unspecified quadrant: Secondary | ICD-10-CM

## 2019-10-16 ENCOUNTER — Other Ambulatory Visit: Payer: Self-pay | Admitting: General Surgery

## 2019-10-16 DIAGNOSIS — N6310 Unspecified lump in the right breast, unspecified quadrant: Secondary | ICD-10-CM

## 2019-10-31 ENCOUNTER — Ambulatory Visit (INDEPENDENT_AMBULATORY_CARE_PROVIDER_SITE_OTHER): Payer: Medicare HMO | Admitting: Internal Medicine

## 2019-10-31 ENCOUNTER — Encounter: Payer: Self-pay | Admitting: Internal Medicine

## 2019-10-31 ENCOUNTER — Other Ambulatory Visit: Payer: Self-pay

## 2019-10-31 VITALS — BP 110/80 | HR 78 | Temp 97.9°F | Wt 170.2 lb

## 2019-10-31 DIAGNOSIS — R21 Rash and other nonspecific skin eruption: Secondary | ICD-10-CM | POA: Diagnosis not present

## 2019-10-31 DIAGNOSIS — Z636 Dependent relative needing care at home: Secondary | ICD-10-CM

## 2019-10-31 DIAGNOSIS — N6310 Unspecified lump in the right breast, unspecified quadrant: Secondary | ICD-10-CM | POA: Diagnosis not present

## 2019-10-31 DIAGNOSIS — I10 Essential (primary) hypertension: Secondary | ICD-10-CM

## 2019-11-01 NOTE — Progress Notes (Signed)
Location:  [facility] clinic Provider:  Iyan Flett L. Mariea Clonts, D.O., C.M.D.  Goals of Care:  Advanced Directives 11/07/2019  Does Patient Have a Medical Advance Directive? Yes  Type of Paramedic of [place];Living will  Does patient want to make changes to medical advance directive? No - Patient declined  Copy of [place] in Chart? No - copy requested     Chief Complaint  Patient presents with  . Medical Management of Chronic Issues    4 month follow up     HPI: Patient is a 74 y.o. female seen today for medical management of chronic diseases.    Victoria Hudson is scheduled for breast surgery in 2 weeks to get the suspicious mass removed from her right breast.  She is clearly not convinced it is cancerous after the ordeal she's gone through with it and prior surgery and variable opinions.  She continues to get skin breakouts.  They will clear up in one spot and then appear on others.  She is using triamcinolone ointment from derm.  Had a biopsy of a spot on her left arm that "didn't show anything".  She now has one on her neck and they move in a line.    She was upset about her husband who has a uti and didn't want to take his antibiotic and probiotic and doesn't take his bp meds so his bp is up at night.    Hers improved to normal on recheck here:  110/80. Past Medical History:  Diagnosis Date  . Arthritis   . Asthma    " allergies"  . Chronic back pain   . Complication of anesthesia 1968   "fought the anesthesia" problems with memory 05/06/15 at [facility]  . Depression   . Fibromyalgia   . GERD (gastroesophageal reflux disease)   . History of hiatal hernia   . Hypertension   . Hypothyroidism   . PONV (postoperative nausea and vomiting)   . Thyroid disease   . Torn meniscus    left knee  . Wears glasses     Past Surgical History:  Procedure Laterality Date  . ABDOMINAL HYSTERECTOMY  1976  . ANTERIOR CERVICAL DECOMP/DISCECTOMY FUSION N/A  05/06/2015   Procedure: Cervical four-five, Cervical five-six, Cervical six-seven Anterior cervical decompression/diskectomy/fusion;  Surgeon: Leeroy Cha, MD;  Location: [facility];  Service: Neurosurgery;  Laterality: N/A;  C4-5 C5-6 C6-7 Anterior cervical decompression/diskectomy/fusion  . APPENDECTOMY    . BACK SURGERY    . BREAST SURGERY Bilateral    biopsy- 2 different times  . CARPAL TUNNEL RELEASE     B/L  . CHOLECYSTECTOMY    . COLONOSCOPY  2014  . COLONOSCOPY W/ POLYPECTOMY    . dental implants    . DILATION AND CURETTAGE OF UTERUS  1968  . ELBOW ARTHROSCOPY Right    right  . FOOT GANGLION EXCISION Left    left  . KNEE ARTHROSCOPY Right    torn ligament  . LAMINECTOMY     L 4 and 5; 2 separate surgeries  . left hand     carpal tunel  . right hand     3 surgeries- carpal tunel    Allergies  Allergen Reactions  . Beta Adrenergic Blockers Other (See Comments)    Certain beta blockers make me hoarse.  . Mobic [Meloxicam] Hypertension    Made blood pressure extremely high  . Sulfa Antibiotics Itching and Swelling  . Tape Rash    Paper  tape  . Aspirin Other (See Comments)    Stomach burning; increased acid  . Oxycodone Other (See Comments)    It makes her someone she's not     Outpatient Encounter Medications as of 10/31/2019  Medication Sig  . aspirin EC 81 MG tablet Take 81 mg by mouth daily.  Marland Kitchen triamcinolone (KENALOG) 0.025 % ointment Apply 1 application topically 2 (two) times daily.  . Cholecalciferol (D3 SUPER STRENGTH) 2000 UNITS CAPS Take 2,000 Units by mouth daily.   . cyanocobalamin 2000 MCG tablet Take 2,000 mcg by mouth daily.  . cyclobenzaprine (FLEXERIL) 10 MG tablet Take 10 mg by mouth 3 (three) times daily as needed for muscle spasms.  Marland Kitchen estazolam (PROSOM) 2 MG tablet Take 1 tablet (2 mg total) by mouth at bedtime as needed (muscle pain and insomnia).  . Glucosamine 500 MG CAPS Take by mouth.  . levalbuterol (XOPENEX HFA) 45 MCG/ACT inhaler  Inhale 2 puffs into the lungs every 6 (six) hours as needed for wheezing or shortness of breath.   . levothyroxine (SYNTHROID) 50 MCG tablet Take 1 tablet (50 mcg total) by mouth every morning.  Marland Kitchen losartan (COZAAR) 100 MG tablet Take 1 tablet (100 mg total) by mouth daily.  . metoprolol succinate (TOPROL-XL) 100 MG 24 hr tablet Take 1 tablet (100 mg total) by mouth at bedtime. Take with or immediately following a meal.  . mometasone (NASONEX) 50 MCG/ACT nasal spray Place 2 sprays into the nose daily as needed (allergies).  . pantoprazole (PROTONIX) 40 MG tablet Take 1 tablet (40 mg total) by mouth daily.  . [DISCONTINUED] betamethasone dipropionate 0.05 % cream Apply topically 2 (two) times daily as needed.  . [DISCONTINUED] hydrocortisone cream 0.5 % Apply 1 application topically 2 (two) times daily.  . [DISCONTINUED] levocetirizine (XYZAL) 5 MG tablet Take 5 mg by mouth every evening.   No facility-administered encounter medications on file as of 10/31/2019.    Review of Systems:  Review of Systems  Constitutional: Negative for chills and fever.  HENT: Negative for hearing loss.   Eyes: Negative for blurred vision.  Respiratory: Negative for shortness of breath.   Cardiovascular: Negative for chest pain, palpitations and leg swelling.  Gastrointestinal: Negative for abdominal pain, constipation and diarrhea.  Genitourinary: Negative for dysuria.  Musculoskeletal: Positive for myalgias. Negative for falls and joint pain.  Skin: Positive for itching and rash.  Neurological: Negative for dizziness and loss of consciousness.  Endo/Heme/Allergies: Does not bruise/bleed easily.  Psychiatric/Behavioral: Negative for memory loss. The patient is nervous/anxious.        Caregiver stress    Health Maintenance  Topic Date Due  . DEXA SCAN  Never done  . INFLUENZA VACCINE  01/05/2020  . TETANUS/TDAP  12/10/2020  . COLONOSCOPY  06/06/2024  . COVID-19 Vaccine  Completed  . Hepatitis C  Screening  Completed  . PNA vac Low Risk Adult  Completed    Physical Exam: Vitals:   10/31/19 1145  BP: (!) 160/92  Pulse: 78  Temp: 97.9 F (36.6 C)  SpO2: 97%  Weight: 170 lb 3.2 oz (77.2 kg)   Body mass index is 33.24 kg/m. Physical Exam Vitals reviewed.  Constitutional:      General: She is not in acute distress.    Appearance: Normal appearance. She is not toxic-appearing.  Cardiovascular:     Rate and Rhythm: Normal rate and regular rhythm.     Pulses: Normal pulses.     Heart sounds: Normal heart sounds.  Pulmonary:     Effort: Pulmonary effort is normal.     Breath sounds: Normal breath sounds. No wheezing, rhonchi or rales.  Abdominal:     General: Bowel sounds are normal.  Skin:    Comments: A few papules on her right side of her neck in a lineal pattern  Neurological:     General: No focal deficit present.     Mental Status: She is alert and oriented to person, place, and time.  Psychiatric:        Mood and Affect: Mood normal.     Labs reviewed: Basic Metabolic Panel: Recent Labs    01/23/19 0000 07/05/19 1206  NA 141 139  K 4.2 4.4  CL 106 105  CO2 29* 27  GLUCOSE  --  96  BUN 17 14  CREATININE 0.9 0.90  CALCIUM 9.4 9.9  TSH  --  1.46   Liver Function Tests: Recent Labs    01/23/19 0000 07/05/19 1206  AST 25 22  ALT 27 20  BILITOT  --  0.4  PROT  --  6.8  ALBUMIN 3.9  --    No results for input(s): LIPASE, AMYLASE in the last 8760 hours. No results for input(s): AMMONIA in the last 8760 hours. CBC: Recent Labs    01/23/19 0000 07/05/19 1206  WBC 7.8 5.5  NEUTROABS  --  2,833  HGB 11.8* 11.1*  HCT 37 35.0  MCV  --  77.6*  PLT 324 351   Lipid Panel: Recent Labs    07/05/19 1206  CHOL 173  HDL 71  LDLCALC 83  TRIG 101  CHOLHDL 2.4   Lab Results  Component Value Date   HGBA1C 6.1 (H) 07/05/2019    Reviewed various breast studies she's had  Assessment/Plan 1. Breast mass, right -for lumpectomy early next  month to clear up concerns for good  2. Rash and nonspecific skin eruption -this remains uncertain b/c it has still not cleared up despite treatment of shingles we treated here nor erythema multiforme that derm had diagnosed  -biopsy reportedly not helpful but I haven't seen the path report  3. Caregiver stress -ongoing--worse lately with husband's noncompliance and unwillingness to do anything to help himself from her perspective -when she gets through her breast surgery and rash cleared up, will get her involved with [employer] [facility] caregiver programs  4. Essential hypertension -bp ok after visit completed--cont same regimen, relaxation techniques encouraged  Labs/tests ordered:  No orders of the defined types were placed in this encounter.   Next appt:  03/02/2020   [employer] L. Sidhant Helderman, D.O. [facility] [address], [place] [address] Cell Phone (Mon-Fri 8am-5pm):  [phone] On Call:  [phone] & follow prompts after 5pm & weekends Office Phone:  [phone] Office Fax:  [phone]

## 2019-11-05 ENCOUNTER — Other Ambulatory Visit: Payer: Self-pay

## 2019-11-05 ENCOUNTER — Ambulatory Visit (INDEPENDENT_AMBULATORY_CARE_PROVIDER_SITE_OTHER): Payer: Medicare HMO | Admitting: Family

## 2019-11-05 ENCOUNTER — Encounter: Payer: Self-pay | Admitting: Family

## 2019-11-05 ENCOUNTER — Telehealth: Payer: Self-pay

## 2019-11-05 VITALS — BP 120/98 | HR 67 | Temp 97.7°F | Resp 16 | Ht 60.0 in | Wt 172.3 lb

## 2019-11-05 DIAGNOSIS — W57XXXA Bitten or stung by nonvenomous insect and other nonvenomous arthropods, initial encounter: Secondary | ICD-10-CM | POA: Diagnosis not present

## 2019-11-05 DIAGNOSIS — L03312 Cellulitis of back [any part except buttock]: Secondary | ICD-10-CM

## 2019-11-05 MED ORDER — DOXYCYCLINE HYCLATE 100 MG PO TABS: 100.0000 mg | ORAL_TABLET | Freq: Two times a day (BID) | ORAL | 0 refills | Status: AC

## 2019-11-05 MED ORDER — SACCHAROMYCES BOULARDII 250 MG PO CAPS: 250.0000 mg | ORAL_CAPSULE | Freq: Two times a day (BID) | ORAL | 0 refills | Status: AC

## 2019-11-05 NOTE — Progress Notes (Addendum)
Provider: Shanigua Gibb FNP-C  Gayland Curry, DO  Patient Care Team: Gayland Curry, DO as PCP - General (Geriatric Medicine) Mammography, Teola Bradley (Diagnostic Radiology) Jovita Gamma, MD as Consulting Physician (Neurosurgery) Wilford Corner, MD as Consulting Physician (Gastroenterology) Allyn Kenner, MD (Dermatology) Thornell Sartorius, MD (Otolaryngology)  Extended Emergency Contact Information Primary Emergency Contact: Folsom,Daniel Address: [place], [place] Phone: [phone] Mobile Phone: [phone] Relation: Spouse Secondary Emergency Contact: Sharion Settler, [place] Phone: [phone] Relation: Daughter  Code Status:  Full Code  Goals of care: Advanced Directive information Advanced Directives 11/05/2019  Does Patient Have a Medical Advance Directive? Yes  Type of Advance Directive [place]  Does patient want to make changes to medical advance directive? No - Patient declined  Copy of [place] in Chart? Yes - validated most recent copy scanned in chart (See row information)     Chief Complaint  Patient presents with  . Acute Visit    Bite on Back.    HPI:  Pt is a 74 y.o. female seen today for an acute visit for evaluation of bite on her back.she states found several bed bugs on the head board of the bed and her Samule Ohm that usually lies across the foot of the bed.Also found another bed bug on her recliner chair that she sits on.she has spray her furniture and washed bedding's in warm water.she had a bite on  10/31/2019 and none since then.bite site on the left mid back seems to be worsening.Has used tea tree which helped with itching.she denies any fever,chills or drainage from bite site.Patient was previous seen for similar bug bite but hard denied any presence of bed bug in her house.she apologizes to provider  this visit.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    Past Medical History:  Diagnosis Date  . Arthritis   . Asthma    " allergies"  . Chronic back pain   . Complication of anesthesia 1968   "fought the anesthesia" problems with memory 05/06/15 at [facility]"  . Depression   . Fibromyalgia   . GERD (gastroesophageal reflux disease)   . History of hiatal hernia   . Hypertension   . Hypothyroidism   . PONV (postoperative nausea and vomiting)   . Thyroid disease   . Torn meniscus    left knee  . Wears glasses    Past Surgical History:  Procedure Laterality Date  . ABDOMINAL HYSTERECTOMY  1976  . ANTERIOR CERVICAL DECOMP/DISCECTOMY FUSION N/A 05/06/2015   Procedure: Cervical four-five, Cervical five-six, Cervical six-seven Anterior cervical decompression/diskectomy/fusion;  Surgeon: Leeroy Cha, MD;  Location: [facility];  Service: Neurosurgery;  Laterality: N/A;  C4-5 C5-6 C6-7 Anterior cervical decompression/diskectomy/fusion  . APPENDECTOMY    . BREAST SURGERY Bilateral    biopsy- 2 different times  . CARPAL TUNNEL RELEASE     B/L  . CHOLECYSTECTOMY    . COLONOSCOPY  2014  . COLONOSCOPY W/ POLYPECTOMY    . dental implants    . DILATION AND CURETTAGE OF UTERUS  1968  . ELBOW ARTHROSCOPY Right    right  . FOOT GANGLION EXCISION Left  left  . KNEE ARTHROSCOPY Right    torn ligament  . LAMINECTOMY     L 4 and 5; 2 separate surgeries  . left hand     carpal tunel  . right hand     3 surgeries- carpal tunel    Allergies  Allergen Reactions  . Beta Adrenergic Blockers Other (See Comments)    Certain beta blockers make me hoarse.  . Mobic [Meloxicam] Hypertension    Made blood pressure extremely high  . Sulfa Antibiotics Itching and Swelling  . Tape Rash    Paper tape  . Aspirin Other (See Comments)     Stomach burning; increased acid  . Oxycodone Other (See Comments)    It makes her someone she's not     Outpatient Encounter Medications as of 11/05/2019  Medication Sig  . aspirin EC 81 MG tablet Take 81 mg by mouth daily.  . Cholecalciferol (D3 SUPER STRENGTH) 2000 UNITS CAPS Take 2,000 Units by mouth daily.   . cyanocobalamin 2000 MCG tablet Take 2,000 mcg by mouth daily.  . cyclobenzaprine (FLEXERIL) 10 MG tablet Take 10 mg by mouth 3 (three) times daily as needed for muscle spasms.  Marland Kitchen estazolam (PROSOM) 2 MG tablet Take 1 tablet (2 mg total) by mouth at bedtime as needed (muscle pain and insomnia).  . Glucosamine 500 MG CAPS Take by mouth.  . levalbuterol (XOPENEX HFA) 45 MCG/ACT inhaler Inhale 2 puffs into the lungs every 6 (six) hours as needed for wheezing or shortness of breath.   . levothyroxine (SYNTHROID) 50 MCG tablet Take 1 tablet (50 mcg total) by mouth every morning.  Marland Kitchen losartan (COZAAR) 100 MG tablet Take 1 tablet (100 mg total) by mouth daily.  . metoprolol succinate (TOPROL-XL) 100 MG 24 hr tablet Take 1 tablet (100 mg total) by mouth at bedtime. Take with or immediately following a meal.  . mometasone (NASONEX) 50 MCG/ACT nasal spray Place 2 sprays into the nose daily as needed (allergies).  . pantoprazole (PROTONIX) 40 MG tablet Take 1 tablet (40 mg total) by mouth daily.  Marland Kitchen triamcinolone (KENALOG) 0.025 % ointment Apply 1 application topically 2 (two) times daily.   No facility-administered encounter medications on file as of 11/05/2019.    Review of Systems  Constitutional: Negative for appetite change, chills, fatigue and fever.  HENT: Negative for congestion, rhinorrhea, sinus pressure, sinus pain, sneezing and sore throat.   Respiratory: Negative for cough, chest tightness, shortness of breath and wheezing.   Cardiovascular: Negative for chest pain, palpitations and leg swelling.  Gastrointestinal: Negative for abdominal distention, abdominal pain, diarrhea, nausea  and vomiting.  Musculoskeletal: Negative for arthralgias, gait problem and myalgias.  Skin: Negative for pallor and rash.       Left back bug bite   Neurological: Negative for dizziness, speech difficulty, weakness, light-headedness and headaches.  Psychiatric/Behavioral: Negative for agitation, confusion and sleep disturbance. The patient is not nervous/anxious.     Immunization History  Administered Date(s) Administered  . Hepatitis B 06/06/2016  . Influenza-Unspecified 01/30/2019  . PFIZER SARS-COV-2 Vaccination 09/13/2019, 10/07/2019  . Pneumococcal Conjugate-13 10/01/2014  . Pneumococcal Polysaccharide-23 11/24/2009  . Pneumococcal-Unspecified 06/06/2016  . Tdap 12/11/2010   Pertinent  Health Maintenance Due  Topic Date Due  . DEXA SCAN  Never done  . INFLUENZA VACCINE  01/05/2020  . COLONOSCOPY  06/06/2024  . PNA vac Low Risk Adult  Completed   Fall Risk  11/05/2019 10/31/2019 09/09/2019 08/26/2019 08/20/2019  Falls in the past year?  0 0 0 0 0  Number falls in past yr: 0 0 0 0 0  Injury with Fall? 0 0 0 0 0    Vitals:   11/05/19 1425  BP: (!) 160/100  Pulse: 67  Resp: 16  Temp: 97.7 F (36.5 C)  SpO2: 93%  Weight: 172 lb 4.8 oz (78.2 kg)  Height: 5' (1.524 m)   Body mass index is 33.65 kg/m. Physical Exam Vitals reviewed.  Constitutional:      General: She is not in acute distress.    Appearance: She is obese. She is not ill-appearing.  HENT:     Head: Normocephalic.     Mouth/Throat:     Mouth: Mucous membranes are moist.     Pharynx: Oropharynx is clear. No oropharyngeal exudate or posterior oropharyngeal erythema.  Eyes:     General: No scleral icterus.       Right eye: No discharge.        Left eye: No discharge.     Conjunctiva/sclera: Conjunctivae normal.     Pupils: Pupils are equal, round, and reactive to light.  Neck:     Vascular: No carotid bruit.  Cardiovascular:     Rate and Rhythm: Normal rate and regular rhythm.     Pulses: Normal pulses.      Heart sounds: Normal heart sounds. No murmur. No friction rub. No gallop.   Pulmonary:     Effort: Pulmonary effort is normal. No respiratory distress.     Breath sounds: Normal breath sounds. No wheezing, rhonchi or rales.  Chest:     Chest wall: No tenderness.  Abdominal:     General: Bowel sounds are normal. There is no distension.     Palpations: Abdomen is soft. There is no mass.     Tenderness: There is no abdominal tenderness. There is no right CVA tenderness, left CVA tenderness, guarding or rebound.  Musculoskeletal:     Cervical back: Normal range of motion. No rigidity or tenderness.  Lymphadenopathy:     Cervical: No cervical adenopathy.  Skin:    General: Skin is warm.     Coloration: Skin is not pale.     Findings: No bruising.     Comments: Left flank areas 6 cm x 4 cm erythema with small pinpoint hard center non-tender,warm  to touch. No drainage noted.   Neurological:     Mental Status: She is alert and oriented to person, place, and time.     Sensory: No sensory deficit.     Motor: No weakness.     Gait: Gait normal.  Psychiatric:        Mood and Affect: Mood normal.        Behavior: Behavior normal.        Thought Content: Thought content normal.        Judgment: Judgment normal.    Labs reviewed: Recent Labs    01/23/19 0000 07/05/19 1206  NA 141 139  K 4.2 4.4  CL 106 105  CO2 29* 27  GLUCOSE  --  96  BUN 17 14  CREATININE 0.9 0.90  CALCIUM 9.4 9.9   Recent Labs    01/23/19 0000 07/05/19 1206  AST 25 22  ALT 27 20  BILITOT  --  0.4  PROT  --  6.8  ALBUMIN 3.9  --    Recent Labs    01/23/19 0000 07/05/19 1206  WBC 7.8 5.5  NEUTROABS  --  2,833  HGB 11.8* 11.1*  HCT 37 35.0  MCV  --  77.6*  PLT 324 351   Lab Results  Component Value Date   TSH 1.46 07/05/2019   Lab Results  Component Value Date   HGBA1C 6.1 (H) 07/05/2019   Lab Results  Component Value Date   CHOL 173 07/05/2019   HDL 71 07/05/2019   LDLCALC 83  07/05/2019   TRIG 101 07/05/2019   CHOLHDL 2.4 07/05/2019    Significant Diagnostic Results in last 30 days:  No results found.  Assessment/Plan 1. Cellulitis of back except buttock Afebrile.Left flank areas 6 cm x 4 cm erythema with small pinpoint hard center non-tender,warm  to touch. No drainage noted.  - doxycycline (VIBRA-TABS) 100 MG tablet; Take 1 tablet (100 mg total) by mouth 2 (two) times daily for 7 days.  Dispense: 14 tablet; Refill: 0 - saccharomyces boulardii (FLORASTOR) 250 MG capsule; Take 1 capsule (250 mg total) by mouth 2 (two) times daily for 10 days.  Dispense: 20 capsule; Refill: 0  2. Insect bite, unspecified site, initial encounter Left flank areas 6 cm x 4 cm erythema with small pinpoint hard center non-tender,warm  to touch. No drainage noted. Will treat with antibiotics as above. - Additional bed bug information provided on AVS.   Family/ staff Communication: Reviewed plan of care with patient verbalized understanding.   Labs/tests ordered: None   Next Appointment: As needed if symptoms worsen or failed to resolve.  Sandrea Hughs, NP

## 2019-11-05 NOTE — Patient Instructions (Signed)
Bedbugs  Bedbugs are tiny bugs that live in and around beds. During the day, they stay hidden. At night, they come out and bite. Where are bedbugs found? Bedbugs can be found anywhere. It does not matter if a place is clean or dirty. Bedbugs are found in:  Hotels.  Shelters.  Dorms.  Hospitals.  Nursing homes.  Places where there are many birds or bats. What are bedbug bites like?  A bedbug bite makes a small red bump with a darker red dot in the middle. The bump may show soon after you are bitten or one or more days later. Bedbug bites usually do not hurt, but they may itch. Most people do not need treatment for bedbug bites. The bumps usually go away on their own in a few days. If you have a lot of bedbug bites and they feel very itchy:  Do not scratch the bite areas.  You may put one of these on the bite area as told by your doctor: ? Baking soda paste. Make this by adding water to baking soda. ? Cortisone cream. ? Calamine lotion. How do I check for bedbugs? Adult bedbugs are reddish-brown, oval, and flat. They are very small, and they cannot fly. Young bedbugs are whitish-yellow and are smaller than the adult bedbugs. Use a flashlight to look for bedbugs in these places:  On mattresses, bed frames, headboards, and box springs.  On drapes and curtains in bedrooms.  Under the carpet in bedrooms.  Behind electrical outlets.  Behind any wallpaper that is peeling.  Inside luggage. Also look for black or red spots or stains on or near the bed. What should I do if I find bedbugs? When traveling Check your clothes, suitcase, and belongings for bedbugs before you go back home. You may want to throw away anything that has bedbugs on it. At home Your bedroom may need to be treated by a pest control expert. You may also need to throw away mattresses or luggage. To help stop bedbugs from coming back, you may want to:  Wash your clothes and bedding in water that is hotter  than 120F (48.9C). Dry them on a hot setting.  Put a plastic cover over your mattress.  When you sleep, wear pajamas that have long sleeves and pant legs. Bedbugs usually bite skin that is not covered.  Vacuum often around the bed and in all of the cracks where the bugs might hide.  Check all used furniture, bedding, or clothes that you bring into your home.  Get rid of bird nests and bat roosts that are near your home. Where to find more information  U.S. Environmental Protection Agency (EPA): www.epa.gov/bedbugs Summary  Bedbugs are tiny bugs that live in and around beds.  Bedbugs are often found in hotels, shelters, dorms, hospitals, and nursing homes.  A bedbug bite makes a small red bump with a darker red dot in the middle.  Bedbug bites usually do not hurt, but they may itch. Most people do not need treatment for the bites.  If you find bedbugs at home, your bedroom may need to be treated by a pest control expert. This information is not intended to replace advice given to you by your health care provider. Make sure you discuss any questions you have with your health care provider. Document Revised: 05/05/2017 Document Reviewed: 01/13/2017 Elsevier Patient Education  2020 Elsevier Inc.  

## 2019-11-05 NOTE — Telephone Encounter (Signed)
Victoria Hudson came in today with her husband and wanted to apologize to [name] that she may have had bed bugs, well on last Thursday she found a host of bed bugs on her husbands side of the bed, she is trying to get and appointment with  [name] today because she has a spot on her back that is not healing. She states she is so sorry because back in the day if you were told you had bed bugs it meant you were nasty, and they are not nasty.

## 2019-11-05 NOTE — Telephone Encounter (Signed)
Did she already look into the treatment process for their home (heat treatment and spraying)?  I wonder where they picked them up--she'll need to check into guests they've had or if they've stayed other places themselves and brought them home.  Typically all clothing and bedding, towels, etc must be washed in hot water.  Sometimes, new furniture is needed.  As far as treatment, it's primarily dealing with the itching with steroids and topical benadryl.  I'm surprised that the biopsy did not indicate that from dermatology.

## 2019-11-05 NOTE — Telephone Encounter (Signed)
I agree with Dr.Reed's plan.

## 2019-11-06 NOTE — Telephone Encounter (Signed)
Yes she is actively cleaning and said that if she see more she will have a company to come out and clean her house.

## 2019-11-07 ENCOUNTER — Encounter: Payer: Self-pay | Admitting: Internal Medicine

## 2019-11-07 ENCOUNTER — Other Ambulatory Visit: Payer: Self-pay

## 2019-11-07 ENCOUNTER — Encounter (HOSPITAL_BASED_OUTPATIENT_CLINIC_OR_DEPARTMENT_OTHER): Payer: Self-pay | Admitting: General Surgery

## 2019-11-12 ENCOUNTER — Other Ambulatory Visit (HOSPITAL_COMMUNITY): Payer: Medicare HMO

## 2019-11-13 ENCOUNTER — Other Ambulatory Visit (HOSPITAL_COMMUNITY)
Admission: RE | Admit: 2019-11-13 | Discharge: 2019-11-13 | Disposition: A | Discharge status: Home / Self Care | Payer: Medicare HMO | Source: Ambulatory Visit | Attending: General Surgery | Admitting: General Surgery

## 2019-11-13 ENCOUNTER — Other Ambulatory Visit: Payer: Self-pay | Admitting: Internal Medicine

## 2019-11-13 DIAGNOSIS — Z01812 Encounter for preprocedural laboratory examination: Secondary | ICD-10-CM | POA: Diagnosis not present

## 2019-11-13 DIAGNOSIS — Z20822 Contact with and (suspected) exposure to covid-19: Secondary | ICD-10-CM | POA: Insufficient documentation

## 2019-11-13 LAB — SARS CORONAVIRUS 2 (TAT 6-24 HRS): SARS Coronavirus 2: NEGATIVE

## 2019-11-14 ENCOUNTER — Other Ambulatory Visit: Payer: Self-pay

## 2019-11-14 ENCOUNTER — Ambulatory Visit
Admission: RE | Admit: 2019-11-14 | Discharge: 2019-11-14 | Disposition: A | Discharge status: Home / Self Care | Payer: Medicare HMO | Source: Ambulatory Visit | Attending: General Surgery | Admitting: General Surgery

## 2019-11-14 DIAGNOSIS — N6311 Unspecified lump in the right breast, upper outer quadrant: Secondary | ICD-10-CM | POA: Diagnosis not present

## 2019-11-14 DIAGNOSIS — N6310 Unspecified lump in the right breast, unspecified quadrant: Secondary | ICD-10-CM

## 2019-11-14 NOTE — Telephone Encounter (Signed)
rx sent to pharmacy by e-script  

## 2019-11-14 NOTE — Progress Notes (Signed)

## 2019-11-15 ENCOUNTER — Other Ambulatory Visit: Payer: Self-pay

## 2019-11-15 ENCOUNTER — Ambulatory Visit
Admission: RE | Admit: 2019-11-15 | Discharge: 2019-11-15 | Disposition: A | Discharge status: Home / Self Care | Payer: Medicare HMO | Source: Ambulatory Visit | Attending: General Surgery | Admitting: General Surgery

## 2019-11-15 ENCOUNTER — Encounter (HOSPITAL_BASED_OUTPATIENT_CLINIC_OR_DEPARTMENT_OTHER)
Admission: RE | Disposition: A | Discharge status: Home / Self Care | Payer: Self-pay | Source: Home / Self Care | Attending: General Surgery

## 2019-11-15 ENCOUNTER — Ambulatory Visit (HOSPITAL_BASED_OUTPATIENT_CLINIC_OR_DEPARTMENT_OTHER): Payer: Medicare HMO | Admitting: Anesthesiology

## 2019-11-15 ENCOUNTER — Ambulatory Visit (HOSPITAL_BASED_OUTPATIENT_CLINIC_OR_DEPARTMENT_OTHER)
Admission: RE | Admit: 2019-11-15 | Discharge: 2019-11-15 | Disposition: A | Discharge status: Home / Self Care | Payer: Medicare HMO | Attending: General Surgery | Admitting: General Surgery

## 2019-11-15 DIAGNOSIS — Z170 Estrogen receptor positive status [ER+]: Secondary | ICD-10-CM | POA: Diagnosis not present

## 2019-11-15 DIAGNOSIS — Z8349 Family history of other endocrine, nutritional and metabolic diseases: Secondary | ICD-10-CM | POA: Insufficient documentation

## 2019-11-15 DIAGNOSIS — Z8249 Family history of ischemic heart disease and other diseases of the circulatory system: Secondary | ICD-10-CM | POA: Insufficient documentation

## 2019-11-15 DIAGNOSIS — K219 Gastro-esophageal reflux disease without esophagitis: Secondary | ICD-10-CM | POA: Insufficient documentation

## 2019-11-15 DIAGNOSIS — Z79890 Hormone replacement therapy (postmenopausal): Secondary | ICD-10-CM | POA: Diagnosis not present

## 2019-11-15 DIAGNOSIS — Z79899 Other long term (current) drug therapy: Secondary | ICD-10-CM | POA: Diagnosis not present

## 2019-11-15 DIAGNOSIS — C50911 Malignant neoplasm of unspecified site of right female breast: Secondary | ICD-10-CM | POA: Diagnosis not present

## 2019-11-15 DIAGNOSIS — N6310 Unspecified lump in the right breast, unspecified quadrant: Secondary | ICD-10-CM

## 2019-11-15 DIAGNOSIS — N6311 Unspecified lump in the right breast, upper outer quadrant: Secondary | ICD-10-CM | POA: Diagnosis not present

## 2019-11-15 DIAGNOSIS — C50411 Malignant neoplasm of upper-outer quadrant of right female breast: Secondary | ICD-10-CM | POA: Diagnosis not present

## 2019-11-15 DIAGNOSIS — Z886 Allergy status to analgesic agent status: Secondary | ICD-10-CM | POA: Insufficient documentation

## 2019-11-15 DIAGNOSIS — J45909 Unspecified asthma, uncomplicated: Secondary | ICD-10-CM | POA: Diagnosis not present

## 2019-11-15 DIAGNOSIS — I10 Essential (primary) hypertension: Secondary | ICD-10-CM | POA: Diagnosis not present

## 2019-11-15 DIAGNOSIS — Z803 Family history of malignant neoplasm of breast: Secondary | ICD-10-CM | POA: Insufficient documentation

## 2019-11-15 DIAGNOSIS — K449 Diaphragmatic hernia without obstruction or gangrene: Secondary | ICD-10-CM | POA: Insufficient documentation

## 2019-11-15 DIAGNOSIS — E039 Hypothyroidism, unspecified: Secondary | ICD-10-CM | POA: Diagnosis not present

## 2019-11-15 DIAGNOSIS — M797 Fibromyalgia: Secondary | ICD-10-CM | POA: Diagnosis not present

## 2019-11-15 HISTORY — PX: BREAST LUMPECTOMY WITH RADIOACTIVE SEED LOCALIZATION: SHX6424

## 2019-11-15 SURGERY — BREAST LUMPECTOMY WITH RADIOACTIVE SEED LOCALIZATION
Anesthesia: General | Site: Breast | Laterality: Right

## 2019-11-15 MED ORDER — FENTANYL CITRATE (PF) 100 MCG/2ML IJ SOLN
INTRAMUSCULAR | Status: AC
  Filled 2019-11-15: qty 2

## 2019-11-15 MED ORDER — LIDOCAINE HCL (PF) 1 % IJ SOLN
INTRAMUSCULAR | Status: AC
  Filled 2019-11-15: qty 30

## 2019-11-15 MED ORDER — LIDOCAINE-EPINEPHRINE 1 %-1:100000 IJ SOLN
INTRAMUSCULAR | Status: AC
  Filled 2019-11-15: qty 2

## 2019-11-15 MED ORDER — OXYMETAZOLINE HCL 0.05 % NA SOLN
NASAL | Status: AC
  Filled 2019-11-15: qty 60

## 2019-11-15 MED ORDER — TRAMADOL HCL 50 MG PO TABS
50.0000 mg | ORAL_TABLET | Freq: Four times a day (QID) | ORAL | 0 refills | Status: DC | PRN
Start: 2019-11-15 — End: 2020-03-02

## 2019-11-15 MED ORDER — DEXAMETHASONE SODIUM PHOSPHATE 10 MG/ML IJ SOLN
INTRAMUSCULAR | Status: AC
  Filled 2019-11-15: qty 1

## 2019-11-15 MED ORDER — PHENYLEPHRINE 40 MCG/ML (10ML) SYRINGE FOR IV PUSH (FOR BLOOD PRESSURE SUPPORT)
PREFILLED_SYRINGE | INTRAVENOUS | Status: AC
  Filled 2019-11-15: qty 10

## 2019-11-15 MED ORDER — ONDANSETRON HCL 4 MG/2ML IJ SOLN
INTRAMUSCULAR | Status: AC
  Filled 2019-11-15: qty 2

## 2019-11-15 MED ORDER — FENTANYL CITRATE (PF) 100 MCG/2ML IJ SOLN
INTRAMUSCULAR | Status: DC | PRN
  Administered 2019-11-15: 50 ug via INTRAVENOUS

## 2019-11-15 MED ORDER — ACETAMINOPHEN 500 MG PO TABS
1000.0000 mg | ORAL_TABLET | ORAL | Status: AC
  Administered 2019-11-15: 1000 mg via ORAL

## 2019-11-15 MED ORDER — MUPIROCIN CALCIUM 2 % EX CREA
TOPICAL_CREAM | CUTANEOUS | Status: AC
  Filled 2019-11-15: qty 30

## 2019-11-15 MED ORDER — LIDOCAINE 2% (20 MG/ML) 5 ML SYRINGE
INTRAMUSCULAR | Status: AC
  Filled 2019-11-15: qty 5

## 2019-11-15 MED ORDER — CHLORHEXIDINE GLUCONATE CLOTH 2 % EX PADS: 6.0000 | MEDICATED_PAD | Freq: Once | CUTANEOUS | Status: DC

## 2019-11-15 MED ORDER — PROPOFOL 10 MG/ML IV BOLUS
INTRAVENOUS | Status: DC | PRN
  Administered 2019-11-15: 150 mg via INTRAVENOUS

## 2019-11-15 MED ORDER — CEFAZOLIN SODIUM-DEXTROSE 2-4 GM/100ML-% IV SOLN
INTRAVENOUS | Status: AC
  Filled 2019-11-15: qty 100

## 2019-11-15 MED ORDER — BUPIVACAINE HCL (PF) 0.25 % IJ SOLN
INTRAMUSCULAR | Status: AC
  Filled 2019-11-15: qty 30

## 2019-11-15 MED ORDER — ACETAMINOPHEN 500 MG PO TABS
ORAL_TABLET | ORAL | Status: AC
  Filled 2019-11-15: qty 2

## 2019-11-15 MED ORDER — ONDANSETRON HCL 4 MG/2ML IJ SOLN: 4.0000 mg | Freq: Once | INTRAMUSCULAR | Status: DC | PRN

## 2019-11-15 MED ORDER — ONDANSETRON HCL 4 MG/2ML IJ SOLN
INTRAMUSCULAR | Status: DC | PRN
  Administered 2019-11-15: 4 mg via INTRAVENOUS

## 2019-11-15 MED ORDER — PHENYLEPHRINE 40 MCG/ML (10ML) SYRINGE FOR IV PUSH (FOR BLOOD PRESSURE SUPPORT)
PREFILLED_SYRINGE | INTRAVENOUS | Status: DC | PRN
  Administered 2019-11-15: 80 ug via INTRAVENOUS

## 2019-11-15 MED ORDER — LIDOCAINE HCL (CARDIAC) PF 100 MG/5ML IV SOSY
PREFILLED_SYRINGE | INTRAVENOUS | Status: DC | PRN
  Administered 2019-11-15: 100 mg via INTRAVENOUS

## 2019-11-15 MED ORDER — PROPOFOL 10 MG/ML IV BOLUS
INTRAVENOUS | Status: AC
  Filled 2019-11-15: qty 20

## 2019-11-15 MED ORDER — GABAPENTIN 300 MG PO CAPS
ORAL_CAPSULE | ORAL | Status: AC
  Filled 2019-11-15: qty 1

## 2019-11-15 MED ORDER — DEXAMETHASONE SODIUM PHOSPHATE 4 MG/ML IJ SOLN
INTRAMUSCULAR | Status: DC | PRN
  Administered 2019-11-15: 5 mg via INTRAVENOUS

## 2019-11-15 MED ORDER — FENTANYL CITRATE (PF) 100 MCG/2ML IJ SOLN
25.0000 ug | INTRAMUSCULAR | Status: DC | PRN
  Administered 2019-11-15: 50 ug via INTRAVENOUS
  Administered 2019-11-15: 25 ug via INTRAVENOUS

## 2019-11-15 MED ORDER — BUPIVACAINE HCL (PF) 0.25 % IJ SOLN
INTRAMUSCULAR | Status: DC | PRN
  Administered 2019-11-15: 20 mL

## 2019-11-15 MED ORDER — CEFAZOLIN SODIUM-DEXTROSE 2-4 GM/100ML-% IV SOLN
2.0000 g | INTRAVENOUS | Status: AC
  Administered 2019-11-15: 2 g via INTRAVENOUS

## 2019-11-15 MED ORDER — LACTATED RINGERS IV SOLN: INTRAVENOUS | Status: DC

## 2019-11-15 MED ORDER — GABAPENTIN 300 MG PO CAPS
300.0000 mg | ORAL_CAPSULE | ORAL | Status: AC
  Administered 2019-11-15: 300 mg via ORAL

## 2019-11-15 SURGICAL SUPPLY — 45 items
APPLIER CLIP 9.375 MED OPEN (MISCELLANEOUS) ×2
BLADE SURG 15 STRL LF DISP TIS (BLADE) ×1 IMPLANT
BLADE SURG 15 STRL SS (BLADE) ×2
CANISTER SUC SOCK COL 7IN (MISCELLANEOUS) ×2 IMPLANT
CANISTER SUCT 1200ML W/VALVE (MISCELLANEOUS) ×2 IMPLANT
CHLORAPREP W/TINT 26 (MISCELLANEOUS) ×2 IMPLANT
CLIP APPLIE 9.375 MED OPEN (MISCELLANEOUS) ×1 IMPLANT
COVER BACK TABLE 60X90IN (DRAPES) ×2 IMPLANT
COVER MAYO STAND STRL (DRAPES) ×2 IMPLANT
COVER PROBE W GEL 5X96 (DRAPES) ×2 IMPLANT
COVER WAND RF STERILE (DRAPES) IMPLANT
DECANTER SPIKE VIAL GLASS SM (MISCELLANEOUS) IMPLANT
DERMABOND ADVANCED (GAUZE/BANDAGES/DRESSINGS) ×1
DERMABOND ADVANCED .7 DNX12 (GAUZE/BANDAGES/DRESSINGS) ×1 IMPLANT
DRAPE LAPAROSCOPIC ABDOMINAL (DRAPES) ×2 IMPLANT
DRAPE UTILITY XL STRL (DRAPES) ×2 IMPLANT
ELECT COATED BLADE 2.86 ST (ELECTRODE) ×2 IMPLANT
ELECT REM PT RETURN 9FT ADLT (ELECTROSURGICAL) ×2
ELECTRODE REM PT RTRN 9FT ADLT (ELECTROSURGICAL) ×1 IMPLANT
GLOVE BIO SURGEON STRL SZ 6.5 (GLOVE) ×2 IMPLANT
GLOVE BIO SURGEON STRL SZ7.5 (GLOVE) ×4 IMPLANT
GLOVE BIOGEL PI IND STRL 6.5 (GLOVE) ×1 IMPLANT
GLOVE BIOGEL PI IND STRL 7.0 (GLOVE) ×1 IMPLANT
GLOVE BIOGEL PI INDICATOR 6.5 (GLOVE) ×1
GLOVE BIOGEL PI INDICATOR 7.0 (GLOVE) ×1
GLOVE SURG SS PI 7.0 STRL IVOR (GLOVE) ×2 IMPLANT
GOWN STRL REUS W/ TWL LRG LVL3 (GOWN DISPOSABLE) ×3 IMPLANT
GOWN STRL REUS W/TWL LRG LVL3 (GOWN DISPOSABLE) ×6
ILLUMINATOR WAVEGUIDE N/F (MISCELLANEOUS) IMPLANT
KIT MARKER MARGIN INK (KITS) ×2 IMPLANT
LIGHT WAVEGUIDE WIDE FLAT (MISCELLANEOUS) IMPLANT
NEEDLE HYPO 25X1 1.5 SAFETY (NEEDLE) ×2 IMPLANT
NS IRRIG 1000ML POUR BTL (IV SOLUTION) ×2 IMPLANT
PENCIL SMOKE EVACUATOR (MISCELLANEOUS) ×2 IMPLANT
SET BASIN DAY SURGERY F.S. (CUSTOM PROCEDURE TRAY) ×2 IMPLANT
SLEEVE SCD COMPRESS KNEE MED (MISCELLANEOUS) ×2 IMPLANT
SPONGE LAP 18X18 RF (DISPOSABLE) ×2 IMPLANT
SUT MON AB 4-0 PC3 18 (SUTURE) ×2 IMPLANT
SUT SILK 2 0 SH (SUTURE) IMPLANT
SUT VICRYL 3-0 CR8 SH (SUTURE) ×2 IMPLANT
SYR CONTROL 10ML LL (SYRINGE) ×2 IMPLANT
TOWEL GREEN STERILE FF (TOWEL DISPOSABLE) ×2 IMPLANT
TRAY FAXITRON CT DISP (TRAY / TRAY PROCEDURE) ×2 IMPLANT
TUBE CONNECTING 20X1/4 (TUBING) ×2 IMPLANT
YANKAUER SUCT BULB TIP NO VENT (SUCTIONS) ×2 IMPLANT

## 2019-11-15 NOTE — H&P (Signed)
Victoria Hudson  Location: [facility] Patient #: [id number] DOB: [birth year] Married / Language: English / Race: White Female   History of Present Illness The patient is a 74 year old female who presents with a breast mass. We are asked to see the patient in consultation by Dr. Hollace Kinnier to evaluate her for a right breast mass. The patient is a 74 year old white female who first was found to have this mass in the upper outer quadrant of the right breast in August of last year. This was biopsied at [facility] and came back benign. She did not come back for follow-up because of a rash. She then decided on a second opinion at the breast center. The mass was similar sized and appearing on the recent imaging. It was biopsied again and came back benign but the results were felt to be discordant because of its spiculated appearance and the recommendation is to have. She does not smoke. She does have a family history of breast cancer in sister.   Past Surgical History Appendectomy  Breast Biopsy  Bilateral. multiple Colon Polyp Removal - Colonoscopy  Foot Surgery  Left. Gallbladder Surgery - Laparoscopic  Hysterectomy (not due to cancer) - Partial  Knee Surgery  Right. Oral Surgery  Spinal Surgery - Lower Back  Spinal Surgery - Neck   Allergies  Sulfur  Itching, Swelling. Aspirin *ANALGESICS - NonNarcotic*  Burning Allergies Reconciled   Medication History  Cyclobenzaprine HCl (10MG  Tablet, Oral) Active. Levothyroxine Sodium (50MCG Tablet, Oral) Active. Levocetirizine Dihydrochloride (5MG  Tablet, Oral) Active. Metoprolol Succinate ER (50MG  Tablet ER 24HR, Oral) Active. Losartan Potassium (50MG  Tablet, Oral) Active. Estazolam (2MG  Tablet, Oral) Active. Pantoprazole Sodium (40MG  Tablet DR, Oral) Active. Cyanocobalamin (2500MCG Tab Sublingual, Sublingual) Active. Cholecalciferol (50 MCG(2000 UT) Tablet, Oral) Active. Albuterol (90MCG/ACT  Aerosol Soln, Inhalation) Active. Magnesium (Oral) Specific strength unknown - Active. Medications Reconciled  Social History Alcohol use  Occasional alcohol use. Caffeine use  Coffee. No drug use  Tobacco use  Never smoker.  Family History  Alcohol Abuse  Father, Sister. Arthritis  Family Members In General. Breast Cancer  Sister. Cerebrovascular Accident  Mother, Sister. Cervical Cancer  Mother. Colon Cancer  Sister. Colon Polyps  Family Members In General. Diabetes Mellitus  Sister. Heart Disease  Brother, Father, Mother, Sister. Heart disease in female family member before age 76  Heart disease in female family member before age 27  Hypertension  Brother, Daughter, Father, Mother, Sister. Ischemic Bowel Disease  Family Members In General. Migraine Headache  Sister. Respiratory Condition  Sister. Seizure disorder  Daughter, Sister. Thyroid problems  Daughter, Family Members In General.    Review of Systems  General Not Present- Appetite Loss, Chills, Fatigue, Fever, Night Sweats, Weight Gain and Weight Loss. Skin Present- Hives. Not Present- Change in Wart/Mole, Dryness, Jaundice, New Lesions, Non-Healing Wounds, Rash and Ulcer. HEENT Present- Wears glasses/contact lenses. Not Present- Earache, Hearing Loss, Hoarseness, Nose Bleed, Oral Ulcers, Ringing in the Ears, Seasonal Allergies, Sinus Pain, Sore Throat, Visual Disturbances and Yellow Eyes. Respiratory Not Present- Bloody sputum, Chronic Cough, Difficulty Breathing, Snoring and Wheezing. Breast Present- Breast Mass and Breast Pain. Not Present- Nipple Discharge and Skin Changes. Cardiovascular Not Present- Chest Pain, Difficulty Breathing Lying Down, Leg Cramps, Palpitations, Rapid Heart Rate, Shortness of Breath and Swelling of Extremities. Gastrointestinal Not Present- Abdominal Pain, Bloating, Bloody Stool, Change in Bowel Habits, Chronic diarrhea, Constipation, Difficulty Swallowing, Excessive  gas, Gets full quickly at meals, Hemorrhoids, Indigestion, Nausea, Rectal Pain and Vomiting. Female  Genitourinary Not Present- Frequency, Nocturia, Painful Urination, Pelvic Pain and Urgency. Musculoskeletal Not Present- Back Pain, Joint Pain, Joint Stiffness, Muscle Pain, Muscle Weakness and Swelling of Extremities. Neurological Not Present- Decreased Memory, Fainting, Headaches, Numbness, Seizures, Tingling, Tremor, Trouble walking and Weakness. Psychiatric Present- Depression. Not Present- Anxiety, Bipolar, Change in Sleep Pattern, Fearful and Frequent crying. Endocrine Not Present- Cold Intolerance, Excessive Hunger, Hair Changes, Heat Intolerance, Hot flashes and New Diabetes. Hematology Not Present- Blood Thinners, Easy Bruising, Excessive bleeding, Gland problems, HIV and Persistent Infections.  Vitals Weight: 171.8 lb Height: 62in Body Surface Area: 1.79 m Body Mass Index: 31.42 kg/m  Temp.: 97.67F (Temporal)  Pulse: 76 (Regular)  BP: 118/74(Sitting, Left Arm, Standard)       Physical Exam  General Mental Status-Alert. General Appearance-Consistent with stated age. Hydration-Well hydrated. Voice-Normal.  Head and Neck Head-normocephalic, atraumatic with no lesions or palpable masses. Trachea-midline. Thyroid Gland Characteristics - normal size and consistency.  Eye Eyeball - Bilateral-Extraocular movements intact. Sclera/Conjunctiva - Bilateral-No scleral icterus.  Chest and Lung Exam Chest and lung exam reveals -quiet, even and easy respiratory effort with no use of accessory muscles and on auscultation, normal breath sounds, no adventitious sounds and normal vocal resonance. Inspection Chest Wall - Normal. Back - normal.  Breast Note: There is a palpable firmness in the upper outer quadrant of the right breast. Other than this there is no palpable mass in either breast. There is no palpable axillary, supraclavicular, or cervical  lymphadenopathy.   Cardiovascular Cardiovascular examination reveals -normal heart sounds, regular rate and rhythm with no murmurs and normal pedal pulses bilaterally.  Abdomen Inspection Inspection of the abdomen reveals - No Hernias. Skin - Scar - no surgical scars. Palpation/Percussion Palpation and Percussion of the abdomen reveal - Soft, Non Tender, No Rebound tenderness, No Rigidity (guarding) and No hepatosplenomegaly. Auscultation Auscultation of the abdomen reveals - Bowel sounds normal.  Neurologic Neurologic evaluation reveals -alert and oriented x 3 with no impairment of recent or remote memory. Mental Status-Normal.  Musculoskeletal Normal Exam - Left-Upper Extremity Strength Normal and Lower Extremity Strength Normal. Normal Exam - Right-Upper Extremity Strength Normal and Lower Extremity Strength Normal.  Lymphatic Head & Neck  General Head & Neck Lymphatics: Bilateral - Description - Normal. Axillary  General Axillary Region: Bilateral - Description - Normal. Tenderness - Non Tender. Femoral & Inguinal  Generalized Femoral & Inguinal Lymphatics: Bilateral - Description - Normal. Tenderness - Non Tender.    Assessment & Plan  BREAST MASS, RIGHT (N63.10) Impression: The patient appears to have a 7 mm spiculated mass in the upper outer quadrant of the right breast near the chest wall. This has been biopsied twice and been benign but the results are felt to be discordant because of the spiculated appearance of the mass. Because of this the recommendation is to have this area removed and I think this is a reasonable thing to do. I have discussed with her in detail the risks and benefits of the operation as well as some of the technical aspects including the use of a radioactive seed for localization and she understands and wishes to proceed. This patient encounter took 45 minutes today to perform the following: take history, perform exam, review outside  records, interpret imaging, counsel the patient on their diagnosis and document encounter, findings & plan in the EHR

## 2019-11-15 NOTE — Anesthesia Procedure Notes (Signed)
Procedure Name: LMA Insertion Performed by: Vonetta Foulk, [place], CRNA Pre-anesthesia Checklist: Patient identified, Emergency Drugs available, Suction available and Patient being monitored Patient Re-evaluated:Patient Re-evaluated prior to induction Oxygen Delivery Method: Circle system utilized Preoxygenation: Pre-oxygenation with 100% oxygen Induction Type: IV induction Ventilation: Mask ventilation without difficulty LMA: LMA inserted LMA Size: 4.0 Number of attempts: 1 Airway Equipment and Method: Bite block Placement Confirmation: positive ETCO2 Tube secured with: Tape Dental Injury: Teeth and Oropharynx as per pre-operative assessment        

## 2019-11-15 NOTE — Anesthesia Preprocedure Evaluation (Addendum)
Anesthesia Evaluation  Patient identified by MRN, date of birth, ID band Patient awake    Reviewed: Allergy & Precautions, NPO status , Patient's Chart, lab work & pertinent test results  History of Anesthesia Complications (+) PONV and history of anesthetic complications  Airway Mallampati: III  TM Distance: >3 FB Neck ROM: Full    Dental no notable dental hx.    Pulmonary asthma ,    Pulmonary exam normal breath sounds clear to auscultation       Cardiovascular hypertension, Pt. on home beta blockers Normal cardiovascular exam Rhythm:Regular Rate:Normal  ECG: SR, rate 64   Neuro/Psych PSYCHIATRIC DISORDERS Depression negative neurological ROS     GI/Hepatic Neg liver ROS, hiatal hernia, GERD  Medicated and Controlled,  Endo/Other  Hypothyroidism   Renal/GU negative Renal ROS     Musculoskeletal  (+) Arthritis , Fibromyalgia -Chronic back pain   Abdominal (+) + obese,   Peds  Hematology negative hematology ROS (+)   Anesthesia Other Findings RIGHT BREAST MASS  Reproductive/Obstetrics                            Anesthesia Physical Anesthesia Plan  ASA: III  Anesthesia Plan: General   Post-op Pain Management:    Induction: Intravenous  PONV Risk Score and Plan: 4 or greater and Ondansetron, Dexamethasone and Treatment may vary due to age or medical condition  Airway Management Planned: LMA  Additional Equipment:   Intra-op Plan:   Post-operative Plan: Extubation in OR  Informed Consent: I have reviewed the patients History and Physical, chart, labs and discussed the procedure including the risks, benefits and alternatives for the proposed anesthesia with the patient or authorized representative who has indicated his/her understanding and acceptance.     Dental advisory given  Plan Discussed with: CRNA  Anesthesia Plan Comments:        Anesthesia Quick  Evaluation

## 2019-11-15 NOTE — Transfer of Care (Signed)
Immediate Anesthesia Transfer of Care Note  Patient: Victoria Hudson  Procedure(s) Performed: RIGHT BREAST LUMPECTOMY WITH RADIOACTIVE SEED LOCALIZATION (Right Breast)  Patient Location: PACU  Anesthesia Type:General  Level of Consciousness: sedated  Airway & Oxygen Therapy: Patient Spontanous Breathing and Patient connected to face mask oxygen  Post-op Assessment: Report given to RN and Post -op Vital signs reviewed and stable  Post vital signs: Reviewed and stable  Last Vitals:  Vitals Value Taken Time  BP 126/62 11/15/19 1046  Temp    Pulse 69 11/15/19 1047  Resp 17 11/15/19 1047  SpO2 99 % 11/15/19 1047  Vitals shown include unvalidated device data.  Last Pain:  Vitals:   11/15/19 0916  TempSrc: Oral  PainSc: 0-No pain         Complications: No complications documented.

## 2019-11-15 NOTE — Op Note (Signed)
11/15/2019  10:41 AM  PATIENT:  Victoria Hudson  74 y.o. female  PRE-OPERATIVE DIAGNOSIS:  RIGHT BREAST MASS  POST-OPERATIVE DIAGNOSIS:  RIGHT BREAST MASS  PROCEDURE:  Procedure(s): RIGHT BREAST LUMPECTOMY WITH RADIOACTIVE SEED LOCALIZATION (Right)  SURGEON:  Surgeon(s) and Role:    * Jovita Kussmaul, MD - Primary  PHYSICIAN ASSISTANT:   ASSISTANTS: none   ANESTHESIA:   local and general  EBL:  10 mL   BLOOD ADMINISTERED:none  DRAINS: none   LOCAL MEDICATIONS USED:  MARCAINE     SPECIMEN:  Source of Specimen:  right breast tissue with additional medial margin  DISPOSITION OF SPECIMEN:  PATHOLOGY  COUNTS:  YES  TOURNIQUET:  * No tourniquets in log *  DICTATION: .Dragon Dictation   After informed consent was obtained the patient was brought to the operating room and placed in the supine position on the operating table.  After adequate induction of general anesthesia the patient's right breast was prepped with ChloraPrep, allowed to dry, and draped in usual sterile manner.  An appropriate timeout was performed.  Previously an I-125 seed was placed in the upper outer quadrant of the right breast to mark an area of discordance.  This area was biopsied twice previously and was benign.  The neoprobe was set to I-125 in the area of radioactivity was readily identified.  The area around this was infiltrated with quarter percent Marcaine.  A curvilinear incision was made along the upper outer edge of the right breast near the axilla with a 15 blade knife.  The incision was carried through the skin and subcutaneous tissue sharply with the electrocautery.  Dissection was then carried towards the radioactive seed under the direction of the neoprobe.  Once I more closely approached the radioactive seed I then removed a circular portion of breast tissue sharply with the electrocautery around the radioactive seed while checking the area of radioactivity frequently.  Once the specimen was  removed it was oriented with the appropriate paint colors.  A specimen radiograph was obtained that showed the seed and one of the 2 clips to be within the specimen.  The specimen was then sent to pathology for further evaluation.  I removed an additional medial margin and was able to find the second clip.  This was also marked appropriately and sent to pathology for further evaluation.  Hemostasis was achieved using the Bovie electrocautery.  The wound was irrigated with saline and infiltrated with more quarter percent Marcaine.  The deep layer of the wound was then closed with layers of interrupted 3-0 Vicryl stitches.  The skin was then closed with a running 4-0 Monocryl subcuticular stitch.  Dermabond dressings were applied.  The patient tolerated the procedure well.  At the end of the case all needle sponge and instrument counts were correct.  The patient was then awakened and taken to recovery in stable condition.  PLAN OF CARE: Discharge to home after PACU  PATIENT DISPOSITION:  PACU - hemodynamically stable.   Delay start of Pharmacological VTE agent (>24hrs) due to surgical blood loss or risk of bleeding: not applicable

## 2019-11-15 NOTE — Anesthesia Postprocedure Evaluation (Signed)
Anesthesia Post Note  Patient: Victoria Hudson  Procedure(s) Performed: RIGHT BREAST LUMPECTOMY WITH RADIOACTIVE SEED LOCALIZATION (Right Breast)     Patient location during evaluation: PACU Anesthesia Type: General Level of consciousness: sedated Pain management: pain level controlled Vital Signs Assessment: post-procedure vital signs reviewed and stable Respiratory status: spontaneous breathing and respiratory function stable Cardiovascular status: stable Postop Assessment: no apparent nausea or vomiting Anesthetic complications: no   No complications documented.  Last Vitals:  Vitals:   11/15/19 1146 11/15/19 1235  BP:  (!) 162/59  Pulse: 63 65  Resp: 14 18  Temp:  36.5 C  SpO2: 97% 100%    Last Pain:  Vitals:   11/15/19 1235  TempSrc:   PainSc: 4                  Saidi Santacroce DANIEL

## 2019-11-15 NOTE — Interval H&P Note (Signed)
History and Physical Interval Note:  11/15/2019 9:10 AM  Victoria Hudson  has presented today for surgery, with the diagnosis of RIGHT BREAST MASS.  The various methods of treatment have been discussed with the patient and family. After consideration of risks, benefits and other options for treatment, the patient has consented to  Procedure(s): RIGHT BREAST LUMPECTOMY WITH RADIOACTIVE SEED LOCALIZATION (Right) as a surgical intervention.  The patient's history has been reviewed, patient examined, no change in status, stable for surgery.  I have reviewed the patient's chart and labs.  Questions were answered to the patient's satisfaction.     [name]

## 2019-11-15 NOTE — Discharge Instructions (Signed)
  Post Anesthesia Home Care Instructions  Activity: Get plenty of rest for the remainder of the day. A responsible individual must stay with you for 24 hours following the procedure.  For the next 24 hours, DO NOT: -Drive a car -Paediatric nurse -Drink alcoholic beverages -Take any medication unless instructed by your physician -Make any legal decisions or sign important papers.  Meals: Start with liquid foods such as gelatin or soup. Progress to regular foods as tolerated. Avoid greasy, spicy, heavy foods. If nausea and/or vomiting occur, drink only clear liquids until the nausea and/or vomiting subsides. Call your physician if vomiting continues.  Special Instructions/Symptoms: Your throat may feel dry or sore from the anesthesia or the breathing tube placed in your throat during surgery. If this causes discomfort, gargle with warm salt water. The discomfort should disappear within 24 hours.  If you had a scopolamine patch placed behind your ear for the management of post- operative nausea and/or vomiting:  1. The medication in the patch is effective for 72 hours, after which it should be removed.  Wrap patch in a tissue and discard in the trash. Wash hands thoroughly with soap and water. 2. You may remove the patch earlier than 72 hours if you experience unpleasant side effects which may include dry mouth, dizziness or visual disturbances. 3. Avoid touching the patch. Wash your hands with soap and water after contact with the patch.   No tylenol today until after 3pm if needed.

## 2019-11-18 ENCOUNTER — Encounter (HOSPITAL_BASED_OUTPATIENT_CLINIC_OR_DEPARTMENT_OTHER): Payer: Self-pay | Admitting: General Surgery

## 2019-11-20 LAB — SURGICAL PATHOLOGY

## 2019-12-04 ENCOUNTER — Encounter: Payer: Self-pay | Admitting: Adult Health

## 2019-12-04 DIAGNOSIS — Z170 Estrogen receptor positive status [ER+]: Secondary | ICD-10-CM | POA: Insufficient documentation

## 2019-12-04 DIAGNOSIS — C50411 Malignant neoplasm of upper-outer quadrant of right female breast: Secondary | ICD-10-CM | POA: Insufficient documentation

## 2019-12-10 ENCOUNTER — Telehealth: Payer: Self-pay | Admitting: *Deleted

## 2019-12-10 NOTE — Telephone Encounter (Signed)
Patient requested to cancel appointment with Dr. Lisbeth Renshaw per Phineas Real. Appointment cancelled.

## 2019-12-10 NOTE — Telephone Encounter (Signed)
Patient called and requested that her upcoming appointment with Dr. Lisbeth Renshaw be cancelled.  Scheduling department notified.  Will continue to follow as necessary.  Victoria Hudson. Leonie Green, BSN

## 2019-12-12 ENCOUNTER — Ambulatory Visit: Payer: Medicare HMO | Admitting: Radiation Oncology

## 2019-12-12 ENCOUNTER — Ambulatory Visit: Payer: Medicare HMO

## 2019-12-12 ENCOUNTER — Encounter: Payer: Self-pay | Admitting: *Deleted

## 2019-12-12 NOTE — Progress Notes (Signed)
Pt wishes to see med/rad onc with [facility] or [facility]

## 2019-12-17 ENCOUNTER — Other Ambulatory Visit: Payer: Self-pay | Admitting: *Deleted

## 2019-12-17 ENCOUNTER — Other Ambulatory Visit: Payer: Self-pay | Admitting: Internal Medicine

## 2019-12-17 DIAGNOSIS — C50411 Malignant neoplasm of upper-outer quadrant of right female breast: Secondary | ICD-10-CM | POA: Diagnosis not present

## 2019-12-17 DIAGNOSIS — Z170 Estrogen receptor positive status [ER+]: Secondary | ICD-10-CM | POA: Diagnosis not present

## 2019-12-17 DIAGNOSIS — I10 Essential (primary) hypertension: Secondary | ICD-10-CM

## 2019-12-17 DIAGNOSIS — C50911 Malignant neoplasm of unspecified site of right female breast: Secondary | ICD-10-CM | POA: Diagnosis not present

## 2019-12-17 DIAGNOSIS — Z510 Encounter for antineoplastic radiation therapy: Secondary | ICD-10-CM | POA: Diagnosis not present

## 2019-12-17 MED ORDER — METOPROLOL SUCCINATE ER 100 MG PO TB24: 100.0000 mg | ORAL_TABLET | Freq: Every day | ORAL | 1 refills | Status: DC

## 2019-12-17 NOTE — Telephone Encounter (Signed)
Pharmacy requested refill.  Pended Rx and sent to Dr. Mariea Clonts due to [place].

## 2019-12-19 DIAGNOSIS — C50411 Malignant neoplasm of upper-outer quadrant of right female breast: Secondary | ICD-10-CM | POA: Diagnosis not present

## 2019-12-19 DIAGNOSIS — Z170 Estrogen receptor positive status [ER+]: Secondary | ICD-10-CM | POA: Diagnosis not present

## 2020-01-03 DIAGNOSIS — Z170 Estrogen receptor positive status [ER+]: Secondary | ICD-10-CM | POA: Diagnosis not present

## 2020-01-03 DIAGNOSIS — C50411 Malignant neoplasm of upper-outer quadrant of right female breast: Secondary | ICD-10-CM | POA: Diagnosis not present

## 2020-01-08 DIAGNOSIS — Z780 Asymptomatic menopausal state: Secondary | ICD-10-CM | POA: Diagnosis not present

## 2020-01-08 DIAGNOSIS — Z79811 Long term (current) use of aromatase inhibitors: Secondary | ICD-10-CM | POA: Diagnosis not present

## 2020-01-10 ENCOUNTER — Encounter (HOSPITAL_COMMUNITY): Payer: Self-pay

## 2020-01-16 DIAGNOSIS — C50411 Malignant neoplasm of upper-outer quadrant of right female breast: Secondary | ICD-10-CM | POA: Diagnosis not present

## 2020-01-16 DIAGNOSIS — C50911 Malignant neoplasm of unspecified site of right female breast: Secondary | ICD-10-CM | POA: Diagnosis not present

## 2020-01-16 DIAGNOSIS — Z510 Encounter for antineoplastic radiation therapy: Secondary | ICD-10-CM | POA: Diagnosis not present

## 2020-01-16 DIAGNOSIS — Z170 Estrogen receptor positive status [ER+]: Secondary | ICD-10-CM | POA: Diagnosis not present

## 2020-01-21 DIAGNOSIS — Z20822 Contact with and (suspected) exposure to covid-19: Secondary | ICD-10-CM | POA: Diagnosis not present

## 2020-01-21 DIAGNOSIS — Z03818 Encounter for observation for suspected exposure to other biological agents ruled out: Secondary | ICD-10-CM | POA: Diagnosis not present

## 2020-01-22 ENCOUNTER — Other Ambulatory Visit: Payer: Self-pay | Admitting: Internal Medicine

## 2020-01-23 NOTE — Telephone Encounter (Signed)
Patient returned call and stated that she takes medication One time at Bedtime.  Stated that she Does Not need any refills at this time.

## 2020-01-23 NOTE — Telephone Encounter (Signed)
I called patient, mailbox was full.   Reason for call confirm how patient is taking medication in order to provide the appropriate dispense number   I will try to call patient again later

## 2020-01-24 DIAGNOSIS — Z510 Encounter for antineoplastic radiation therapy: Secondary | ICD-10-CM | POA: Diagnosis not present

## 2020-01-24 DIAGNOSIS — C50911 Malignant neoplasm of unspecified site of right female breast: Secondary | ICD-10-CM | POA: Diagnosis not present

## 2020-01-24 DIAGNOSIS — Z170 Estrogen receptor positive status [ER+]: Secondary | ICD-10-CM | POA: Diagnosis not present

## 2020-01-24 DIAGNOSIS — C50411 Malignant neoplasm of upper-outer quadrant of right female breast: Secondary | ICD-10-CM | POA: Diagnosis not present

## 2020-01-28 DIAGNOSIS — C50411 Malignant neoplasm of upper-outer quadrant of right female breast: Secondary | ICD-10-CM | POA: Diagnosis not present

## 2020-01-28 DIAGNOSIS — Z170 Estrogen receptor positive status [ER+]: Secondary | ICD-10-CM | POA: Diagnosis not present

## 2020-01-28 DIAGNOSIS — C50911 Malignant neoplasm of unspecified site of right female breast: Secondary | ICD-10-CM | POA: Diagnosis not present

## 2020-01-28 DIAGNOSIS — Z510 Encounter for antineoplastic radiation therapy: Secondary | ICD-10-CM | POA: Diagnosis not present

## 2020-01-29 DIAGNOSIS — Z510 Encounter for antineoplastic radiation therapy: Secondary | ICD-10-CM | POA: Diagnosis not present

## 2020-01-29 DIAGNOSIS — C50411 Malignant neoplasm of upper-outer quadrant of right female breast: Secondary | ICD-10-CM | POA: Diagnosis not present

## 2020-01-29 DIAGNOSIS — Z170 Estrogen receptor positive status [ER+]: Secondary | ICD-10-CM | POA: Diagnosis not present

## 2020-01-29 DIAGNOSIS — C50911 Malignant neoplasm of unspecified site of right female breast: Secondary | ICD-10-CM | POA: Diagnosis not present

## 2020-01-30 ENCOUNTER — Other Ambulatory Visit: Payer: Self-pay | Admitting: *Deleted

## 2020-01-30 DIAGNOSIS — C50411 Malignant neoplasm of upper-outer quadrant of right female breast: Secondary | ICD-10-CM | POA: Diagnosis not present

## 2020-01-30 DIAGNOSIS — C50911 Malignant neoplasm of unspecified site of right female breast: Secondary | ICD-10-CM | POA: Diagnosis not present

## 2020-01-30 DIAGNOSIS — Z170 Estrogen receptor positive status [ER+]: Secondary | ICD-10-CM | POA: Diagnosis not present

## 2020-01-30 DIAGNOSIS — Z510 Encounter for antineoplastic radiation therapy: Secondary | ICD-10-CM | POA: Diagnosis not present

## 2020-01-30 MED ORDER — METOPROLOL SUCCINATE ER 50 MG PO TB24: 50.0000 mg | ORAL_TABLET | Freq: Every day | ORAL | 1 refills | Status: DC

## 2020-01-30 MED ORDER — METOPROLOL SUCCINATE ER 50 MG PO TB24: 50.0000 mg | ORAL_TABLET | Freq: Every day | ORAL | 0 refills | Status: DC

## 2020-01-30 NOTE — Addendum Note (Signed)
Addended by: Rafael Bihari A on: 01/30/2020 04:59 PM   Modules accepted: Orders

## 2020-01-30 NOTE — Telephone Encounter (Signed)
Tried calling patient. LMOM to return call.   Pended Rx and sent to Dr. Mariea Clonts for approval due to [place].

## 2020-01-30 NOTE — Telephone Encounter (Signed)
Looking back, she's said she's taking a different dose of losartan than we thought and a different dose of metoprolol succinate than we thought on two occasions.  When she comes in for her next appt, I'd like for her to bring all of her pills and bottles so we can clarify.  Ok to fill the 50mg  metoprolol succinate right now.

## 2020-01-30 NOTE — Telephone Encounter (Signed)
Janett Billow with [facility] pharmacy called with patient on the other line requesting Metoprolol Succinate 50mg  once daily to be sent to Mail order #90 and to local pharmacy for #15.   In patient's medication list we have it listed as Metoprolol Succinate 100mg , but patient confirmed that it is 50mg  once daily.   Is this ok to correct in patient's chart and send Rx.  Please Advise.

## 2020-01-30 NOTE — Telephone Encounter (Signed)
Patient notified to bring medication bottles into the office at her next appointment. Agreed.

## 2020-01-31 DIAGNOSIS — Z170 Estrogen receptor positive status [ER+]: Secondary | ICD-10-CM | POA: Diagnosis not present

## 2020-01-31 DIAGNOSIS — C50411 Malignant neoplasm of upper-outer quadrant of right female breast: Secondary | ICD-10-CM | POA: Diagnosis not present

## 2020-01-31 DIAGNOSIS — Z510 Encounter for antineoplastic radiation therapy: Secondary | ICD-10-CM | POA: Diagnosis not present

## 2020-02-03 DIAGNOSIS — C50411 Malignant neoplasm of upper-outer quadrant of right female breast: Secondary | ICD-10-CM | POA: Diagnosis not present

## 2020-02-03 DIAGNOSIS — Z170 Estrogen receptor positive status [ER+]: Secondary | ICD-10-CM | POA: Diagnosis not present

## 2020-02-03 DIAGNOSIS — Z510 Encounter for antineoplastic radiation therapy: Secondary | ICD-10-CM | POA: Diagnosis not present

## 2020-02-04 DIAGNOSIS — C50411 Malignant neoplasm of upper-outer quadrant of right female breast: Secondary | ICD-10-CM | POA: Diagnosis not present

## 2020-02-04 DIAGNOSIS — Z170 Estrogen receptor positive status [ER+]: Secondary | ICD-10-CM | POA: Diagnosis not present

## 2020-02-04 DIAGNOSIS — Z510 Encounter for antineoplastic radiation therapy: Secondary | ICD-10-CM | POA: Diagnosis not present

## 2020-02-05 DIAGNOSIS — C50411 Malignant neoplasm of upper-outer quadrant of right female breast: Secondary | ICD-10-CM | POA: Diagnosis not present

## 2020-02-05 DIAGNOSIS — Z510 Encounter for antineoplastic radiation therapy: Secondary | ICD-10-CM | POA: Diagnosis not present

## 2020-02-05 DIAGNOSIS — C50911 Malignant neoplasm of unspecified site of right female breast: Secondary | ICD-10-CM | POA: Diagnosis not present

## 2020-02-05 DIAGNOSIS — Z170 Estrogen receptor positive status [ER+]: Secondary | ICD-10-CM | POA: Diagnosis not present

## 2020-02-06 DIAGNOSIS — Z170 Estrogen receptor positive status [ER+]: Secondary | ICD-10-CM | POA: Diagnosis not present

## 2020-02-06 DIAGNOSIS — Z510 Encounter for antineoplastic radiation therapy: Secondary | ICD-10-CM | POA: Diagnosis not present

## 2020-02-06 DIAGNOSIS — C50411 Malignant neoplasm of upper-outer quadrant of right female breast: Secondary | ICD-10-CM | POA: Diagnosis not present

## 2020-02-07 DIAGNOSIS — C50411 Malignant neoplasm of upper-outer quadrant of right female breast: Secondary | ICD-10-CM | POA: Diagnosis not present

## 2020-02-07 DIAGNOSIS — Z170 Estrogen receptor positive status [ER+]: Secondary | ICD-10-CM | POA: Diagnosis not present

## 2020-02-07 DIAGNOSIS — Z510 Encounter for antineoplastic radiation therapy: Secondary | ICD-10-CM | POA: Diagnosis not present

## 2020-02-11 DIAGNOSIS — Z510 Encounter for antineoplastic radiation therapy: Secondary | ICD-10-CM | POA: Diagnosis not present

## 2020-02-11 DIAGNOSIS — C50411 Malignant neoplasm of upper-outer quadrant of right female breast: Secondary | ICD-10-CM | POA: Diagnosis not present

## 2020-02-11 DIAGNOSIS — Z170 Estrogen receptor positive status [ER+]: Secondary | ICD-10-CM | POA: Diagnosis not present

## 2020-02-12 DIAGNOSIS — C50411 Malignant neoplasm of upper-outer quadrant of right female breast: Secondary | ICD-10-CM | POA: Diagnosis not present

## 2020-02-12 DIAGNOSIS — J370 Chronic laryngitis: Secondary | ICD-10-CM | POA: Diagnosis not present

## 2020-02-12 DIAGNOSIS — R490 Dysphonia: Secondary | ICD-10-CM | POA: Diagnosis not present

## 2020-02-12 DIAGNOSIS — Z170 Estrogen receptor positive status [ER+]: Secondary | ICD-10-CM | POA: Diagnosis not present

## 2020-02-12 DIAGNOSIS — Z510 Encounter for antineoplastic radiation therapy: Secondary | ICD-10-CM | POA: Diagnosis not present

## 2020-02-13 DIAGNOSIS — Z510 Encounter for antineoplastic radiation therapy: Secondary | ICD-10-CM | POA: Diagnosis not present

## 2020-02-13 DIAGNOSIS — C50411 Malignant neoplasm of upper-outer quadrant of right female breast: Secondary | ICD-10-CM | POA: Diagnosis not present

## 2020-02-13 DIAGNOSIS — Z170 Estrogen receptor positive status [ER+]: Secondary | ICD-10-CM | POA: Diagnosis not present

## 2020-02-13 DIAGNOSIS — C50911 Malignant neoplasm of unspecified site of right female breast: Secondary | ICD-10-CM | POA: Diagnosis not present

## 2020-02-14 DIAGNOSIS — C50411 Malignant neoplasm of upper-outer quadrant of right female breast: Secondary | ICD-10-CM | POA: Diagnosis not present

## 2020-02-14 DIAGNOSIS — Z510 Encounter for antineoplastic radiation therapy: Secondary | ICD-10-CM | POA: Diagnosis not present

## 2020-02-14 DIAGNOSIS — Z170 Estrogen receptor positive status [ER+]: Secondary | ICD-10-CM | POA: Diagnosis not present

## 2020-02-17 DIAGNOSIS — Z510 Encounter for antineoplastic radiation therapy: Secondary | ICD-10-CM | POA: Diagnosis not present

## 2020-02-17 DIAGNOSIS — C50411 Malignant neoplasm of upper-outer quadrant of right female breast: Secondary | ICD-10-CM | POA: Diagnosis not present

## 2020-02-17 DIAGNOSIS — Z170 Estrogen receptor positive status [ER+]: Secondary | ICD-10-CM | POA: Diagnosis not present

## 2020-02-18 ENCOUNTER — Other Ambulatory Visit: Payer: Self-pay | Admitting: *Deleted

## 2020-02-18 DIAGNOSIS — Z510 Encounter for antineoplastic radiation therapy: Secondary | ICD-10-CM | POA: Diagnosis not present

## 2020-02-18 DIAGNOSIS — I10 Essential (primary) hypertension: Secondary | ICD-10-CM

## 2020-02-18 DIAGNOSIS — Z170 Estrogen receptor positive status [ER+]: Secondary | ICD-10-CM | POA: Diagnosis not present

## 2020-02-18 DIAGNOSIS — C50411 Malignant neoplasm of upper-outer quadrant of right female breast: Secondary | ICD-10-CM | POA: Diagnosis not present

## 2020-02-18 MED ORDER — LOSARTAN POTASSIUM 100 MG PO TABS: 100.0000 mg | ORAL_TABLET | Freq: Every day | ORAL | 1 refills | Status: DC

## 2020-02-18 NOTE — Telephone Encounter (Signed)
Received refill Request from [employer] 

## 2020-02-19 DIAGNOSIS — Z510 Encounter for antineoplastic radiation therapy: Secondary | ICD-10-CM | POA: Diagnosis not present

## 2020-02-19 DIAGNOSIS — C50411 Malignant neoplasm of upper-outer quadrant of right female breast: Secondary | ICD-10-CM | POA: Diagnosis not present

## 2020-02-19 DIAGNOSIS — J383 Other diseases of vocal cords: Secondary | ICD-10-CM | POA: Diagnosis not present

## 2020-02-19 DIAGNOSIS — Z170 Estrogen receptor positive status [ER+]: Secondary | ICD-10-CM | POA: Diagnosis not present

## 2020-02-19 DIAGNOSIS — K219 Gastro-esophageal reflux disease without esophagitis: Secondary | ICD-10-CM | POA: Diagnosis not present

## 2020-02-19 DIAGNOSIS — C50911 Malignant neoplasm of unspecified site of right female breast: Secondary | ICD-10-CM | POA: Diagnosis not present

## 2020-02-19 DIAGNOSIS — J45909 Unspecified asthma, uncomplicated: Secondary | ICD-10-CM | POA: Diagnosis not present

## 2020-02-20 DIAGNOSIS — C50411 Malignant neoplasm of upper-outer quadrant of right female breast: Secondary | ICD-10-CM | POA: Diagnosis not present

## 2020-02-20 DIAGNOSIS — Z170 Estrogen receptor positive status [ER+]: Secondary | ICD-10-CM | POA: Diagnosis not present

## 2020-02-20 DIAGNOSIS — Z510 Encounter for antineoplastic radiation therapy: Secondary | ICD-10-CM | POA: Diagnosis not present

## 2020-02-21 DIAGNOSIS — Z170 Estrogen receptor positive status [ER+]: Secondary | ICD-10-CM | POA: Diagnosis not present

## 2020-02-21 DIAGNOSIS — Z510 Encounter for antineoplastic radiation therapy: Secondary | ICD-10-CM | POA: Diagnosis not present

## 2020-02-21 DIAGNOSIS — C50411 Malignant neoplasm of upper-outer quadrant of right female breast: Secondary | ICD-10-CM | POA: Diagnosis not present

## 2020-02-21 DIAGNOSIS — C50911 Malignant neoplasm of unspecified site of right female breast: Secondary | ICD-10-CM | POA: Diagnosis not present

## 2020-02-24 DIAGNOSIS — Z170 Estrogen receptor positive status [ER+]: Secondary | ICD-10-CM | POA: Diagnosis not present

## 2020-02-24 DIAGNOSIS — Z510 Encounter for antineoplastic radiation therapy: Secondary | ICD-10-CM | POA: Diagnosis not present

## 2020-02-24 DIAGNOSIS — C50411 Malignant neoplasm of upper-outer quadrant of right female breast: Secondary | ICD-10-CM | POA: Diagnosis not present

## 2020-02-25 DIAGNOSIS — Z510 Encounter for antineoplastic radiation therapy: Secondary | ICD-10-CM | POA: Diagnosis not present

## 2020-02-25 DIAGNOSIS — C50411 Malignant neoplasm of upper-outer quadrant of right female breast: Secondary | ICD-10-CM | POA: Diagnosis not present

## 2020-02-25 DIAGNOSIS — Z170 Estrogen receptor positive status [ER+]: Secondary | ICD-10-CM | POA: Diagnosis not present

## 2020-02-26 DIAGNOSIS — Z510 Encounter for antineoplastic radiation therapy: Secondary | ICD-10-CM | POA: Diagnosis not present

## 2020-02-26 DIAGNOSIS — C50411 Malignant neoplasm of upper-outer quadrant of right female breast: Secondary | ICD-10-CM | POA: Diagnosis not present

## 2020-02-26 DIAGNOSIS — S129XXD Fracture of neck, unspecified, subsequent encounter: Secondary | ICD-10-CM | POA: Diagnosis not present

## 2020-02-26 DIAGNOSIS — Z170 Estrogen receptor positive status [ER+]: Secondary | ICD-10-CM | POA: Diagnosis not present

## 2020-02-26 DIAGNOSIS — M542 Cervicalgia: Secondary | ICD-10-CM | POA: Diagnosis not present

## 2020-02-26 DIAGNOSIS — M4302 Spondylolysis, cervical region: Secondary | ICD-10-CM | POA: Diagnosis not present

## 2020-03-02 ENCOUNTER — Other Ambulatory Visit: Payer: Self-pay

## 2020-03-02 ENCOUNTER — Encounter: Payer: Self-pay | Admitting: Internal Medicine

## 2020-03-02 ENCOUNTER — Ambulatory Visit (INDEPENDENT_AMBULATORY_CARE_PROVIDER_SITE_OTHER): Payer: Medicare HMO | Admitting: Internal Medicine

## 2020-03-02 VITALS — BP 148/86 | HR 62 | Temp 97.9°F | Wt 170.6 lb

## 2020-03-02 DIAGNOSIS — Z636 Dependent relative needing care at home: Secondary | ICD-10-CM | POA: Diagnosis not present

## 2020-03-02 DIAGNOSIS — Z23 Encounter for immunization: Secondary | ICD-10-CM

## 2020-03-02 DIAGNOSIS — E039 Hypothyroidism, unspecified: Secondary | ICD-10-CM

## 2020-03-02 DIAGNOSIS — E785 Hyperlipidemia, unspecified: Secondary | ICD-10-CM

## 2020-03-02 DIAGNOSIS — Z6833 Body mass index (BMI) 33.0-33.9, adult: Secondary | ICD-10-CM

## 2020-03-02 DIAGNOSIS — R739 Hyperglycemia, unspecified: Secondary | ICD-10-CM

## 2020-03-02 DIAGNOSIS — E6609 Other obesity due to excess calories: Secondary | ICD-10-CM | POA: Diagnosis not present

## 2020-03-02 DIAGNOSIS — I10 Essential (primary) hypertension: Secondary | ICD-10-CM | POA: Diagnosis not present

## 2020-03-02 DIAGNOSIS — E1169 Type 2 diabetes mellitus with other specified complication: Secondary | ICD-10-CM

## 2020-03-02 DIAGNOSIS — Z170 Estrogen receptor positive status [ER+]: Secondary | ICD-10-CM | POA: Diagnosis not present

## 2020-03-02 DIAGNOSIS — J4520 Mild intermittent asthma, uncomplicated: Secondary | ICD-10-CM

## 2020-03-02 DIAGNOSIS — C50411 Malignant neoplasm of upper-outer quadrant of right female breast: Secondary | ICD-10-CM

## 2020-03-02 MED ORDER — METOPROLOL SUCCINATE ER 100 MG PO TB24: 100.0000 mg | ORAL_TABLET | Freq: Every day | ORAL | 3 refills | Status: DC

## 2020-03-02 NOTE — Progress Notes (Signed)
Location:  [facility] clinic Provider:  Jarrah Babich L. Mariea Clonts, D.O., C.M.D.  Goals of Care:  Advanced Directives 03/02/2020  Does Patient Have a Medical Advance Directive? Yes  Type of Advance Directive [place]  Does patient want to make changes to medical advance directive? No - Guardian declined  Copy of [place] in Chart? -   Chief Complaint  Patient presents with  . Medical Management of Chronic Issues    4 month follow up  . Health Maintenance    Dexa Scan, influenza    HPI: Patient is a 74 y.o. female seen today for medical management of chronic diseases.    Had her radiation done at [facility].  Finished it last week.  She does have to take a pill for the next 5 years--anastrozole.    She has been referred for voice rehab and for a lung check at [facility].  She has had reflux damage her vocal cords.  She has to drink water after eating breakfast.  She had the swallow study before.    BP was up during radiation.  193/80stg once.  Lowest was 148/70stg.  She left her paperwork.  She'd eaten chicken potpie with a lot of salt, cookies she was eating and nabs.  148/86 today.    Calcium was added to her regimen--citracal 2 a day--pharmacst recommended closest to it.  She'd had another bone density thru [facility] though we'd done it in our system.  Had osteopenia on ours.    She's been stretching.  She's still doing her treadmill exercises.    She's cut down her coffee to daily.  She's rarely using ibuprofen 400mg .  She's drinking green tea instead with lemon.  Sodium reduction as above.   She's going to get a hearing aide on the right ear.    Past Medical History:  Diagnosis Date  . Arthritis   . Asthma    " allergies"  . Chronic back pain   . Complication of anesthesia 1968   "fought the anesthesia" problems with memory 05/06/15 at [facility]"  . Depression   . Fibromyalgia   . GERD (gastroesophageal reflux disease)   .  History of hiatal hernia   . Hypertension   . Hypothyroidism   . PONV (postoperative nausea and vomiting)   . Thyroid disease   . Torn meniscus    left knee  . Wears glasses     Past Surgical History:  Procedure Laterality Date  . ABDOMINAL HYSTERECTOMY  1976  . ANTERIOR CERVICAL DECOMP/DISCECTOMY FUSION N/A 05/06/2015   Procedure: Cervical four-five, Cervical five-six, Cervical six-seven Anterior cervical decompression/diskectomy/fusion;  Surgeon: Leeroy Cha, MD;  Location: [place] [facility];  Service: Neurosurgery;  Laterality: N/A;  C4-5 C5-6 C6-7 Anterior cervical decompression/diskectomy/fusion  . APPENDECTOMY    . BACK SURGERY    . BREAST LUMPECTOMY WITH RADIOACTIVE SEED LOCALIZATION Right 11/15/2019   Procedure: RIGHT BREAST LUMPECTOMY WITH RADIOACTIVE SEED LOCALIZATION;  Surgeon: Jovita Kussmaul, MD;  Location: [place];  Service: General;  Laterality: Right;  . BREAST SURGERY Bilateral    biopsy- 2 different times  . CARPAL TUNNEL RELEASE     B/L  . CHOLECYSTECTOMY    . COLONOSCOPY  2014  . COLONOSCOPY W/ POLYPECTOMY    . dental implants    . DILATION AND CURETTAGE OF UTERUS  1968  . ELBOW ARTHROSCOPY Right    right  . FOOT GANGLION EXCISION Left  left  . KNEE ARTHROSCOPY Right    torn ligament  . LAMINECTOMY     L 4 and 5; 2 separate surgeries  . left hand     carpal tunel  . right hand     3 surgeries- carpal tunel    Allergies  Allergen Reactions  . Beta Adrenergic Blockers Other (See Comments)    Certain beta blockers make me hoarse.  . Mobic [Meloxicam] Hypertension    Made blood pressure extremely high  . Sulfa Antibiotics Itching and Swelling  . Tape Rash    Paper tape  . Aspirin Other (See Comments)    Stomach burning; increased acid  . Oxycodone Other (See Comments)    It makes her someone she's not     Outpatient Encounter Medications as of 03/02/2020  Medication Sig  . anastrozole (ARIMIDEX) 1 MG tablet Take 1 mg by mouth  daily.  . calcium gluconate 500 MG tablet Take 1 tablet by mouth 3 (three) times daily.  Marland Kitchen aspirin EC 81 MG tablet Take 81 mg by mouth daily.  . Cholecalciferol (D3 SUPER STRENGTH) 2000 UNITS CAPS Take 2,000 Units by mouth daily.   . cyanocobalamin 2000 MCG tablet Take 2,000 mcg by mouth daily.  . cyclobenzaprine (FLEXERIL) 10 MG tablet TAKE 1/2 TO 1 TABLET UP TO TWICE DAILY AS NEEDED  . estazolam (PROSOM) 2 MG tablet Take 1 tablet (2 mg total) by mouth at bedtime as needed (muscle pain and insomnia).  . Glucosamine 500 MG CAPS Take by mouth.  . levalbuterol (XOPENEX HFA) 45 MCG/ACT inhaler Inhale 2 puffs into the lungs every 6 (six) hours as needed for wheezing or shortness of breath.   . levothyroxine (SYNTHROID) 50 MCG tablet TAKE 1 TABLET EVERY MORNING  . losartan (COZAAR) 100 MG tablet Take 1 tablet (100 mg total) by mouth daily.  . metoprolol succinate (TOPROL-XL) 50 MG 24 hr tablet Take 1 tablet (50 mg total) by mouth daily. Take with or immediately following a meal.  . mometasone (NASONEX) 50 MCG/ACT nasal spray Place 2 sprays into the nose daily as needed (allergies).  . pantoprazole (PROTONIX) 40 MG tablet TAKE 1 TABLET EVERY DAY  . [DISCONTINUED] traMADol (ULTRAM) 50 MG tablet Take 1-2 tablets (50-100 mg total) by mouth every 6 (six) hours as needed.  . [DISCONTINUED] triamcinolone (KENALOG) 0.025 % ointment Apply 1 application topically 2 (two) times daily.   No facility-administered encounter medications on file as of 03/02/2020.    Review of Systems:  Review of Systems  Constitutional: Negative for chills and fever.  HENT: Negative for congestion and hearing loss.   Eyes: Negative for blurred vision.  Respiratory: Negative for cough and shortness of breath.   Cardiovascular: Negative for chest pain, palpitations and leg swelling.  Gastrointestinal: Negative for abdominal pain.  Genitourinary: Negative for dysuria.  Musculoskeletal: Positive for myalgias. Negative for falls.   Skin: Negative for itching and rash.  Neurological: Negative for dizziness and loss of consciousness.  Psychiatric/Behavioral: Negative for depression and memory loss. The patient is nervous/anxious. The patient does not have insomnia.        Caregiver stress    Health Maintenance  Topic Date Due  . DEXA SCAN  Never done  . TETANUS/TDAP  12/10/2020  . COLONOSCOPY  06/06/2024  . INFLUENZA VACCINE  Completed  . COVID-19 Vaccine  Completed  . Hepatitis C Screening  Completed  . PNA vac Low Risk Adult  Completed    Physical Exam: Vitals:   03/02/20  1308  BP: (!) 148/86  Pulse: 62  Temp: 97.9 F (36.6 C)  SpO2: 98%  Weight: 170 lb 9.6 oz (77.4 kg)   Body mass index is 33.32 kg/m. Physical Exam Vitals reviewed.  Constitutional:      General: She is not in acute distress.    Appearance: Normal appearance. She is not toxic-appearing.  HENT:     Head: Normocephalic and atraumatic.     Mouth/Throat:     Comments: Hoarse voice Cardiovascular:     Rate and Rhythm: Normal rate and regular rhythm.     Pulses: Normal pulses.     Heart sounds: Normal heart sounds.  Pulmonary:     Effort: Pulmonary effort is normal.     Breath sounds: Normal breath sounds. No wheezing, rhonchi or rales.  Abdominal:     General: Bowel sounds are normal.  Musculoskeletal:        General: Normal range of motion.     Right lower leg: No edema.     Left lower leg: No edema.  Skin:    General: Skin is warm and dry.  Neurological:     General: No focal deficit present.     Mental Status: She is alert and oriented to person, place, and time.     Motor: No weakness.     Gait: Gait normal.  Psychiatric:     Comments: Calmer today     Labs reviewed: Basic Metabolic Panel: Recent Labs    07/05/19 1206  NA 139  K 4.4  CL 105  CO2 27  GLUCOSE 96  BUN 14  CREATININE 0.90  CALCIUM 9.9  TSH 1.46   Liver Function Tests: Recent Labs    07/05/19 1206  AST 22  ALT 20  BILITOT 0.4   PROT 6.8   No results for input(s): LIPASE, AMYLASE in the last 8760 hours. No results for input(s): AMMONIA in the last 8760 hours. CBC: Recent Labs    07/05/19 1206  WBC 5.5  NEUTROABS 2,833  HGB 11.1*  HCT 35.0  MCV 77.6*  PLT 351   Lipid Panel: Recent Labs    07/05/19 1206  CHOL 173  HDL 71  LDLCALC 83  TRIG 101  CHOLHDL 2.4   Lab Results  Component Value Date   HGBA1C 6.1 (H) 07/05/2019    Procedures since last visit: No results found.  Assessment/Plan 1. Malignant neoplasm of upper-outer quadrant of right breast in female, estrogen receptor positive ([place]) -s/p lumpectomy and XRT, currently on 5 yr course of anastrozole--just began, but tolerating so far - CBC with Differential/Platelet - COMPLETE METABOLIC PANEL WITH GFR  2. Essential hypertension - fixed med list--pt taking 100mg  of both losartan and toprol xl, she says and our list still just had 50mg  of toprol - metoprolol succinate (TOPROL-XL) 100 MG 24 hr tablet; Take 1 tablet (100 mg total) by mouth daily. Take with or immediately following a meal.  Dispense: 90 tablet; Refill: 3  3. Mild intermittent asthma without complication -stable with prn xopenex only  4. Acquired hypothyroidism -cont current levothyroxine, f/u lab - TSH  5. Caregiver stress -ongoing with her husband's dementia, seems to be handling better than she was last few visits  6. Need for influenza vaccination - Flu Vaccine QUAD High Dose(Fluad) given  7. Body mass index (BMI) of 33.0-33.9 in adult - remains elevated, she's hoping she has the side effect of weight loss from anastrozole b/c all of her exercise does not seem to help  her lose - Hemoglobin A1c - Lipid panel  8. Class 1 obesity due to excess calories with serious comorbidity and body mass index (BMI) of 33.0 to 33.9 in adult - ongoing, counseled on diet and exercise - Hemoglobin A1c - Lipid panel  9. Hyperglycemia -stable, cont exercise program - Hemoglobin  A1c  10. Hyperlipidemia associated with type 2 diabetes mellitus ([place]) -counseled on low fat, low cholesterol diet, continue exercise program - Lipid panel  Labs/tests ordered:   Lab Orders     CBC with Differential/Platelet     COMPLETE METABOLIC PANEL WITH GFR     Hemoglobin A1c     Lipid panel     TSH  Next appt:  07/02/2020 with fasting labs before  Dave Mergen L. Gabby Rackers, D.O. [facility] [address], [place] [address] Cell Phone (Mon-Fri 8am-5pm):  [phone] On Call:  [phone] & follow prompts after 5pm & weekends Office Phone:  [phone] Office Fax:  [phone]

## 2020-03-02 NOTE — Patient Instructions (Signed)
When you can, drop off your paperwork you meant to bring me.

## 2020-03-03 LAB — LIPID PANEL
Cholesterol: 176 mg/dL (ref <200)
HDL: 61 mg/dL (ref >=50)
LDL Cholesterol (Calc): 92 mg/dL (calc)
Non-HDL Cholesterol (Calc): 115 mg/dL (calc) (ref <130)
Total CHOL/HDL Ratio: 2.9 (calc) (ref <5.0)
Triglycerides: 136 mg/dL (ref <150)

## 2020-03-03 LAB — COMPLETE METABOLIC PANEL WITH GFR
AG Ratio: 1.6 (calc) (ref 1.0–2.5)
ALT: 20 U/L (ref 6–29)
AST: 23 U/L (ref 10–35)
Albumin: 4 g/dL (ref 3.6–5.1)
Alkaline phosphatase (APISO): 79 U/L (ref 37–153)
BUN/Creatinine Ratio: 14 (calc) (ref 6–22)
BUN: 14 mg/dL (ref 7–25)
CO2: 23 mmol/L (ref 20–32)
Calcium: 9.8 mg/dL (ref 8.6–10.4)
Chloride: 106 mmol/L (ref 98–110)
Creat: 0.97 mg/dL — ABNORMAL HIGH (ref 0.60–0.93)
GFR, Est African American: 66 mL/min/{1.73_m2} (ref >=60)
GFR, Est Non African American: 57 mL/min/{1.73_m2} — ABNORMAL LOW (ref >=60)
Globulin: 2.5 g/dL (calc) (ref 1.9–3.7)
Glucose, Bld: 95 mg/dL (ref 65–99)
Potassium: 4.2 mmol/L (ref 3.5–5.3)
Sodium: 138 mmol/L (ref 135–146)
Total Bilirubin: 0.5 mg/dL (ref 0.2–1.2)
Total Protein: 6.5 g/dL (ref 6.1–8.1)

## 2020-03-03 LAB — TSH: TSH: 1.78 mIU/L (ref 0.40–4.50)

## 2020-03-03 LAB — CBC WITH DIFFERENTIAL/PLATELET
Absolute Monocytes: 499 cells/uL (ref 200–950)
Basophils Absolute: 31 cells/uL (ref 0–200)
Basophils Relative: 0.8 %
Eosinophils Absolute: 0 cells/uL — ABNORMAL LOW (ref 15–500)
Eosinophils Relative: 0 %
HCT: 40.5 % (ref 35.0–45.0)
Hemoglobin: 13 g/dL (ref 11.7–15.5)
Lymphs Abs: 1115 cells/uL (ref 850–3900)
MCH: 27.8 pg (ref 27.0–33.0)
MCHC: 32.1 g/dL (ref 32.0–36.0)
MCV: 86.5 fL (ref 80.0–100.0)
MPV: 10.3 fL (ref 7.5–12.5)
Monocytes Relative: 12.8 %
Neutro Abs: 2254 cells/uL (ref 1500–7800)
Neutrophils Relative %: 57.8 %
Platelets: 277 10*3/uL (ref 140–400)
RBC: 4.68 10*6/uL (ref 3.80–5.10)
RDW: 14.8 % (ref 11.0–15.0)
Total Lymphocyte: 28.6 %
WBC: 3.9 10*3/uL (ref 3.8–10.8)

## 2020-03-03 LAB — HEMOGLOBIN A1C
Hgb A1c MFr Bld: 5.7 % of total Hgb — ABNORMAL HIGH (ref <5.7)
Mean Plasma Glucose: 117 (calc)
eAG (mmol/L): 6.5 (calc)

## 2020-03-23 DIAGNOSIS — R059 Cough, unspecified: Secondary | ICD-10-CM | POA: Diagnosis not present

## 2020-03-23 DIAGNOSIS — J45909 Unspecified asthma, uncomplicated: Secondary | ICD-10-CM | POA: Diagnosis not present

## 2020-03-25 DIAGNOSIS — J383 Other diseases of vocal cords: Secondary | ICD-10-CM | POA: Diagnosis not present

## 2020-03-25 DIAGNOSIS — K219 Gastro-esophageal reflux disease without esophagitis: Secondary | ICD-10-CM | POA: Diagnosis not present

## 2020-03-25 DIAGNOSIS — J45909 Unspecified asthma, uncomplicated: Secondary | ICD-10-CM | POA: Diagnosis not present

## 2020-03-25 DIAGNOSIS — J370 Chronic laryngitis: Secondary | ICD-10-CM | POA: Diagnosis not present

## 2020-03-25 DIAGNOSIS — R490 Dysphonia: Secondary | ICD-10-CM | POA: Diagnosis not present

## 2020-03-26 ENCOUNTER — Other Ambulatory Visit: Payer: Self-pay

## 2020-03-26 ENCOUNTER — Other Ambulatory Visit: Payer: Self-pay | Admitting: Family

## 2020-03-26 DIAGNOSIS — M797 Fibromyalgia: Secondary | ICD-10-CM

## 2020-03-26 MED ORDER — ESTAZOLAM 2 MG PO TABS: ORAL_TABLET | ORAL | 0 refills | Status: DC

## 2020-03-26 NOTE — Telephone Encounter (Signed)
I accidentally printed medication instead of pending it. Patient request refill on medication "Estazolam". Medication pend and sent to provider Mariea Clonts, Tiffany L, DO . Please Advise.

## 2020-03-27 ENCOUNTER — Telehealth: Payer: Self-pay | Admitting: *Deleted

## 2020-03-27 NOTE — Telephone Encounter (Signed)
Received Fax from [facility] stating medication approval was DENIED.   Placed denial in Dr. Cyndi Lennert folder to review.

## 2020-03-27 NOTE — Telephone Encounter (Signed)
Received prior authorization from pharmacy for Estazolam 2mg .  Initiated through [facility]. Went into determination.   [id number] PA Case ID: [id number] Rx #: [id number]

## 2020-03-30 ENCOUNTER — Other Ambulatory Visit: Payer: Self-pay | Admitting: *Deleted

## 2020-03-30 DIAGNOSIS — M79642 Pain in left hand: Secondary | ICD-10-CM | POA: Diagnosis not present

## 2020-03-30 DIAGNOSIS — G5602 Carpal tunnel syndrome, left upper limb: Secondary | ICD-10-CM | POA: Diagnosis not present

## 2020-03-30 DIAGNOSIS — M65341 Trigger finger, right ring finger: Secondary | ICD-10-CM | POA: Diagnosis not present

## 2020-03-30 DIAGNOSIS — M797 Fibromyalgia: Secondary | ICD-10-CM

## 2020-03-30 DIAGNOSIS — M65332 Trigger finger, left middle finger: Secondary | ICD-10-CM | POA: Diagnosis not present

## 2020-03-30 MED ORDER — ESTAZOLAM 2 MG PO TABS: ORAL_TABLET | ORAL | 0 refills | Status: DC

## 2020-03-30 NOTE — Telephone Encounter (Signed)
Patient called and stated that she would like the Rx faxed to [facility] because they will take her Good Rx discount.   Pended Rx and sent to Dr. Mariea Clonts for approval.

## 2020-04-02 ENCOUNTER — Telehealth: Payer: Self-pay | Admitting: *Deleted

## 2020-04-02 ENCOUNTER — Ambulatory Visit (INDEPENDENT_AMBULATORY_CARE_PROVIDER_SITE_OTHER): Payer: Medicare HMO | Admitting: Nurse Practitioner

## 2020-04-02 ENCOUNTER — Other Ambulatory Visit: Payer: Self-pay

## 2020-04-02 ENCOUNTER — Encounter: Payer: Self-pay | Admitting: Nurse Practitioner

## 2020-04-02 VITALS — BP 160/90 | HR 89 | Temp 97.3°F | Ht 60.0 in | Wt 173.0 lb

## 2020-04-02 DIAGNOSIS — I10 Essential (primary) hypertension: Secondary | ICD-10-CM

## 2020-04-02 DIAGNOSIS — Z636 Dependent relative needing care at home: Secondary | ICD-10-CM | POA: Diagnosis not present

## 2020-04-02 DIAGNOSIS — J370 Chronic laryngitis: Secondary | ICD-10-CM | POA: Diagnosis not present

## 2020-04-02 DIAGNOSIS — F419 Anxiety disorder, unspecified: Secondary | ICD-10-CM

## 2020-04-02 MED ORDER — DULOXETINE HCL 30 MG PO CPEP: 30.0000 mg | ORAL_CAPSULE | Freq: Every day | ORAL | 3 refills | Status: DC

## 2020-04-02 MED ORDER — AMLODIPINE BESYLATE 5 MG PO TABS: 5.0000 mg | ORAL_TABLET | Freq: Every day | ORAL | 1 refills | Status: DC

## 2020-04-02 NOTE — Telephone Encounter (Signed)
Patient is coming in at 2:45 to see Janett Billow for her blood pressure.

## 2020-04-02 NOTE — Progress Notes (Signed)
Careteam: Patient Care Team: Gayland Curry, DO as PCP - General (Geriatric Medicine) Mammography, [facility] (Diagnostic Radiology) Jovita Gamma, MD as Consulting Physician (Neurosurgery) Wilford Corner, MD as Consulting Physician (Gastroenterology) Allyn Kenner, MD (Dermatology) Thornell Sartorius, MD (Otolaryngology)  PLACE OF SERVICE:  [place]  Advanced Directive information    Allergies  Allergen Reactions  . Beta Adrenergic Blockers Other (See Comments)    Certain beta blockers make me hoarse.  . Mobic [Meloxicam] Hypertension    Made blood pressure extremely high  . Sulfa Antibiotics Itching and Swelling  . Tape Rash    Paper tape  . Aspirin Other (See Comments)    Stomach burning; increased acid  . Oxycodone Other (See Comments)    It makes her someone she's not     Chief Complaint  Patient presents with  . Acute Visit    BP concerns. Blood pressure seems to be running high for about last 2 months.Patient has not had dizziness.Patient states she feels stressed. She takes care of her husband who had a stroke.Patient states that Symbicort inhaler gives her the jitters when she uses it twice a day.     HPI: Patient is a 74 y.o. female due to elevated blood pressure  Reports blood pressure has been elevated most of the year.  Generally sbp 150 and up. As high as 190/89 Denies headaches, blurred vision, chest pains.  Not taking any decongestant Not taking ASA She is taking losartan 100 mg and metoprolol 100 mg   Reports she is taking care of her husband, was eating more microwave dinners.  Does salt food for breakfast but uses minimal.   Went to ENT/pulmonary due to "someone standing on her vocal cords"  She was placed on symicort and if she uses twice daily it makes her too jumpy and jittery.  Has been spitting up mucous and now throat is sore.  Following up with lung specialist in 3 weeks.   Has hiatal hernia and reflux has made dietary modifications  due to this as well. Also taking protonix.   Has a lot of anxiety due to her husband. Had to take him to an appt and they were late, they refused to see them.  He is very forgetful and not able to do a lot of things that she is now realizing.  She says he is more agitated.    Has a lot of hip and back pain, also has hand pain.     Review of Systems:  Review of Systems  Constitutional: Negative for chills, fever and weight loss.  HENT: Positive for sore throat. Negative for tinnitus.   Respiratory: Positive for sputum production. Negative for cough and shortness of breath.   Cardiovascular: Negative for chest pain, palpitations and leg swelling.  Gastrointestinal: Negative for abdominal pain, constipation, diarrhea and heartburn.  Genitourinary: Negative for dysuria, frequency and urgency.  Musculoskeletal: Negative for back pain, falls, joint pain and myalgias.  Skin: Negative.   Neurological: Negative for dizziness and headaches.  Psychiatric/Behavioral: Negative for depression and memory loss. The patient is nervous/anxious. The patient does not have insomnia.     Past Medical History:  Diagnosis Date  . Arthritis   . Asthma    " allergies"  . Chronic back pain   . Complication of anesthesia 1968   "fought the anesthesia" problems with memory 05/06/15 at [facility]"  . Depression   . Fibromyalgia   . GERD (gastroesophageal reflux disease)   . History  of hiatal hernia   . Hypertension   . Hypothyroidism   . PONV (postoperative nausea and vomiting)   . Thyroid disease   . Torn meniscus    left knee  . Wears glasses    Past Surgical History:  Procedure Laterality Date  . ABDOMINAL HYSTERECTOMY  1976  . ANTERIOR CERVICAL DECOMP/DISCECTOMY FUSION N/A 05/06/2015   Procedure: Cervical four-five, Cervical five-six, Cervical six-seven Anterior cervical decompression/diskectomy/fusion;  Surgeon: Leeroy Cha, MD;  Location: [place] [facility];  Service: Neurosurgery;   Laterality: N/A;  C4-5 C5-6 C6-7 Anterior cervical decompression/diskectomy/fusion  . APPENDECTOMY    . BACK SURGERY    . BREAST LUMPECTOMY WITH RADIOACTIVE SEED LOCALIZATION Right 11/15/2019   Procedure: RIGHT BREAST LUMPECTOMY WITH RADIOACTIVE SEED LOCALIZATION;  Surgeon: Jovita Kussmaul, MD;  Location: [place];  Service: General;  Laterality: Right;  . BREAST SURGERY Bilateral    biopsy- 2 different times  . CARPAL TUNNEL RELEASE     B/L  . CHOLECYSTECTOMY    . COLONOSCOPY  2014  . COLONOSCOPY W/ POLYPECTOMY    . dental implants    . DILATION AND CURETTAGE OF UTERUS  1968  . ELBOW ARTHROSCOPY Right    right  . FOOT GANGLION EXCISION Left    left  . KNEE ARTHROSCOPY Right    torn ligament  . LAMINECTOMY     L 4 and 5; 2 separate surgeries  . left hand     carpal tunel  . right hand     3 surgeries- carpal tunel   Social History:   reports that she has never smoked. She has never used smokeless tobacco. She reports that she does not drink alcohol and does not use drugs.  Family History  Problem Relation Age of Onset  . Hypertension Mother   . Lupus Mother   . Heart attack Mother   . Cancer Mother   . Stroke Mother   . Hypertension Father   . Heart disease Father   . Lupus Father   . Heart attack Father   . Heart attack Brother   . Leukemia Brother   . HIV/AIDS Sister   . Leukemia Brother   . Cancer Sister        intestinal  . Lupus Sister   . Breast cancer Sister   . Diabetes Daughter   . Diabetes Daughter   . Fibromyalgia Daughter   . Colon cancer Neg Hx     Medications: Patient's Medications  New Prescriptions   No medications on file  Previous Medications   ANASTROZOLE (ARIMIDEX) 1 MG TABLET    Take 1 mg by mouth daily.   BUDESONIDE-FORMOTEROL (SYMBICORT) 160-4.5 MCG/ACT INHALER    Inhale 2 puffs into the lungs 2 (two) times daily.   CALCIUM GLUCONATE 500 MG TABLET    Take 1 tablet by mouth 3 (three) times daily.   CHOLECALCIFEROL (D3  SUPER STRENGTH) 2000 UNITS CAPS    Take 2,000 Units by mouth daily.    CYANOCOBALAMIN 2000 MCG TABLET    Take 2,000 mcg by mouth daily.   CYCLOBENZAPRINE (FLEXERIL) 10 MG TABLET    TAKE 1/2 TO 1 TABLET UP TO TWICE DAILY AS NEEDED   ESTAZOLAM (PROSOM) 2 MG TABLET    Take one tablet by mouth at bedtime as needed for muscle pain and insomnia   GLUCOSAMINE 500 MG CAPS    Take by mouth.   LEVOTHYROXINE (SYNTHROID) 50 MCG TABLET    TAKE 1 TABLET EVERY MORNING  LOSARTAN (COZAAR) 100 MG TABLET    Take 1 tablet (100 mg total) by mouth daily.   METOPROLOL SUCCINATE (TOPROL-XL) 100 MG 24 HR TABLET    Take 1 tablet (100 mg total) by mouth daily. Take with or immediately following a meal.   MOMETASONE (NASONEX) 50 MCG/ACT NASAL SPRAY    Place 2 sprays into the nose daily as needed (allergies).   PANTOPRAZOLE (PROTONIX) 40 MG TABLET    TAKE 1 TABLET EVERY DAY  Modified Medications   No medications on file  Discontinued Medications   ASPIRIN EC 81 MG TABLET    Take 81 mg by mouth daily.   LEVALBUTEROL (XOPENEX HFA) 45 MCG/ACT INHALER    Inhale 2 puffs into the lungs every 6 (six) hours as needed for wheezing or shortness of breath.     Physical Exam:  Vitals:   04/02/20 1445  BP: (!) 160/90  Pulse: 89  Temp: (!) 97.3 F (36.3 C)  TempSrc: Temporal  SpO2: 94%  Weight: 173 lb (78.5 kg)  Height: 5' (1.524 m)   Body mass index is 33.79 kg/m. Wt Readings from Last 3 Encounters:  04/02/20 173 lb (78.5 kg)  03/02/20 170 lb 9.6 oz (77.4 kg)  11/15/19 171 lb 15.3 oz (78 kg)    Physical Exam Constitutional:      General: She is not in acute distress.    Appearance: She is well-developed. She is not diaphoretic.  HENT:     Head: Normocephalic and atraumatic.     Mouth/Throat:     Pharynx: Posterior oropharyngeal erythema present. No oropharyngeal exudate.  Eyes:     Conjunctiva/sclera: Conjunctivae normal.     Pupils: Pupils are equal, round, and reactive to light.  Cardiovascular:     Rate  and Rhythm: Normal rate and regular rhythm.     Heart sounds: Normal heart sounds.  Pulmonary:     Effort: Pulmonary effort is normal.     Breath sounds: Normal breath sounds.  Abdominal:     General: Bowel sounds are normal.     Palpations: Abdomen is soft.  Musculoskeletal:        General: No tenderness.     Cervical back: Normal range of motion and neck supple.  Skin:    General: Skin is warm and dry.  Neurological:     Mental Status: She is alert and oriented to person, place, and time.     Labs reviewed: Basic Metabolic Panel: Recent Labs    07/05/19 1206 03/02/20 1403  NA 139 138  K 4.4 4.2  CL 105 106  CO2 27 23  GLUCOSE 96 95  BUN 14 14  CREATININE 0.90 0.97*  CALCIUM 9.9 9.8  TSH 1.46 1.78   Liver Function Tests: Recent Labs    07/05/19 1206 03/02/20 1403  AST 22 23  ALT 20 20  BILITOT 0.4 0.5  PROT 6.8 6.5   No results for input(s): LIPASE, AMYLASE in the last 8760 hours. No results for input(s): AMMONIA in the last 8760 hours. CBC: Recent Labs    07/05/19 1206 03/02/20 1403  WBC 5.5 3.9  NEUTROABS 2,833 2,254  HGB 11.1* 13.0  HCT 35.0 40.5  MCV 77.6* 86.5  PLT 351 277   Lipid Panel: Recent Labs    07/05/19 1206 03/02/20 1403  CHOL 173 176  HDL 71 61  LDLCALC 83 92  TRIG 101 136  CHOLHDL 2.4 2.9   TSH: Recent Labs    07/05/19 1206 03/02/20 1403  TSH 1.46 1.78   A1C: Lab Results  Component Value Date   HGBA1C 5.7 (H) 03/02/2020     Assessment/Plan 1. Essential hypertension -not controlled, increase stress, eating for convenience. Educated for dietary modifications will add norvasc 5 mg daily   - amLODipine (NORVASC) 5 MG tablet; Take 1 tablet (5 mg total) by mouth daily.  Dispense: 30 tablet; Refill: 1  2. Caregiver stress -husband with dementia that is progressing, encouraged support group and time for herself.   3. Anxiety Due to being the primary caregiver for husband, increase in stress as well.  - DULoxetine  (CYMBALTA) 30 MG capsule; Take 1 capsule (30 mg total) by mouth daily.  Dispense: 30 capsule; Refill: 3  4. Chronic laryngitis Ongoing, erythema noted but without exudate. No fevers or chills. Following with ENT.   Next appt: 4 weeks for mood.  Carlos American. [place], [facility] [phone]

## 2020-04-02 NOTE — Telephone Encounter (Signed)
Patient called and stated that she has been keeping check on her blood pressure but the top number will not go under 150.   03/30/20 173/80 03/31/20 153/78 04/01/20 157/78  Patient stated that she is taking her medications as directed, watching her diet and Exercising.  Wonders if something needs adjusted.  Please Advise.

## 2020-04-10 ENCOUNTER — Other Ambulatory Visit: Payer: Self-pay

## 2020-04-10 MED ORDER — CYCLOBENZAPRINE HCL 10 MG PO TABS: ORAL_TABLET | ORAL | 2 refills | Status: DC

## 2020-04-13 DIAGNOSIS — J383 Other diseases of vocal cords: Secondary | ICD-10-CM | POA: Diagnosis not present

## 2020-04-13 DIAGNOSIS — J45909 Unspecified asthma, uncomplicated: Secondary | ICD-10-CM | POA: Diagnosis not present

## 2020-04-13 DIAGNOSIS — J370 Chronic laryngitis: Secondary | ICD-10-CM | POA: Diagnosis not present

## 2020-04-15 DIAGNOSIS — Z170 Estrogen receptor positive status [ER+]: Secondary | ICD-10-CM | POA: Diagnosis not present

## 2020-04-15 DIAGNOSIS — C50411 Malignant neoplasm of upper-outer quadrant of right female breast: Secondary | ICD-10-CM | POA: Diagnosis not present

## 2020-04-15 HISTORY — PX: CARPAL TUNNEL RELEASE: SHX101

## 2020-04-17 DIAGNOSIS — G8918 Other acute postprocedural pain: Secondary | ICD-10-CM | POA: Diagnosis not present

## 2020-04-17 DIAGNOSIS — G5602 Carpal tunnel syndrome, left upper limb: Secondary | ICD-10-CM | POA: Diagnosis not present

## 2020-04-17 DIAGNOSIS — M65342 Trigger finger, left ring finger: Secondary | ICD-10-CM | POA: Diagnosis not present

## 2020-04-17 DIAGNOSIS — M65332 Trigger finger, left middle finger: Secondary | ICD-10-CM | POA: Diagnosis not present

## 2020-04-22 DIAGNOSIS — J45909 Unspecified asthma, uncomplicated: Secondary | ICD-10-CM | POA: Diagnosis not present

## 2020-04-23 ENCOUNTER — Telehealth: Payer: Self-pay

## 2020-04-23 NOTE — Telephone Encounter (Signed)
Patient state the script goes to [facility] and she pays for it out of pocket thru [facility]. [employer] has nothing to do with it she is still taking and getting the medication.

## 2020-04-23 NOTE — Telephone Encounter (Signed)
Good, but I don't know why they keep sending the denial over and over.

## 2020-04-29 ENCOUNTER — Ambulatory Visit (INDEPENDENT_AMBULATORY_CARE_PROVIDER_SITE_OTHER): Payer: Medicare HMO | Admitting: Nurse Practitioner

## 2020-04-29 ENCOUNTER — Encounter: Payer: Self-pay | Admitting: Nurse Practitioner

## 2020-04-29 ENCOUNTER — Other Ambulatory Visit: Payer: Self-pay

## 2020-04-29 VITALS — BP 138/80 | HR 67 | Temp 96.8°F | Ht 60.0 in | Wt 171.0 lb

## 2020-04-29 DIAGNOSIS — F419 Anxiety disorder, unspecified: Secondary | ICD-10-CM

## 2020-04-29 DIAGNOSIS — I10 Essential (primary) hypertension: Secondary | ICD-10-CM

## 2020-04-29 MED ORDER — AMLODIPINE BESYLATE 5 MG PO TABS: 5.0000 mg | ORAL_TABLET | Freq: Every day | ORAL | 1 refills | Status: DC

## 2020-04-29 NOTE — Patient Instructions (Addendum)
Continue norvasc 5 mg daily Continue on metoprolol and losartan  Limit and avoid sodium    DASH Eating Plan DASH stands for "Dietary Approaches to Stop Hypertension." The DASH eating plan is a healthy eating plan that has been shown to reduce high blood pressure (hypertension). It may also reduce your risk for type 2 diabetes, heart disease, and stroke. The DASH eating plan may also help with weight loss. What are tips for following this plan?  General guidelines  Avoid eating more than 2,300 mg (milligrams) of salt (sodium) a day. If you have hypertension, you may need to reduce your sodium intake to 1,500 mg a day.  Limit alcohol intake to no more than 1 drink a day for nonpregnant women and 2 drinks a day for men. One drink equals 12 oz of beer, 5 oz of wine, or 1 oz of hard liquor.  Work with your health care provider to maintain a healthy body weight or to lose weight. Ask what an ideal weight is for you.  Get at least 30 minutes of exercise that causes your heart to beat faster (aerobic exercise) most days of the week. Activities may include walking, swimming, or biking.  Work with your health care provider or diet and nutrition specialist (dietitian) to adjust your eating plan to your individual calorie needs. Reading food labels   Check food labels for the amount of sodium per serving. Choose foods with less than 5 percent of the Daily Value of sodium. Generally, foods with less than 300 mg of sodium per serving fit into this eating plan.  To find whole grains, look for the word "whole" as the first word in the ingredient list. Shopping  Buy products labeled as "low-sodium" or "no salt added."  Buy fresh foods. Avoid canned foods and premade or frozen meals. Cooking  Avoid adding salt when cooking. Use salt-free seasonings or herbs instead of table salt or sea salt. Check with your health care provider or pharmacist before using salt substitutes.  Do not fry foods. Cook  foods using healthy methods such as baking, boiling, grilling, and broiling instead.  Cook with heart-healthy oils, such as olive, canola, soybean, or sunflower oil. Meal planning  Eat a balanced diet that includes: ? 5 or more servings of fruits and vegetables each day. At each meal, try to fill half of your plate with fruits and vegetables. ? Up to 6-8 servings of whole grains each day. ? Less than 6 oz of lean meat, poultry, or fish each day. A 3-oz serving of meat is about the same size as a deck of cards. One egg equals 1 oz. ? 2 servings of low-fat dairy each day. ? A serving of nuts, seeds, or beans 5 times each week. ? Heart-healthy fats. Healthy fats called Omega-3 fatty acids are found in foods such as flaxseeds and coldwater fish, like sardines, salmon, and mackerel.  Limit how much you eat of the following: ? Canned or prepackaged foods. ? Food that is high in trans fat, such as fried foods. ? Food that is high in saturated fat, such as fatty meat. ? Sweets, desserts, sugary drinks, and other foods with added sugar. ? Full-fat dairy products.  Do not salt foods before eating.  Try to eat at least 2 vegetarian meals each week.  Eat more home-cooked food and less restaurant, buffet, and fast food.  When eating at a restaurant, ask that your food be prepared with less salt or no salt, if possible.  What foods are recommended? The items listed may not be a complete list. Talk with your dietitian about what dietary choices are best for you. Grains Whole-grain or whole-wheat bread. Whole-grain or whole-wheat pasta. Brown rice. [place]. Bulgur. Whole-grain and low-sodium cereals. Pita bread. Low-fat, low-sodium crackers. Whole-wheat flour tortillas. Vegetables Fresh or frozen vegetables (raw, steamed, roasted, or grilled). Low-sodium or reduced-sodium tomato and vegetable juice. Low-sodium or reduced-sodium tomato sauce and tomato paste. Low-sodium or reduced-sodium canned  vegetables. Fruits All fresh, dried, or frozen fruit. Canned fruit in natural juice (without added sugar). Meat and other protein foods Skinless chicken or [place]. Ground chicken or [place]. Pork with fat trimmed off. Fish and seafood. Egg whites. Dried beans, peas, or lentils. Unsalted nuts, nut butters, and seeds. Unsalted canned beans. Lean cuts of beef with fat trimmed off. Low-sodium, lean deli meat. Dairy Low-fat (1%) or fat-free (skim) milk. Fat-free, low-fat, or reduced-fat cheeses. Nonfat, low-sodium ricotta or cottage cheese. Low-fat or nonfat yogurt. Low-fat, low-sodium cheese. Fats and oils Soft margarine without trans fats. Vegetable oil. Low-fat, reduced-fat, or light mayonnaise and salad dressings (reduced-sodium). Canola, safflower, olive, soybean, and sunflower oils. Avocado. Seasoning and other foods Herbs. Spices. Seasoning mixes without salt. Unsalted popcorn and pretzels. Fat-free sweets. What foods are not recommended? The items listed may not be a complete list. Talk with your dietitian about what dietary choices are best for you. Grains Baked goods made with fat, such as croissants, muffins, or some breads. Dry pasta or rice meal packs. Vegetables Creamed or fried vegetables. Vegetables in a cheese sauce. Regular canned vegetables (not low-sodium or reduced-sodium). Regular canned tomato sauce and paste (not low-sodium or reduced-sodium). Regular tomato and vegetable juice (not low-sodium or reduced-sodium). Angie Fava. Olives. Fruits Canned fruit in a light or heavy syrup. Fried fruit. Fruit in cream or butter sauce. Meat and other protein foods Fatty cuts of meat. Ribs. Fried meat. Berniece Salines. Sausage. Bologna and other processed lunch meats. Salami. Fatback. Hotdogs. Bratwurst. Salted nuts and seeds. Canned beans with added salt. Canned or smoked fish. Whole eggs or egg yolks. Chicken or [place] with skin. Dairy Whole or 2% milk, cream, and half-and-half. Whole or full-fat  cream cheese. Whole-fat or sweetened yogurt. Full-fat cheese. Nondairy creamers. Whipped toppings. Processed cheese and cheese spreads. Fats and oils Butter. Stick margarine. Lard. Shortening. Ghee. Bacon fat. Tropical oils, such as coconut, palm kernel, or palm oil. Seasoning and other foods Salted popcorn and pretzels. Onion salt, garlic salt, seasoned salt, table salt, and sea salt. Worcestershire sauce. Tartar sauce. Barbecue sauce. Teriyaki sauce. Soy sauce, including reduced-sodium. Steak sauce. Canned and packaged gravies. Fish sauce. Oyster sauce. Cocktail sauce. Horseradish that you find on the shelf. Ketchup. Mustard. Meat flavorings and tenderizers. Bouillon cubes. Hot sauce and Tabasco sauce. Premade or packaged marinades. Premade or packaged taco seasonings. Relishes. Regular salad dressings. Where to find more information:  National Heart, Lung, and [place]: https://wilson-eaton.com/  American Heart Association: www.heart.org Summary  The DASH eating plan is a healthy eating plan that has been shown to reduce high blood pressure (hypertension). It may also reduce your risk for type 2 diabetes, heart disease, and stroke.  With the DASH eating plan, you should limit salt (sodium) intake to 2,300 mg a day. If you have hypertension, you may need to reduce your sodium intake to 1,500 mg a day.  When on the DASH eating plan, aim to eat more fresh fruits and vegetables, whole grains, lean proteins, low-fat dairy, and heart-healthy fats.  Work  with your health care provider or diet and nutrition specialist (dietitian) to adjust your eating plan to your individual calorie needs. This information is not intended to replace advice given to you by your health care provider. Make sure you discuss any questions you have with your health care provider. Document Revised: 05/05/2017 Document Reviewed: 05/16/2016 Elsevier Patient Education  2020 [employer].

## 2020-04-29 NOTE — Progress Notes (Signed)
Careteam: Patient Care Team: Gayland Curry, DO as PCP - General (Geriatric Medicine) Mammography, [facility] (Diagnostic Radiology) Jovita Gamma, MD as Consulting Physician (Neurosurgery) Wilford Corner, MD as Consulting Physician (Gastroenterology) Allyn Kenner, MD (Dermatology) Thornell Sartorius, MD (Otolaryngology)  PLACE OF SERVICE:  [place]  Advanced Directive information    Allergies  Allergen Reactions  . Beta Adrenergic Blockers Other (See Comments)    Certain beta blockers make me hoarse.  . Mobic [Meloxicam] Hypertension    Made blood pressure extremely high  . Sulfa Antibiotics Itching and Swelling  . Tape Rash    Paper tape  . Symbicort [Budesonide-Formoterol Fumarate] Cough    Coughing up blood   . Aspirin Other (See Comments)    Stomach burning; increased acid  . Oxycodone Other (See Comments)    It makes her someone she's not     Chief Complaint  Patient presents with  . Follow-up    4 week blood pressure follow-up      HPI: Patient is a 74 y.o. female for follow up on blood pressure.  Got a new blood pressure cuff that fits better Started norvasc 5 mg daily- having a little swelling her ankle. Keeping a close eye on this. Only a little swelling.   Did not start cymbalta because she was having procedure on her hand and was taking more medication with that.  Feels like she may still need it but has not started. Feels like she is able to cope okay at this time.   S/p carpel tunnel release on the left by Dr Annie Main. Has dressing on today. Not needing anything for pain   Review of Systems:  Review of Systems  Constitutional: Negative for chills, fever and weight loss.  Respiratory: Negative for shortness of breath.   Cardiovascular: Negative for chest pain and leg swelling.  Musculoskeletal: Negative for myalgias.  Neurological: Negative for dizziness and headaches.  Psychiatric/Behavioral: The patient is nervous/anxious.     Past Medical  History:  Diagnosis Date  . Arthritis   . Asthma    " allergies"  . Chronic back pain   . Complication of anesthesia 1968   "fought the anesthesia" problems with memory 05/06/15 at [facility]"  . Depression   . Fibromyalgia   . GERD (gastroesophageal reflux disease)   . History of hiatal hernia   . Hypertension   . Hypothyroidism   . PONV (postoperative nausea and vomiting)   . Thyroid disease   . Torn meniscus    left knee  . Wears glasses    Past Surgical History:  Procedure Laterality Date  . ABDOMINAL HYSTERECTOMY  1976  . ANTERIOR CERVICAL DECOMP/DISCECTOMY FUSION N/A 05/06/2015   Procedure: Cervical four-five, Cervical five-six, Cervical six-seven Anterior cervical decompression/diskectomy/fusion;  Surgeon: Leeroy Cha, MD;  Location: [facility];  Service: Neurosurgery;  Laterality: N/A;  C4-5 C5-6 C6-7 Anterior cervical decompression/diskectomy/fusion  . APPENDECTOMY    . BACK SURGERY    . BREAST LUMPECTOMY WITH RADIOACTIVE SEED LOCALIZATION Right 11/15/2019   Procedure: RIGHT BREAST LUMPECTOMY WITH RADIOACTIVE SEED LOCALIZATION;  Surgeon: Jovita Kussmaul, MD;  Location: [facility];  Service: General;  Laterality: Right;  . BREAST SURGERY Bilateral    biopsy- 2 different times  . CARPAL TUNNEL RELEASE     B/L  . CHOLECYSTECTOMY    . COLONOSCOPY  2014  . COLONOSCOPY W/ POLYPECTOMY    . dental implants    . DILATION AND CURETTAGE OF UTERUS  [place] ARTHROSCOPY Right    right  . FOOT GANGLION EXCISION Left    left  . KNEE ARTHROSCOPY Right    torn ligament  . LAMINECTOMY     L 4 and 5; 2 separate surgeries  . left hand     carpal tunel  . right hand     3 surgeries- carpal tunel   Social History:   reports that she has never smoked. She has never used smokeless tobacco. She reports that she does not drink alcohol and does not use drugs.  Family History  Problem Relation Age of Onset  . Hypertension Mother   . Lupus Mother   . Heart  attack Mother   . Cancer Mother   . Stroke Mother   . Hypertension Father   . Heart disease Father   . Lupus Father   . Heart attack Father   . Heart attack Brother   . Leukemia Brother   . HIV/AIDS Sister   . Leukemia Brother   . Cancer Sister        intestinal  . Lupus Sister   . Breast cancer Sister   . Diabetes Daughter   . Diabetes Daughter   . Fibromyalgia Daughter   . Colon cancer Neg Hx     Medications: Patient's Medications  New Prescriptions   No medications on file  Previous Medications   AMLODIPINE (NORVASC) 5 MG TABLET    Take 1 tablet (5 mg total) by mouth daily.   ANASTROZOLE (ARIMIDEX) 1 MG TABLET    Take 1 mg by mouth daily.   CALCIUM CARB-CHOLECALCIFEROL (CALCIUM/VITAMIN D) 600-400 MG-UNIT TABS    Take 1 tablet by mouth in the morning and at bedtime.   CYANOCOBALAMIN 2000 MCG TABLET    Take 2,000 mcg by mouth daily.   CYCLOBENZAPRINE (FLEXERIL) 10 MG TABLET    TAKE 1/2 TO 1 TABLET UP TO TWICE DAILY AS NEEDED   DULOXETINE (CYMBALTA) 30 MG CAPSULE    Take 1 capsule (30 mg total) by mouth daily.   ESTAZOLAM (PROSOM) 2 MG TABLET    Take one tablet by mouth at bedtime as needed for muscle pain and insomnia   GLUCOSAMINE 500 MG CAPS    Take by mouth.   LEVOTHYROXINE (SYNTHROID) 50 MCG TABLET    TAKE 1 TABLET EVERY MORNING   LOSARTAN (COZAAR) 100 MG TABLET    Take 1 tablet (100 mg total) by mouth daily.   METOPROLOL SUCCINATE (TOPROL-XL) 100 MG 24 HR TABLET    Take 1 tablet (100 mg total) by mouth daily. Take with or immediately following a meal.   MOMETASONE (NASONEX) 50 MCG/ACT NASAL SPRAY    Place 2 sprays into the nose daily as needed (allergies).   PANTOPRAZOLE (PROTONIX) 40 MG TABLET    TAKE 1 TABLET EVERY DAY  Modified Medications   No medications on file  Discontinued Medications   BUDESONIDE-FORMOTEROL (SYMBICORT) 160-4.5 MCG/ACT INHALER    Inhale 2 puffs into the lungs 2 (two) times daily.   CALCIUM GLUCONATE 500 MG TABLET    Take 1 tablet by mouth 3  (three) times daily.   CHOLECALCIFEROL (D3 SUPER STRENGTH) 2000 UNITS CAPS    Take 2,000 Units by mouth daily.     Physical Exam:  Vitals:   04/29/20 1140  BP: 138/80  Pulse: 67  Temp: (!) 96.8 F (36 C)  TempSrc: Temporal  Weight: 171 lb (77.6 kg)  Height: 5' (1.524 m)   Body mass  index is 33.4 kg/m. Wt Readings from Last 3 Encounters:  04/29/20 171 lb (77.6 kg)  04/02/20 173 lb (78.5 kg)  03/02/20 170 lb 9.6 oz (77.4 kg)    Physical Exam Constitutional:      General: She is not in acute distress.    Appearance: She is well-developed. She is not diaphoretic.  HENT:     Head: Normocephalic and atraumatic.  Cardiovascular:     Rate and Rhythm: Normal rate and regular rhythm.     Heart sounds: Normal heart sounds.  Pulmonary:     Effort: Pulmonary effort is normal.     Breath sounds: Normal breath sounds.  Musculoskeletal:        General: No tenderness.     Cervical back: Normal range of motion and neck supple.  Skin:    General: Skin is warm and dry.  Neurological:     Mental Status: She is alert and oriented to person, place, and time.  Psychiatric:        Mood and Affect: Mood normal.        Behavior: Behavior normal.     Labs reviewed: Basic Metabolic Panel: Recent Labs    07/05/19 1206 03/02/20 1403  NA 139 138  K 4.4 4.2  CL 105 106  CO2 27 23  GLUCOSE 96 95  BUN 14 14  CREATININE 0.90 0.97*  CALCIUM 9.9 9.8  TSH 1.46 1.78   Liver Function Tests: Recent Labs    07/05/19 1206 03/02/20 1403  AST 22 23  ALT 20 20  BILITOT 0.4 0.5  PROT 6.8 6.5   No results for input(s): LIPASE, AMYLASE in the last 8760 hours. No results for input(s): AMMONIA in the last 8760 hours. CBC: Recent Labs    07/05/19 1206 03/02/20 1403  WBC 5.5 3.9  NEUTROABS 2,833 2,254  HGB 11.1* 13.0  HCT 35.0 40.5  MCV 77.6* 86.5  PLT 351 277   Lipid Panel: Recent Labs    07/05/19 1206 03/02/20 1403  CHOL 173 176  HDL 71 61  LDLCALC 83 92  TRIG 101 136   CHOLHDL 2.4 2.9   TSH: Recent Labs    07/05/19 1206 03/02/20 1403  TSH 1.46 1.78   A1C: Lab Results  Component Value Date   HGBA1C 5.7 (H) 03/02/2020     Assessment/Plan 1. Essential hypertension -controlled at this time. -continue to work on low sodium diet  -continue norvasc, losartan and metoprolol. - amLODipine (NORVASC) 5 MG tablet; Take 1 tablet (5 mg total) by mouth daily.  Dispense: 90 tablet; Refill: 1  2. Anxiety Has not started cymbalta andFeels like she is coping effectively at this time but may decide to start cymbalta. Will leave on list and have this re-evaluated at next follow up with Dr Mariea Clonts.   Next appt: 07/02/2020 with Dr Sharee Holster [place]. [place], [facility] [phone]

## 2020-05-04 DIAGNOSIS — J4520 Mild intermittent asthma, uncomplicated: Secondary | ICD-10-CM | POA: Diagnosis not present

## 2020-05-04 DIAGNOSIS — M25641 Stiffness of right hand, not elsewhere classified: Secondary | ICD-10-CM | POA: Diagnosis not present

## 2020-05-13 DIAGNOSIS — H52223 Regular astigmatism, bilateral: Secondary | ICD-10-CM | POA: Diagnosis not present

## 2020-05-13 DIAGNOSIS — H5203 Hypermetropia, bilateral: Secondary | ICD-10-CM | POA: Diagnosis not present

## 2020-05-13 DIAGNOSIS — H524 Presbyopia: Secondary | ICD-10-CM | POA: Diagnosis not present

## 2020-05-13 DIAGNOSIS — H25813 Combined forms of age-related cataract, bilateral: Secondary | ICD-10-CM | POA: Diagnosis not present

## 2020-05-27 ENCOUNTER — Other Ambulatory Visit: Payer: Self-pay | Admitting: Internal Medicine

## 2020-05-28 DIAGNOSIS — J383 Other diseases of vocal cords: Secondary | ICD-10-CM | POA: Diagnosis not present

## 2020-05-28 DIAGNOSIS — J370 Chronic laryngitis: Secondary | ICD-10-CM | POA: Diagnosis not present

## 2020-05-28 DIAGNOSIS — J45909 Unspecified asthma, uncomplicated: Secondary | ICD-10-CM | POA: Diagnosis not present

## 2020-06-04 DIAGNOSIS — J383 Other diseases of vocal cords: Secondary | ICD-10-CM | POA: Diagnosis not present

## 2020-06-04 DIAGNOSIS — J370 Chronic laryngitis: Secondary | ICD-10-CM | POA: Diagnosis not present

## 2020-06-04 DIAGNOSIS — J45909 Unspecified asthma, uncomplicated: Secondary | ICD-10-CM | POA: Diagnosis not present

## 2020-06-15 ENCOUNTER — Other Ambulatory Visit: Payer: Self-pay

## 2020-06-15 MED ORDER — CYCLOBENZAPRINE HCL 10 MG PO TABS: ORAL_TABLET | ORAL | 2 refills | Status: DC

## 2020-06-15 NOTE — Telephone Encounter (Signed)
Refill request received from [facility] for cyclobenzaprine (flexeril) 10 mg tablet. Medication pended and sent to Dr. Mariea Clonts for approval.

## 2020-06-16 DIAGNOSIS — Z923 Personal history of irradiation: Secondary | ICD-10-CM | POA: Diagnosis not present

## 2020-06-16 DIAGNOSIS — R490 Dysphonia: Secondary | ICD-10-CM | POA: Diagnosis not present

## 2020-06-16 DIAGNOSIS — Z170 Estrogen receptor positive status [ER+]: Secondary | ICD-10-CM | POA: Diagnosis not present

## 2020-06-16 DIAGNOSIS — Z79811 Long term (current) use of aromatase inhibitors: Secondary | ICD-10-CM | POA: Diagnosis not present

## 2020-06-16 DIAGNOSIS — C50411 Malignant neoplasm of upper-outer quadrant of right female breast: Secondary | ICD-10-CM | POA: Diagnosis not present

## 2020-06-17 DIAGNOSIS — J370 Chronic laryngitis: Secondary | ICD-10-CM | POA: Diagnosis not present

## 2020-06-17 DIAGNOSIS — K219 Gastro-esophageal reflux disease without esophagitis: Secondary | ICD-10-CM | POA: Diagnosis not present

## 2020-06-17 DIAGNOSIS — J383 Other diseases of vocal cords: Secondary | ICD-10-CM | POA: Diagnosis not present

## 2020-06-19 ENCOUNTER — Other Ambulatory Visit: Payer: Self-pay | Admitting: Internal Medicine

## 2020-06-19 DIAGNOSIS — M797 Fibromyalgia: Secondary | ICD-10-CM

## 2020-06-19 NOTE — Telephone Encounter (Signed)
Patient has request a refill for medication "Estazolam 2 mg". Patient last refill was 03/30/2020. Medication pend and sent to PCP Mariea Clonts, Tiffany L, DO . Please Advise.

## 2020-07-02 ENCOUNTER — Ambulatory Visit (INDEPENDENT_AMBULATORY_CARE_PROVIDER_SITE_OTHER): Payer: Medicare HMO | Admitting: Internal Medicine

## 2020-07-02 ENCOUNTER — Encounter: Payer: Self-pay | Admitting: Internal Medicine

## 2020-07-02 ENCOUNTER — Other Ambulatory Visit: Payer: Self-pay

## 2020-07-02 VITALS — BP 136/82 | HR 65 | Temp 97.8°F | Ht 61.0 in | Wt 175.2 lb

## 2020-07-02 DIAGNOSIS — J370 Chronic laryngitis: Secondary | ICD-10-CM | POA: Diagnosis not present

## 2020-07-02 DIAGNOSIS — I10 Essential (primary) hypertension: Secondary | ICD-10-CM

## 2020-07-02 DIAGNOSIS — Z170 Estrogen receptor positive status [ER+]: Secondary | ICD-10-CM

## 2020-07-02 DIAGNOSIS — Z79811 Long term (current) use of aromatase inhibitors: Secondary | ICD-10-CM | POA: Diagnosis not present

## 2020-07-02 DIAGNOSIS — F419 Anxiety disorder, unspecified: Secondary | ICD-10-CM | POA: Diagnosis not present

## 2020-07-02 DIAGNOSIS — Z636 Dependent relative needing care at home: Secondary | ICD-10-CM | POA: Diagnosis not present

## 2020-07-02 DIAGNOSIS — C50411 Malignant neoplasm of upper-outer quadrant of right female breast: Secondary | ICD-10-CM

## 2020-07-02 DIAGNOSIS — K219 Gastro-esophageal reflux disease without esophagitis: Secondary | ICD-10-CM | POA: Diagnosis not present

## 2020-07-02 NOTE — Progress Notes (Signed)
Location:  [facility] clinic Provider:  Rejina Odle L. Mariea Clonts, D.O., C.M.D.  Goals of Care:  Advanced Directives 07/02/2020  Does Patient Have a Medical Advance Directive? Yes  Type of Advance Directive [place]  Does patient want to make changes to medical advance directive? No - Patient declined  Copy of [place] in Chart? Yes - validated most recent copy scanned in chart (See row information)   Chief Complaint  Patient presents with  . Medical Management of Chronic Issues    4 month follow up. Discuss legs swelling due to  blood pressure medication.  Marland Kitchen Health Maintenance    Dexa Scan     HPI: Patient is a 75 y.o. female seen today for medical management of chronic diseases.    Cymbalta made her too sleepy.  Slept most of the time.  She also kept eating ice cream--a gallon one week and a 1/2 gallon the next week.  She quit since.  She was to the ENT and she put her on a diet off spicy foods, coffee, etc due to concerns that the reflux was causing most of the damage.  Dairy products are not supposed to be taken either.  Concerned about leg swelling.  BP has been running good except her pulse runs higher now in the 80s.  Systolic is usually 811B-147W.  If she sits with her feet down, she gets swelling and warmth.  There's a little increased pigment in the ankles.  She knows the amlodipine could be contributing.  She is tolerating toprol xl ok.  Takes it at night due to fatigue.  Had her breast surgery and radiation.  On arimidex.  Needs bone density.  Stopped working with her grandchildren two days per week, but she noticed Quillian Quince couldn't be left alone anymore.  He does not go out anywhere.  Getting him ready is challenging--he's not motivated.  He could not turn off the tv with the remote last night.    Past Medical History:  Diagnosis Date  . Arthritis   . Asthma    " allergies"  . Chronic back pain   . Complication of anesthesia 1968   "fought  the anesthesia" problems with memory 05/06/15 at [facility]"  . Depression   . Fibromyalgia   . GERD (gastroesophageal reflux disease)   . History of hiatal hernia   . Hypertension   . Hypothyroidism   . PONV (postoperative nausea and vomiting)   . Thyroid disease   . Torn meniscus    left knee  . Wears glasses     Past Surgical History:  Procedure Laterality Date  . ABDOMINAL HYSTERECTOMY  1976  . ANTERIOR CERVICAL DECOMP/DISCECTOMY FUSION N/A 05/06/2015   Procedure: Cervical four-five, Cervical five-six, Cervical six-seven Anterior cervical decompression/diskectomy/fusion;  Surgeon: Leeroy Cha, MD;  Location: [place] [facility];  Service: Neurosurgery;  Laterality: N/A;  C4-5 C5-6 C6-7 Anterior cervical decompression/diskectomy/fusion  . APPENDECTOMY    . BACK SURGERY    . BREAST LUMPECTOMY WITH RADIOACTIVE SEED LOCALIZATION Right 11/15/2019   Procedure: RIGHT BREAST LUMPECTOMY WITH RADIOACTIVE SEED LOCALIZATION;  Surgeon: Jovita Kussmaul, MD;  Location: [facility];  Service: General;  Laterality: Right;  . BREAST SURGERY Bilateral    biopsy- 2 different times  . CARPAL TUNNEL RELEASE     B/L  . CARPAL TUNNEL RELEASE Left 04/15/2020  . CHOLECYSTECTOMY    . COLONOSCOPY  2014  . COLONOSCOPY W/ POLYPECTOMY    . dental implants    .  DILATION AND CURETTAGE OF UTERUS  1968  . ELBOW ARTHROSCOPY Right    right  . FOOT GANGLION EXCISION Left    left  . KNEE ARTHROSCOPY Right    torn ligament  . LAMINECTOMY     L 4 and 5; 2 separate surgeries  . left hand     carpal tunel  . right hand     3 surgeries- carpal tunel    Allergies  Allergen Reactions  . Beta Adrenergic Blockers Other (See Comments)    Certain beta blockers make me hoarse.  . Mobic [Meloxicam] Hypertension    Made blood pressure extremely high  . Sulfa Antibiotics Itching and Swelling  . Tape Rash    Paper tape  . Symbicort [Budesonide-Formoterol Fumarate] Cough    Coughing up blood   .  Aspirin Other (See Comments)    Stomach burning; increased acid  . Oxycodone Other (See Comments)    It makes her someone she's not     Outpatient Encounter Medications as of 07/02/2020  Medication Sig  . amLODipine (NORVASC) 5 MG tablet Take 1 tablet (5 mg total) by mouth daily.  Marland Kitchen anastrozole (ARIMIDEX) 1 MG tablet Take 1 mg by mouth daily.  . Calcium Carb-Cholecalciferol (CALCIUM/VITAMIN D) 600-400 MG-UNIT TABS Take 1 tablet by mouth in the morning and at bedtime.  . cyanocobalamin 2000 MCG tablet Take 2,000 mcg by mouth daily.  . cyclobenzaprine (FLEXERIL) 10 MG tablet TAKE 1/2 TO 1 TABLET UP TO TWICE DAILY AS NEEDED  . estazolam (PROSOM) 2 MG tablet Take 1 tablet by mouth as needed at bedtime for muscle pain and insomnia  . Glucosamine 500 MG CAPS Take by mouth.  . levothyroxine (SYNTHROID) 50 MCG tablet TAKE 1 TABLET EVERY MORNING  . losartan (COZAAR) 100 MG tablet Take 1 tablet (100 mg total) by mouth daily.  . metoprolol succinate (TOPROL-XL) 100 MG 24 hr tablet Take 1 tablet (100 mg total) by mouth daily. Take with or immediately following a meal.  . mometasone (NASONEX) 50 MCG/ACT nasal spray Place 2 sprays into the nose daily as needed (allergies).  . [DISCONTINUED] DULoxetine (CYMBALTA) 30 MG capsule Take 1 capsule (30 mg total) by mouth daily. (Patient not taking: Reported on 04/29/2020)  . [DISCONTINUED] pantoprazole (PROTONIX) 40 MG tablet TAKE 1 TABLET EVERY DAY   No facility-administered encounter medications on file as of 07/02/2020.    Review of Systems:  Review of Systems  Constitutional: Positive for malaise/fatigue. Negative for chills and fever.  HENT: Negative for congestion.        Hoarseness felt to be GERD after an extensive workup thru ENT initiated by another specialist in another system   Respiratory: Positive for cough. Negative for shortness of breath.   Cardiovascular: Positive for leg swelling. Negative for chest pain and palpitations.   Gastrointestinal: Negative for abdominal pain and constipation.       She doesn't think she has gerd  Genitourinary: Negative for dysuria.  Musculoskeletal: Positive for myalgias. Negative for falls.  Skin: Negative for itching and rash.  Neurological: Negative for dizziness and loss of consciousness.  Psychiatric/Behavioral: Positive for depression. Negative for memory loss and suicidal ideas. The patient is nervous/anxious.        Caregiver stress and current grief    Health Maintenance  Topic Date Due  . DEXA SCAN  Never done  . COVID-19 Vaccine (4 - Booster for Pfizer series) 10/07/2020  . TETANUS/TDAP  12/10/2020  . COLONOSCOPY (Pts 45-78yrs Insurance coverage will  need to be confirmed)  06/06/2024  . INFLUENZA VACCINE  Completed  . Hepatitis C Screening  Completed  . PNA vac Low Risk Adult  Completed    Physical Exam: Vitals:   07/02/20 1113  BP: 136/82  Pulse: 65  Temp: 97.8 F (36.6 C)  SpO2: 97%  Weight: 175 lb 3.2 oz (79.5 kg)  Height: 5\' 1"  (1.549 m)   Body mass index is 33.1 kg/m. Physical Exam Vitals reviewed.  Constitutional:      Appearance: Normal appearance.  HENT:     Head: Normocephalic and atraumatic.  Eyes:     Extraocular Movements: Extraocular movements intact.     Pupils: Pupils are equal, round, and reactive to light.     Comments: glasses  Cardiovascular:     Rate and Rhythm: Normal rate and regular rhythm.     Pulses: Normal pulses.     Heart sounds: Normal heart sounds.  Pulmonary:     Effort: Pulmonary effort is normal.     Breath sounds: Normal breath sounds. No wheezing, rhonchi or rales.  Abdominal:     General: Bowel sounds are normal.     Palpations: Abdomen is soft.     Tenderness: There is no abdominal tenderness.  Musculoskeletal:        General: Normal range of motion.     Comments: very minimal nonpitting edema of ankles  Neurological:     General: No focal deficit present.     Mental Status: She is alert and  oriented to person, place, and time.     Gait: Gait normal.  Psychiatric:     Comments: Extra anxious and jittery today (speaking at funeral later)     Labs reviewed: Basic Metabolic Panel: Recent Labs    03/02/20 1403  NA 138  K 4.2  CL 106  CO2 23  GLUCOSE 95  BUN 14  CREATININE 0.97*  CALCIUM 9.8  TSH 1.78   Liver Function Tests: Recent Labs    03/02/20 1403  AST 23  ALT 20  BILITOT 0.5  PROT 6.5   No results for input(s): LIPASE, AMYLASE in the last 8760 hours. No results for input(s): AMMONIA in the last 8760 hours. CBC: Recent Labs    03/02/20 1403  WBC 3.9  NEUTROABS 2,254  HGB 13.0  HCT 40.5  MCV 86.5  PLT 277   Lipid Panel: Recent Labs    03/02/20 1403  CHOL 176  HDL 61  LDLCALC 92  TRIG 136  CHOLHDL 2.9   Lab Results  Component Value Date   HGBA1C 5.7 (H) 03/02/2020    Procedures since last visit: No results found.  Assessment/Plan 1. Essential hypertension -bp improved on recheck today -cont home monitoring -today was NOT a good day to be checking her bp with the stress of a funeral   2. Anxiety -long-term and worsened by #3 and death of a close friend she helped care for -tried her on cymbalta but did not tolerate (has fibromyalgia and on flexeril and prosom that she came to me on and I've been unable to get weaned)  3. Caregiver stress -is significant -is now staying home with her husband with dementia (noted more changes while home with him as she got her breast cancer treatments) -she cannot get him to leave the house to go to a respite program  4. Chronic laryngitis -ENT felt GERD, but pt does not have overt indigestion so she does not believe this and not taking protonix  5. Gastroesophageal reflux disease, unspecified whether esophagitis present -she stopped the protonix b/c she does not believe gerd is causing her hoarseness -cont nasonex for allergies/congestion  6. Malignant neoplasm of upper-outer quadrant of  right breast in female, estrogen receptor positive ([place]) -cont arimidex, tolerating ok -is s/p lumpectomy and XRT  7. Aromatase inhibitor use - realized after visit that she's not had this done - DG Bone Density; Future  Labs/tests ordered:   Lab Orders  No laboratory test(s) ordered today   Next appt:  4 mos med mgt  Caroly Purewal L. Tabor Bartram, D.O. [facility] [address], [place] [address] Cell Phone (Mon-Fri 8am-5pm):  [phone] On Call:  [phone] & follow prompts after 5pm & weekends Office Phone:  [phone] Office Fax:  [phone]

## 2020-07-10 ENCOUNTER — Other Ambulatory Visit: Payer: Self-pay | Admitting: Internal Medicine

## 2020-07-10 DIAGNOSIS — Z79811 Long term (current) use of aromatase inhibitors: Secondary | ICD-10-CM

## 2020-07-10 DIAGNOSIS — Z780 Asymptomatic menopausal state: Secondary | ICD-10-CM

## 2020-07-27 ENCOUNTER — Encounter: Payer: Self-pay | Admitting: Internal Medicine

## 2020-08-12 ENCOUNTER — Telehealth: Payer: Self-pay | Admitting: Nurse Practitioner

## 2020-08-12 NOTE — Telephone Encounter (Signed)
Called both numbers to get scheduled for AWV and left messages to call in to get scheduled.

## 2020-08-17 ENCOUNTER — Other Ambulatory Visit: Payer: Self-pay

## 2020-08-17 ENCOUNTER — Encounter: Payer: Self-pay | Admitting: Internal Medicine

## 2020-08-17 ENCOUNTER — Ambulatory Visit (INDEPENDENT_AMBULATORY_CARE_PROVIDER_SITE_OTHER): Payer: Medicare HMO | Admitting: Internal Medicine

## 2020-08-17 VITALS — BP 140/90 | HR 66 | Temp 97.8°F | Ht 61.0 in | Wt 178.0 lb

## 2020-08-17 DIAGNOSIS — E039 Hypothyroidism, unspecified: Secondary | ICD-10-CM | POA: Diagnosis not present

## 2020-08-17 DIAGNOSIS — Z636 Dependent relative needing care at home: Secondary | ICD-10-CM

## 2020-08-17 DIAGNOSIS — C50411 Malignant neoplasm of upper-outer quadrant of right female breast: Secondary | ICD-10-CM

## 2020-08-17 DIAGNOSIS — J370 Chronic laryngitis: Secondary | ICD-10-CM | POA: Diagnosis not present

## 2020-08-17 DIAGNOSIS — E7800 Pure hypercholesterolemia, unspecified: Secondary | ICD-10-CM

## 2020-08-17 DIAGNOSIS — Z170 Estrogen receptor positive status [ER+]: Secondary | ICD-10-CM

## 2020-08-17 DIAGNOSIS — I10 Essential (primary) hypertension: Secondary | ICD-10-CM | POA: Diagnosis not present

## 2020-08-17 DIAGNOSIS — R300 Dysuria: Secondary | ICD-10-CM

## 2020-08-17 LAB — POCT URINALYSIS DIPSTICK
Bilirubin, UA: NEGATIVE
Glucose, UA: NEGATIVE
Ketones, UA: NEGATIVE
Nitrite, UA: NEGATIVE
Protein, UA: NEGATIVE
Spec Grav, UA: 1.01 (ref 1.010–1.025)
Urobilinogen, UA: 0.2 E.U./dL
pH, UA: 6.5 (ref 5.0–8.0)

## 2020-08-17 NOTE — Progress Notes (Signed)
Let's send off the urine culture.

## 2020-08-17 NOTE — Progress Notes (Signed)
Location:  [facility] clinic Provider:  Antonieta Slaven L. Mariea Clonts, D.O., C.M.D.  Goals of Care:  Advanced Directives 08/17/2020  Does Patient Have a Medical Advance Directive? Yes  Type of Advance Directive [place]  Does patient want to make changes to medical advance directive? No - Patient declined  Copy of [place] in Chart? Yes - validated most recent copy scanned in chart (See row information)     Chief Complaint  Patient presents with  . Medical Management of Chronic Issues    3 month follow up.Patient would like urine checked. She has been having some burning and dryness.    HPI: Patient is a 75 y.o. female seen today for medical management of chronic diseases.    She says she's doing ok but thinks she has a UTI.  It's burning sometimes when she urinates.  She has not had a pap smear recently--canceled it last year.  Her last pap smear was in her early 8s.  Her mother had cervical ca but she was in her 47s when she had that.  She thinks maybe it's dryness down there.  Uses olive oil.  She is also now on anastrazole newly.  Soap makes it burn like fire.    She has nausea sometimes in the am--uses zofran odt 2 times from anastrazole too.   Has to steer clear of foods that bring on her laryngitis/gerd--ice cream is the worst.    Past Medical History:  Diagnosis Date  . Arthritis   . Asthma    " allergies"  . Chronic back pain   . Complication of anesthesia 1968   "fought the anesthesia" problems with memory 05/06/15 at [facility]"  . Depression   . Fibromyalgia   . GERD (gastroesophageal reflux disease)   . History of hiatal hernia   . Hypertension   . Hypothyroidism   . PONV (postoperative nausea and vomiting)   . Thyroid disease   . Torn meniscus    left knee  . Wears glasses     Past Surgical History:  Procedure Laterality Date  . ABDOMINAL HYSTERECTOMY  1976  . ANTERIOR CERVICAL DECOMP/DISCECTOMY FUSION N/A 05/06/2015   Procedure:  Cervical four-five, Cervical five-six, Cervical six-seven Anterior cervical decompression/diskectomy/fusion;  Surgeon: Leeroy Cha, MD;  Location: [place] [facility];  Service: Neurosurgery;  Laterality: N/A;  C4-5 C5-6 C6-7 Anterior cervical decompression/diskectomy/fusion  . APPENDECTOMY    . BACK SURGERY    . BREAST LUMPECTOMY WITH RADIOACTIVE SEED LOCALIZATION Right 11/15/2019   Procedure: RIGHT BREAST LUMPECTOMY WITH RADIOACTIVE SEED LOCALIZATION;  Surgeon: Jovita Kussmaul, MD;  Location: [place];  Service: General;  Laterality: Right;  . BREAST SURGERY Bilateral    biopsy- 2 different times  . CARPAL TUNNEL RELEASE     B/L  . CARPAL TUNNEL RELEASE Left 04/15/2020  . CHOLECYSTECTOMY    . COLONOSCOPY  2014  . COLONOSCOPY W/ POLYPECTOMY    . dental implants    . DILATION AND CURETTAGE OF UTERUS  1968  . ELBOW ARTHROSCOPY Right    right  . FOOT GANGLION EXCISION Left    left  . KNEE ARTHROSCOPY Right    torn ligament  . LAMINECTOMY     L 4 and 5; 2 separate surgeries  . left hand     carpal tunel  . right hand     3 surgeries- carpal tunel    Allergies  Allergen Reactions  . Beta Adrenergic Blockers Other (See Comments)  Certain beta blockers make me hoarse.  . Mobic [Meloxicam] Hypertension    Made blood pressure extremely high  . Sulfa Antibiotics Itching and Swelling  . Tape Rash    Paper tape  . Symbicort [Budesonide-Formoterol Fumarate] Cough    Coughing up blood   . Aspirin Other (See Comments)    Stomach burning; increased acid  . Oxycodone Other (See Comments)    It makes her someone she's not     Outpatient Encounter Medications as of 08/17/2020  Medication Sig  . amLODipine (NORVASC) 5 MG tablet Take 1 tablet (5 mg total) by mouth daily.  Marland Kitchen anastrozole (ARIMIDEX) 1 MG tablet Take 1 mg by mouth daily.  . Calcium Carb-Cholecalciferol (CALCIUM/VITAMIN D) 600-400 MG-UNIT TABS Take 1 tablet by mouth in the morning and at bedtime.  .  cyanocobalamin 2000 MCG tablet Take 2,000 mcg by mouth daily.  . cyclobenzaprine (FLEXERIL) 10 MG tablet TAKE 1/2 TO 1 TABLET UP TO TWICE DAILY AS NEEDED  . estazolam (PROSOM) 2 MG tablet Take 1 tablet by mouth as needed at bedtime for muscle pain and insomnia  . Glucosamine 500 MG CAPS Take by mouth.  . levothyroxine (SYNTHROID) 50 MCG tablet TAKE 1 TABLET EVERY MORNING  . losartan (COZAAR) 100 MG tablet Take 1 tablet (100 mg total) by mouth daily.  . metoprolol succinate (TOPROL-XL) 100 MG 24 hr tablet Take 1 tablet (100 mg total) by mouth daily. Take with or immediately following a meal.  . mometasone (NASONEX) 50 MCG/ACT nasal spray Place 2 sprays into the nose daily as needed (allergies).  . OMEPRAZOLE MAGNESIUM PO Take 1 capsule by mouth daily. 30 minutes before meal   No facility-administered encounter medications on file as of 08/17/2020.    Review of Systems:  Review of Systems  Constitutional: Negative for chills and fever.  HENT:       Hoarseness  Eyes: Negative for blurred vision.  Respiratory: Negative for shortness of breath.   Cardiovascular: Positive for leg swelling. Negative for chest pain and palpitations.  Gastrointestinal: Negative for abdominal pain, blood in stool, constipation, diarrhea and melena.  Genitourinary: Positive for dysuria. Negative for flank pain, frequency, hematuria and urgency.       Vaginal dryness, irritation  Musculoskeletal: Positive for myalgias. Negative for joint pain.  Skin: Negative for itching and rash.  Neurological: Negative for dizziness and loss of consciousness.  Psychiatric/Behavioral: Negative for memory loss. The patient is nervous/anxious.     Health Maintenance  Topic Date Due  . DEXA SCAN  Never done  . COVID-19 Vaccine (4 - Booster for Pfizer series) 10/07/2020  . TETANUS/TDAP  12/10/2020  . COLONOSCOPY (Pts 45-82yrs Insurance coverage will need to be confirmed)  06/06/2024  . INFLUENZA VACCINE  Completed  . Hepatitis C  Screening  Completed  . PNA vac Low Risk Adult  Completed  . HPV VACCINES  Aged Out    Physical Exam: Vitals:   08/17/20 1145  BP: 140/90  Pulse: 66  Temp: 97.8 F (36.6 C)  SpO2: 98%  Weight: 178 lb (80.7 kg)  Height: 5\' 1"  (1.549 m)   Body mass index is 33.63 kg/m. Physical Exam Vitals reviewed.  Constitutional:      General: She is not in acute distress.    Appearance: Normal appearance. She is not toxic-appearing.  HENT:     Head: Normocephalic and atraumatic.  Eyes:     Comments: glasses  Cardiovascular:     Rate and Rhythm: Normal rate and regular  rhythm.     Pulses: Normal pulses.     Heart sounds: Normal heart sounds.  Pulmonary:     Effort: Pulmonary effort is normal.     Breath sounds: Normal breath sounds.  Abdominal:     General: Bowel sounds are normal. There is no distension.     Palpations: Abdomen is soft.     Tenderness: There is no abdominal tenderness.  Musculoskeletal:        General: Normal range of motion.     Comments: 1+ pitting in shins but oddly not in ankles/feet  Skin:    General: Skin is warm and dry.  Neurological:     General: No focal deficit present.     Mental Status: She is alert and oriented to person, place, and time.  Psychiatric:        Mood and Affect: Mood normal.     Comments: Calmer today when here w/o her husband     Labs reviewed: Basic Metabolic Panel: Recent Labs    03/02/20 1403  NA 138  K 4.2  CL 106  CO2 23  GLUCOSE 95  BUN 14  CREATININE 0.97*  CALCIUM 9.8  TSH 1.78   Liver Function Tests: Recent Labs    03/02/20 1403  AST 23  ALT 20  BILITOT 0.5  PROT 6.5   No results for input(s): LIPASE, AMYLASE in the last 8760 hours. No results for input(s): AMMONIA in the last 8760 hours. CBC: Recent Labs    03/02/20 1403  WBC 3.9  NEUTROABS 2,254  HGB 13.0  HCT 40.5  MCV 86.5  PLT 277   Lipid Panel: Recent Labs    03/02/20 1403  CHOL 176  HDL 61  LDLCALC 92  TRIG 136  CHOLHDL 2.9    Lab Results  Component Value Date   HGBA1C 5.7 (H) 03/02/2020    Procedures since last visit: No results found.  Assessment/Plan 1. Dysuria -doubt UTI, but she requests we rule out - POC Urinalysis Dipstick only with wbc - Urine Culture -seems this is due to vaginal atrophy and anastrozole adding to it  2. Essential hypertension -bp better than sometimes here at the office -continue home checks, avoiding high sodium foods and stress reduction techniques like her night exercise and jewelry-making  3. Caregiver stress -due to husband's dementia -is trying to allow him to still do things for himself that he's able, but having to have more help come in now for manual work  4. Acquired hypothyroidism -cont current mgt and monitor Lab Results  Component Value Date   TSH 1.78 03/02/2020    5. Chronic laryngitis -combo of postnasal drip, gerd, followed by ENT  -cont to avoid trigger foods   6. Malignant neoplasm of upper-outer quadrant of right breast in female, estrogen receptor positive ([place]) -oncology note from [facility] reviewed indicating treatments were as follows: 1. Status post lumpectomy in June 2021 with final pathology showing 1.1 cm grade 1 invasive ductal carcinoma. ER +85% PR +90%. 2. S/P adjuvant radiation therapy by Dr. Vick Frees.  3. Initiation adjuvant endocrine therapy anastrozole 1 daily, 02/12/2020 -doing well outside of vaginal dryness, aches and pains with anastrazole  7. Hyperlipidemia  -LDL at goal of less than 100, continues regular exercise and stays very active  Labs/tests ordered:   Lab Orders     Urine Culture     POC Urinalysis Dipstick   Next appt:  12/16/2020 with Janett Billow  Maretta Overdorf L. Shakisha Abend, D.O. [facility] [employer]  [facility] [address], [place] [address] Cell Phone (Mon-Fri 8am-5pm):  [phone] On Call:  [phone] & follow prompts after 5pm & weekends Office Phone:   [phone] Office Fax:  [phone]

## 2020-08-18 ENCOUNTER — Encounter: Payer: Self-pay | Admitting: Internal Medicine

## 2020-08-19 LAB — URINE CULTURE
MICRO NUMBER:: 11646581
SPECIMEN QUALITY:: ADEQUATE

## 2020-08-19 NOTE — Progress Notes (Signed)
No UTI. I recommend using vaginal lubricants to help with dryness due to the anastrozole.

## 2020-08-22 ENCOUNTER — Other Ambulatory Visit: Payer: Self-pay | Admitting: Internal Medicine

## 2020-08-26 DIAGNOSIS — M542 Cervicalgia: Secondary | ICD-10-CM | POA: Diagnosis not present

## 2020-08-26 DIAGNOSIS — R030 Elevated blood-pressure reading, without diagnosis of hypertension: Secondary | ICD-10-CM | POA: Diagnosis not present

## 2020-08-26 DIAGNOSIS — Z6834 Body mass index (BMI) 34.0-34.9, adult: Secondary | ICD-10-CM | POA: Diagnosis not present

## 2020-08-26 DIAGNOSIS — S129XXA Fracture of neck, unspecified, initial encounter: Secondary | ICD-10-CM | POA: Diagnosis not present

## 2020-08-26 DIAGNOSIS — S129XXD Fracture of neck, unspecified, subsequent encounter: Secondary | ICD-10-CM | POA: Diagnosis not present

## 2020-08-27 ENCOUNTER — Other Ambulatory Visit: Payer: Self-pay | Admitting: Internal Medicine

## 2020-08-27 ENCOUNTER — Other Ambulatory Visit: Payer: Self-pay | Admitting: Nurse Practitioner

## 2020-08-27 DIAGNOSIS — I10 Essential (primary) hypertension: Secondary | ICD-10-CM

## 2020-09-14 DIAGNOSIS — R922 Inconclusive mammogram: Secondary | ICD-10-CM | POA: Diagnosis not present

## 2020-09-14 DIAGNOSIS — Z853 Personal history of malignant neoplasm of breast: Secondary | ICD-10-CM | POA: Diagnosis not present

## 2020-09-14 DIAGNOSIS — Z170 Estrogen receptor positive status [ER+]: Secondary | ICD-10-CM | POA: Diagnosis not present

## 2020-09-14 DIAGNOSIS — C50411 Malignant neoplasm of upper-outer quadrant of right female breast: Secondary | ICD-10-CM | POA: Diagnosis not present

## 2020-10-05 DIAGNOSIS — Z800 Family history of malignant neoplasm of digestive organs: Secondary | ICD-10-CM | POA: Diagnosis not present

## 2020-10-05 DIAGNOSIS — K219 Gastro-esophageal reflux disease without esophagitis: Secondary | ICD-10-CM | POA: Diagnosis not present

## 2020-10-07 ENCOUNTER — Encounter: Payer: Self-pay | Admitting: Nurse Practitioner

## 2020-10-07 ENCOUNTER — Other Ambulatory Visit: Payer: Self-pay | Admitting: Nurse Practitioner

## 2020-10-07 DIAGNOSIS — J4520 Mild intermittent asthma, uncomplicated: Secondary | ICD-10-CM

## 2020-10-07 MED ORDER — LEVALBUTEROL TARTRATE 45 MCG/ACT IN AERO: 1.0000 | INHALATION_SPRAY | Freq: Four times a day (QID) | RESPIRATORY_TRACT | 12 refills | Status: DC | PRN

## 2020-10-07 MED ORDER — LEVALBUTEROL TARTRATE 45 MCG/ACT IN AERO: 1.0000 | INHALATION_SPRAY | Freq: Four times a day (QID) | RESPIRATORY_TRACT | 3 refills | Status: DC | PRN

## 2020-10-07 NOTE — Addendum Note (Signed)
Addended by: Logan Bores on: 10/07/2020 02:26 PM   Modules accepted: Orders

## 2020-10-08 NOTE — Progress Notes (Signed)
Patient called and stated that Xopenex is not covered by [facility] and patient is requesting the Albuterol Inhaler instead. Stated that she has used this in the past and would like a Rx sent to pharmacy.   Wants it called into [facility].

## 2020-10-08 NOTE — Patient Instructions (Signed)
er

## 2020-10-09 ENCOUNTER — Telehealth: Payer: Medicare HMO

## 2020-10-09 MED ORDER — ALBUTEROL SULFATE HFA 108 (90 BASE) MCG/ACT IN AERS: 2.0000 | INHALATION_SPRAY | Freq: Four times a day (QID) | RESPIRATORY_TRACT | 2 refills | Status: DC | PRN

## 2020-10-09 NOTE — Addendum Note (Signed)
Addended by: Lauree Chandler on: 10/09/2020 10:23 AM   Modules accepted: Orders

## 2020-10-09 NOTE — Telephone Encounter (Signed)
Incoming fax received from [facility] indicating Levalbuterol Tartrate  Has been rejected and requires a prior authorization unless alternative selected.  Alternatives include albuterol sulfate HFA or Ventolin HFA  I called patient on her home phone to see what her preference is, call could not be completed at this time. I tried to reach patient on her mobile number and had to leave a message. Awaiting return call

## 2020-10-16 DIAGNOSIS — Z170 Estrogen receptor positive status [ER+]: Secondary | ICD-10-CM | POA: Diagnosis not present

## 2020-10-16 DIAGNOSIS — C50411 Malignant neoplasm of upper-outer quadrant of right female breast: Secondary | ICD-10-CM | POA: Diagnosis not present

## 2020-11-05 ENCOUNTER — Other Ambulatory Visit: Payer: Self-pay

## 2020-11-05 ENCOUNTER — Encounter: Payer: Self-pay | Admitting: Nurse Practitioner

## 2020-11-05 ENCOUNTER — Ambulatory Visit (INDEPENDENT_AMBULATORY_CARE_PROVIDER_SITE_OTHER): Payer: Medicare HMO | Admitting: Nurse Practitioner

## 2020-11-05 ENCOUNTER — Telehealth: Payer: Self-pay

## 2020-11-05 DIAGNOSIS — Z0000 Encounter for general adult medical examination without abnormal findings: Secondary | ICD-10-CM | POA: Diagnosis not present

## 2020-11-05 NOTE — Progress Notes (Signed)
This service is provided via telemedicine  No vital signs collected/recorded due to the encounter was a telemedicine visit.   Location of patient (ex: home, work): Home  Patient consents to a telephone visit:  Yes, see encounter dated 11/05/2020  Location of the provider (ex: office, home): [facility]  Name of any referring provider: N/A  Names of all persons participating in the telemedicine service and their role in the encounter:  Sherrie Mustache, Nurse Practitioner, Carroll Kinds, CMA, and patient.   Time spent on call: 10 minutes with medical assistant

## 2020-11-05 NOTE — Progress Notes (Signed)
Subjective:   Victoria Hudson is a 75 y.o. female who presents for Medicare Annual (Subsequent) preventive examination.  Review of Systems     Cardiac Risk Factors include: advanced age (>61men, >76 women);obesity (BMI >30kg/m2);hypertension;sedentary lifestyle     Objective:    There were no vitals filed for this visit. There is no height or weight on file to calculate BMI.  Advanced Directives 11/05/2020 08/17/2020 07/02/2020 03/02/2020 11/15/2019 11/07/2019 11/05/2019  Does Patient Have a Medical Advance Directive? Yes Yes Yes Yes Yes Yes Yes  Type of [place] administrator Power of [place] of [place];Living will [place]  Does patient want to make changes to medical advance directive? No - Patient declined No - Patient declined No - Patient declined No - Guardian declined - No - Patient declined No - Patient declined  Copy of [place] in Chart? Yes - validated most recent copy scanned in chart (See row information) Yes - validated most recent copy scanned in chart (See row information) Yes - validated most recent copy scanned in chart (See row information) - No - copy requested No - copy requested Yes - validated most recent copy scanned in chart (See row information)    Current Medications (verified) Outpatient Encounter Medications as of 11/05/2020  Medication Sig  . albuterol (VENTOLIN HFA) 108 (90 Base) MCG/ACT inhaler Inhale 2 puffs into the lungs every 6 (six) hours as needed for wheezing or shortness of breath.  Marland Kitchen amLODipine (NORVASC) 5 MG tablet TAKE 1 TABLET EVERY DAY  . anastrozole (ARIMIDEX) 1 MG tablet Take 1 mg by mouth daily.  . Calcium Carb-Cholecalciferol (CALCIUM/VITAMIN D) 600-400 MG-UNIT TABS Take 1 tablet by mouth in the morning and at bedtime.  . cyanocobalamin 2000 MCG tablet Take 2,000 mcg by mouth daily.  .  cyclobenzaprine (FLEXERIL) 10 MG tablet TAKE 1/2 TO 1 TABLET UP TO TWICE DAILY AS NEEDED  . esomeprazole (NEXIUM) 40 MG capsule Take 40 mg by mouth daily at 12 noon.  . estazolam (PROSOM) 2 MG tablet Take 1 tablet by mouth as needed at bedtime for muscle pain and insomnia  . Glucosamine 500 MG CAPS Take by mouth.  . levothyroxine (SYNTHROID) 50 MCG tablet TAKE 1 TABLET EVERY MORNING  . losartan (COZAAR) 100 MG tablet TAKE 1 TABLET EVERY DAY  . metoprolol succinate (TOPROL-XL) 100 MG 24 hr tablet Take 1 tablet (100 mg total) by mouth daily. Take with or immediately following a meal.  . mometasone (NASONEX) 50 MCG/ACT nasal spray Place 2 sprays into the nose daily as needed (allergies).  . ondansetron (ZOFRAN) 8 MG tablet Take by mouth every 8 (eight) hours as needed for nausea or vomiting.  . [DISCONTINUED] OMEPRAZOLE MAGNESIUM PO Take 1 capsule by mouth daily. 30 minutes before meal   No facility-administered encounter medications on file as of 11/05/2020.    Allergies (verified) Beta adrenergic blockers, Mobic [meloxicam], Sulfa antibiotics, Tape, Symbicort [budesonide-formoterol fumarate], Aspirin, and Oxycodone   History: Past Medical History:  Diagnosis Date  . Arthritis   . Asthma    " allergies"  . Chronic back pain   . Complication of anesthesia 1968   "fought the anesthesia" problems with memory 05/06/15 at [facility]"  . Depression   . Fibromyalgia   . GERD (gastroesophageal reflux disease)   . History of hiatal hernia   . Hypertension   . Hypothyroidism   .  PONV (postoperative nausea and vomiting)   . Thyroid disease   . Torn meniscus    left knee  . Wears glasses    Past Surgical History:  Procedure Laterality Date  . ABDOMINAL HYSTERECTOMY  1976  . ANTERIOR CERVICAL DECOMP/DISCECTOMY FUSION N/A 05/06/2015   Procedure: Cervical four-five, Cervical five-six, Cervical six-seven Anterior cervical decompression/diskectomy/fusion;  Surgeon: Leeroy Cha, MD;  Location:  [place] [facility];  Service: Neurosurgery;  Laterality: N/A;  C4-5 C5-6 C6-7 Anterior cervical decompression/diskectomy/fusion  . APPENDECTOMY    . BACK SURGERY    . BREAST LUMPECTOMY WITH RADIOACTIVE SEED LOCALIZATION Right 11/15/2019   Procedure: RIGHT BREAST LUMPECTOMY WITH RADIOACTIVE SEED LOCALIZATION;  Surgeon: Jovita Kussmaul, MD;  Location: [place];  Service: General;  Laterality: Right;  . BREAST SURGERY Bilateral    biopsy- 2 different times  . CARPAL TUNNEL RELEASE     B/L  . CARPAL TUNNEL RELEASE Left 04/15/2020  . CHOLECYSTECTOMY    . COLONOSCOPY  2014  . COLONOSCOPY W/ POLYPECTOMY    . dental implants    . DILATION AND CURETTAGE OF UTERUS  1968  . ELBOW ARTHROSCOPY Right    right  . FOOT GANGLION EXCISION Left    left  . KNEE ARTHROSCOPY Right    torn ligament  . LAMINECTOMY     L 4 and 5; 2 separate surgeries  . left hand     carpal tunel  . right hand     3 surgeries- carpal tunel   Family History  Problem Relation Age of Onset  . Hypertension Mother   . Lupus Mother   . Heart attack Mother   . Cancer Mother   . Stroke Mother   . Hypertension Father   . Heart disease Father   . Lupus Father   . Heart attack Father   . Heart attack Brother   . Leukemia Brother   . HIV/AIDS Sister   . Leukemia Brother   . Cancer Sister        intestinal  . Lupus Sister   . Breast cancer Sister   . Diabetes Daughter   . Diabetes Daughter   . Fibromyalgia Daughter   . Colon cancer Neg Hx    Social History   Socioeconomic History  . Marital status: Married    Spouse name: Not on file  . Number of children: Not on file  . Years of education: Not on file  . Highest education level: Not on file  Occupational History  . Not on file  Tobacco Use  . Smoking status: Never Smoker  . Smokeless tobacco: Never Used  Vaping Use  . Vaping Use: Never used  Substance and Sexual Activity  . Alcohol use: No  . Drug use: No  . Sexual activity: Not on file   Other Topics Concern  . Not on file  Social History Narrative   Social History      Diet? none      Do you drink/eat things with caffeine? No       Marital status?    married                                What year were you married? 1961      Do you live in a house, apartment, assisted living, condo, trailer, etc.? home      Is it one or more stories? 1 level  How many persons live in your home? 2       Do you have any pets in your home? (please list) none      Highest level of education completed? Completed college 4 years      Current or past profession: social work activist      Do you exercise?        yes                              Type & how often? Aerobic 4 to 5 times a weeks      Advanced Directives      Do you have a living will? yes      Do you have a DNR form?                                  If not, do you want to discuss one?no       Do you have signed POA/HPOA for forms? yes      Functional Status      Do you have difficulty bathing or dressing yourself? no      Do you have difficulty preparing food or eating? No       Do you have difficulty managing your medications? No       Do you have difficulty managing your finances? No       Do you have difficulty affording your medications? No       Social Determinants of Radio broadcast assistant Strain: Not on file  Food Insecurity: Not on file  Transportation Needs: Not on file  Physical Activity: Not on file  Stress: Not on file  Social Connections: Not on file    Tobacco Counseling Counseling given: Not Answered   Clinical Intake:  Pre-visit preparation completed: Yes  Pain : No/denies pain     BMI - recorded: 33 Nutritional Risks: Nausea/ vomitting/ diarrhea (nausea) Diabetes: No  How often do you need to have someone help you when you read instructions, pamphlets, or other written materials from your doctor or pharmacy?: 1 - Never  Diabetic?no         Activities  of Daily Living In your present state of health, do you have any difficulty performing the following activities: 11/05/2020  Hearing? Y  Vision? N  Difficulty concentrating or making decisions? N  Walking or climbing stairs? N  Dressing or bathing? N  Doing errands, shopping? N  Preparing Food and eating ? N  Using the Toilet? N  In the past six months, have you accidently leaked urine? Y  Do you have problems with loss of bowel control? N  Managing your Medications? N  Managing your Finances? N  Housekeeping or managing your Housekeeping? N  Some recent data might be hidden    Patient Care Team: Lauree Chandler, NP as PCP - General (Geriatric Medicine) Mammography, Teola Bradley (Diagnostic Radiology) Jovita Gamma, MD as Consulting Physician (Neurosurgery) Wilford Corner, MD as Consulting Physician (Gastroenterology) Allyn Kenner, MD (Dermatology) Thornell Sartorius, MD (Otolaryngology)  Indicate any recent Medical Services you may have received from other than [facility] providers in the past year (date may be approximate).     Assessment:   This is a routine wellness examination for [place].  Hearing/Vision screen  Hearing Screening   125Hz  250Hz  500Hz  1000Hz  2000Hz  3000Hz  4000Hz  6000Hz  8000Hz   Right ear:           Left ear:           Comments: Patient has trouble with hearing left side.  Vision Screening Comments: Patient wears glasses. Patient had last eye exam Feb. Patient sees Dr. Marica Otter  Dietary issues and exercise activities discussed: Current Exercise Habits: Home exercise routine, Type of exercise: calisthenics, Time (Minutes): 30, Frequency (Times/Week): 4, Weekly Exercise (Minutes/Week): 120  Goals Addressed   None    Depression Screen PHQ 2/9 Scores 11/05/2020 03/02/2020 10/31/2019 08/26/2019 08/20/2019 08/05/2019 07/05/2019  PHQ - 2 Score 0 0 0 0 0 0 0    Fall Risk Fall Risk  11/05/2020 07/02/2020 03/02/2020 11/05/2019 10/31/2019  Falls in the past year? 0 0 1 0 0   Number falls in past yr: 0 0 0 0 0  Injury with Fall? 0 0 1 0 0  Comment - - hurt hip/ tail bone - -    FALL RISK PREVENTION PERTAINING TO THE HOME:  Any stairs in or around the home? Yes  If so, are there any without handrails? No  Home free of loose throw rugs in walkways, pet beds, electrical cords, etc? Yes  Adequate lighting in your home to reduce risk of falls? Yes   ASSISTIVE DEVICES UTILIZED TO PREVENT FALLS:  Life alert? No  Use of a cane, walker or w/c? No  Grab bars in the bathroom? Yes  Shower chair or bench in shower? Yes  Elevated toilet seat or a handicapped toilet? No   TIMED UP AND GO: na  Cognitive Function:     6CIT Screen 11/05/2020  What Year? 0 points  What month? 0 points  What time? 0 points  Count back from 20 0 points  Months in reverse 2 points  Repeat phrase 0 points  Total Score 2    Immunizations Immunization History  Administered Date(s) Administered  . Fluad Quad(high Dose 65+) 03/02/2020  . Hepatitis B 06/06/2016  . Influenza Split 03/06/2017, 02/07/2018, 01/30/2019  . Influenza-Unspecified 01/30/2019  . PFIZER(Purple Top)SARS-COV-2 Vaccination 09/13/2019, 10/07/2019, 04/09/2020  . Pneumococcal Conjugate-13 10/01/2014  . Pneumococcal Polysaccharide-23 11/24/2009  . Pneumococcal-Unspecified 06/06/2016  . Tdap 12/11/2010  . Zoster, Live 06/07/2011    TDAP status: Up to date  Flu Vaccine status: Up to date  Pneumococcal vaccine status: Up to date  Covid-19 vaccine status: Completed vaccines  Qualifies for Shingles Vaccine? Yes   Zostavax completed Yes   Shingrix Completed?: No.    Education has been provided regarding the importance of this vaccine. Patient has been advised to call insurance company to determine out of pocket expense if they have not yet received this vaccine. Advised may also receive vaccine at local pharmacy or Health Dept. Verbalized acceptance and understanding.  Screening Tests Health Maintenance   Topic Date Due  . Zoster Vaccines- Shingrix (1 of 2) Never done  . DEXA SCAN  Never done  . COVID-19 Vaccine (4 - Booster for Pfizer series) 07/10/2020  . TETANUS/TDAP  12/10/2020  . INFLUENZA VACCINE  01/04/2021  . COLONOSCOPY (Pts 45-68yrs Insurance coverage will need to be confirmed)  06/06/2024  . Hepatitis C Screening  Completed  . PNA vac Low Risk Adult  Completed  . HPV VACCINES  Aged Out    Health Maintenance  Health Maintenance Due  Topic Date Due  . Zoster Vaccines- Shingrix (1 of 2) Never done  . DEXA SCAN  Never done  . COVID-19 Vaccine (4 - Booster  for [place] series) 07/10/2020    Colorectal cancer screening: Type of screening: Colonoscopy. Completed 2019. Repeat every 5 years  Mammogram status: Completed april 2022. Repeat every year  Bone Density status: Completed april 22. Results reflect: Bone density results: OSTEOPENIA. Repeat every 2 years.  Lung Cancer Screening: (Low Dose CT Chest recommended if Age 66-80 years, 30 pack-year currently smoking OR have quit w/in 15years.) does not qualify.   Lung Cancer Screening Referral: na  Additional Screening:  Hepatitis C Screening: does qualify; Completed   Vision Screening: Recommended annual ophthalmology exams for early detection of glaucoma and other disorders of the eye. Is the patient up to date with their annual eye exam?  Yes  Who is the provider or what is the name of the office in which the patient attends annual eye exams? Dr Sabra Heck If pt is not established with a provider, would they like to be referred to a provider to establish care? No .   Dental Screening: Recommended annual dental exams for proper oral hygiene  Community Resource Referral / Chronic Care Management: CRR required this visit?  No   CCM required this visit?  No      Plan:     I have personally reviewed and noted the following in the patient's chart:   . Medical and social history . Use of alcohol, tobacco or illicit  drugs  . Current medications and supplements including opioid prescriptions.  . Functional ability and status . Nutritional status . Physical activity . Advanced directives . List of other physicians . Hospitalizations, surgeries, and ER visits in previous 12 months . Vitals . Screenings to include cognitive, depression, and falls . Referrals and appointments  In addition, I have reviewed and discussed with patient certain preventive protocols, quality metrics, and best practice recommendations. A written personalized care plan for preventive services as well as general preventive health recommendations were provided to patient.     Lauree Chandler, NP   11/05/2020    Virtual Visit via Telephone Note  I connected with@ on 11/05/20 at 11:00 AM EDT by telephone and verified that I am speaking with the correct person using two identifiers.  Location: Patient: home Provider: [facility]   I discussed the limitations, risks, security and privacy concerns of performing an evaluation and management service by telephone and the availability of in person appointments. I also discussed with the patient that there may be a patient responsible charge related to this service. The patient expressed understanding and agreed to proceed.   I discussed the assessment and treatment plan with the patient. The patient was provided an opportunity to ask questions and all were answered. The patient agreed with the plan and demonstrated an understanding of the instructions.   The patient was advised to call back or seek an in-person evaluation if the symptoms worsen or if the condition fails to improve as anticipated.  I provided 15 minutes of non-face-to-face time during this encounter.  Carlos American. Harle Battiest Avs printed and mailed

## 2020-11-05 NOTE — Patient Instructions (Signed)
Victoria Hudson , Thank you for taking time to come for your Medicare Wellness Visit. I appreciate your ongoing commitment to your health goals. Please review the following plan we discussed and let me know if I can assist you in the future.   Screening recommendations/referrals: Colonoscopy up to date Mammogram up to date  Bone Density up to date Recommended yearly ophthalmology/optometry visit for glaucoma screening and checkup Recommended yearly dental visit for hygiene and checkup  Vaccinations: Influenza vaccine up to date Pneumococcal vaccine up to date Tdap vaccine up to date Shingles vaccine  RECOMMENDED to get shingrix at your local pharmacy.     Advanced directives: on file.   Conditions/risks identified: advanced age, obesity.   Next appointment: 1 year.    Preventive Care 13 Years and Older, Female Preventive care refers to lifestyle choices and visits with your health care provider that can promote health and wellness. What does preventive care include?  A yearly physical exam. This is also called an annual well check.  Dental exams once or twice a year.  Routine eye exams. Ask your health care provider how often you should have your eyes checked.  Personal lifestyle choices, including:  Daily care of your teeth and gums.  Regular physical activity.  Eating a healthy diet.  Avoiding tobacco and drug use.  Limiting alcohol use.  Practicing safe sex.  Taking low-dose aspirin every day.  Taking vitamin and mineral supplements as recommended by your health care provider. What happens during an annual well check? The services and screenings done by your health care provider during your annual well check will depend on your age, overall health, lifestyle risk factors, and family history of disease. Counseling  Your health care provider may ask you questions about your:  Alcohol use.  Tobacco use.  Drug use.  Emotional well-being.  Home and  relationship well-being.  Sexual activity.  Eating habits.  History of falls.  Memory and ability to understand (cognition).  Work and work Statistician.  Reproductive health. Screening  You may have the following tests or measurements:  Height, weight, and BMI.  Blood pressure.  Lipid and cholesterol levels. These may be checked every 5 years, or more frequently if you are over 57 years old.  Skin check.  Lung cancer screening. You may have this screening every year starting at age 2 if you have a 30-pack-year history of smoking and currently smoke or have quit within the past 15 years.  Fecal occult blood test (FOBT) of the stool. You may have this test every year starting at age 40.  Flexible sigmoidoscopy or colonoscopy. You may have a sigmoidoscopy every 5 years or a colonoscopy every 10 years starting at age 49.  Hepatitis C blood test.  Hepatitis B blood test.  Sexually transmitted disease (STD) testing.  Diabetes screening. This is done by checking your blood sugar (glucose) after you have not eaten for a while (fasting). You may have this done every 1-3 years.  Bone density scan. This is done to screen for osteoporosis. You may have this done starting at age 43.  Mammogram. This may be done every 1-2 years. Talk to your health care provider about how often you should have regular mammograms. Talk with your health care provider about your test results, treatment options, and if necessary, the need for more tests. Vaccines  Your health care provider may recommend certain vaccines, such as:  Influenza vaccine. This is recommended every year.  Tetanus, diphtheria, and acellular pertussis (  Tdap, Td) vaccine. You may need a Td booster every 10 years.  Zoster vaccine. You may need this after age 61.  Pneumococcal 13-valent conjugate (PCV13) vaccine. One dose is recommended after age 64.  Pneumococcal polysaccharide (PPSV23) vaccine. One dose is recommended after  age 80. Talk to your health care provider about which screenings and vaccines you need and how often you need them. This information is not intended to replace advice given to you by your health care provider. Make sure you discuss any questions you have with your health care provider. Document Released: 06/19/2015 Document Revised: 02/10/2016 Document Reviewed: 03/24/2015 Elsevier Interactive Patient Education  2017 [place] Prevention in the Home Falls can cause injuries. They can happen to people of all ages. There are many things you can do to make your home safe and to help prevent falls. What can I do on the outside of my home?  Regularly fix the edges of walkways and driveways and fix any cracks.  Remove anything that might make you trip as you walk through a door, such as a raised step or threshold.  Trim any bushes or trees on the path to your home.  Use bright outdoor lighting.  Clear any walking paths of anything that might make someone trip, such as rocks or tools.  Regularly check to see if handrails are loose or broken. Make sure that both sides of any steps have handrails.  Any raised decks and porches should have guardrails on the edges.  Have any leaves, snow, or ice cleared regularly.  Use sand or salt on walking paths during winter.  Clean up any spills in your garage right away. This includes oil or grease spills. What can I do in the bathroom?  Use night lights.  Install grab bars by the toilet and in the tub and shower. Do not use towel bars as grab bars.  Use non-skid mats or decals in the tub or shower.  If you need to sit down in the shower, use a plastic, non-slip stool.  Keep the floor dry. Clean up any water that spills on the floor as soon as it happens.  Remove soap buildup in the tub or shower regularly.  Attach bath mats securely with double-sided non-slip rug tape.  Do not have throw rugs and other things on the floor that can  make you trip. What can I do in the bedroom?  Use night lights.  Make sure that you have a light by your bed that is easy to reach.  Do not use any sheets or blankets that are too big for your bed. They should not hang down onto the floor.  Have a firm chair that has side arms. You can use this for support while you get dressed.  Do not have throw rugs and other things on the floor that can make you trip. What can I do in the kitchen?  Clean up any spills right away.  Avoid walking on wet floors.  Keep items that you use a lot in easy-to-reach places.  If you need to reach something above you, use a strong step stool that has a grab bar.  Keep electrical cords out of the way.  Do not use floor polish or wax that makes floors slippery. If you must use wax, use non-skid floor wax.  Do not have throw rugs and other things on the floor that can make you trip. What can I do with my stairs?  Do  not leave any items on the stairs.  Make sure that there are handrails on both sides of the stairs and use them. Fix handrails that are broken or loose. Make sure that handrails are as long as the stairways.  Check any carpeting to make sure that it is firmly attached to the stairs. Fix any carpet that is loose or worn.  Avoid having throw rugs at the top or bottom of the stairs. If you do have throw rugs, attach them to the floor with carpet tape.  Make sure that you have a light switch at the top of the stairs and the bottom of the stairs. If you do not have them, ask someone to add them for you. What else can I do to help prevent falls?  Wear shoes that:  Do not have high heels.  Have rubber bottoms.  Are comfortable and fit you well.  Are closed at the toe. Do not wear sandals.  If you use a stepladder:  Make sure that it is fully opened. Do not climb a closed stepladder.  Make sure that both sides of the stepladder are locked into place.  Ask someone to hold it for you,  if possible.  Clearly mark and make sure that you can see:  Any grab bars or handrails.  First and last steps.  Where the edge of each step is.  Use tools that help you move around (mobility aids) if they are needed. These include:  Canes.  Walkers.  Scooters.  Crutches.  Turn on the lights when you go into a dark area. Replace any light bulbs as soon as they burn out.  Set up your furniture so you have a clear path. Avoid moving your furniture around.  If any of your floors are uneven, fix them.  If there are any pets around you, be aware of where they are.  Review your medicines with your doctor. Some medicines can make you feel dizzy. This can increase your chance of falling. Ask your doctor what other things that you can do to help prevent falls. This information is not intended to replace advice given to you by your health care provider. Make sure you discuss any questions you have with your health care provider. Document Released: 03/19/2009 Document Revised: 10/29/2015 Document Reviewed: 06/27/2014 Elsevier Interactive Patient Education  2017 [employer].

## 2020-11-05 NOTE — Telephone Encounter (Signed)
Ms. takako, minckler are scheduled for a virtual visit with your provider today.    Just as we do with appointments in the office, we must obtain your consent to participate.  Your consent will be active for this visit and any virtual visit you may have with one of our providers in the next 365 days.    If you have a MyChart account, I can also send a copy of this consent to you electronically.  All virtual visits are billed to your insurance company just like a traditional visit in the office.  As this is a virtual visit, video technology does not allow for your provider to perform a traditional examination.  This may limit your provider's ability to fully assess your condition.  If your provider identifies any concerns that need to be evaluated in person or the need to arrange testing such as labs, EKG, etc, we will make arrangements to do so.    Although advances in technology are sophisticated, we cannot ensure that it will always work on either your end or our end.  If the connection with a video visit is poor, we may have to switch to a telephone visit.  With either a video or telephone visit, we are not always able to ensure that we have a secure connection.   I need to obtain your verbal consent now.   Are you willing to proceed with your visit today?   Charyl L Ecklund has provided verbal consent on 11/05/2020 for a virtual visit (video or telephone).   Carroll Kinds, CMA 11/05/2020  11:20 AM

## 2020-11-13 ENCOUNTER — Ambulatory Visit
Admission: RE | Admit: 2020-11-13 | Discharge: 2020-11-13 | Disposition: A | Discharge status: Home / Self Care | Payer: Medicare HMO | Source: Ambulatory Visit | Attending: Adult Health | Admitting: Adult Health

## 2020-11-13 ENCOUNTER — Ambulatory Visit (INDEPENDENT_AMBULATORY_CARE_PROVIDER_SITE_OTHER): Payer: Medicare HMO | Admitting: Adult Health

## 2020-11-13 ENCOUNTER — Encounter: Payer: Self-pay | Admitting: Adult Health

## 2020-11-13 ENCOUNTER — Other Ambulatory Visit: Payer: Self-pay

## 2020-11-13 VITALS — BP 118/60 | HR 70 | Temp 97.5°F | Resp 16 | Ht 61.0 in | Wt 182.2 lb

## 2020-11-13 DIAGNOSIS — J029 Acute pharyngitis, unspecified: Secondary | ICD-10-CM

## 2020-11-13 DIAGNOSIS — R059 Cough, unspecified: Secondary | ICD-10-CM | POA: Diagnosis not present

## 2020-11-13 NOTE — Progress Notes (Signed)
[facility] clinic  Provider:   Durenda Age  DNP  Code Status:   Full Code  Goals of Care:  Advanced Directives 11/13/2020  Does Patient Have a Medical Advance Directive? Yes  Type of Advance Directive [place]  Does patient want to make changes to medical advance directive? No - Patient declined  Copy of [place] in Chart? Yes - validated most recent copy scanned in chart (See row information)     Chief Complaint  Patient presents with   Acute Visit    Complains of chronic cough, negative covid test, current cough treatment not working.    HPI: Patient is a 75 y.o. female seen today for an acute visit for coughing. She coughed all day yesterday and stopped when she went to sleep. She went out in the yard 2 weeks ago (Sunday) and started coughing more. Dry cough. She is using an inhaler -  Beclomethasone diproprionate 42 mcg. Discussed that it is no longer on the list of her medications. She uses Albuterol inhaler PRN. She was tested for COVID-19  which was negative. She complained of sore throat. Noted bilateral throat to be erythematous. She denies having fever nor chills.    Past Medical History:  Diagnosis Date   Arthritis    Asthma    " allergies"   Chronic back pain    Complication of anesthesia 1968   "fought the anesthesia" problems with memory 05/06/15 at [facility]"   Depression    Fibromyalgia    GERD (gastroesophageal reflux disease)    History of hiatal hernia    Hypertension    Hypothyroidism    PONV (postoperative nausea and vomiting)    Thyroid disease    Torn meniscus    left knee   Wears glasses     Past Surgical History:  Procedure Laterality Date   ABDOMINAL HYSTERECTOMY  1976   ANTERIOR CERVICAL DECOMP/DISCECTOMY FUSION N/A 05/06/2015   Procedure: Cervical four-five, Cervical five-six, Cervical six-seven Anterior cervical decompression/diskectomy/fusion;  Surgeon: Leeroy Cha, MD;  [facility];  Service: Neurosurgery;  Laterality: N/A;  C4-5 C5-6 C6-7 Anterior cervical decompression/diskectomy/fusion   APPENDECTOMY     BACK SURGERY     BREAST LUMPECTOMY WITH RADIOACTIVE SEED LOCALIZATION Right 11/15/2019   Procedure: RIGHT BREAST LUMPECTOMY WITH RADIOACTIVE SEED LOCALIZATION;  Surgeon: [name], MD;  Location: [facility];  Service: General;  Laterality: Right;   BREAST SURGERY Bilateral    biopsy- 2 different times   CARPAL TUNNEL RELEASE     B/L   CARPAL TUNNEL RELEASE Left 04/15/2020   CHOLECYSTECTOMY     COLONOSCOPY  2014   COLONOSCOPY W/ POLYPECTOMY     dental implants     DILATION AND CURETTAGE OF UTERUS  1968   ELBOW ARTHROSCOPY Right    right   FOOT GANGLION EXCISION Left    left   KNEE ARTHROSCOPY Right    torn ligament   LAMINECTOMY     L 4 and 5; 2 separate surgeries   left hand     carpal tunel   right hand     3 surgeries- carpal tunel    Allergies  Allergen Reactions   Beta Adrenergic Blockers Other (See Comments)    Certain beta blockers make me hoarse.   Mobic [Meloxicam] Hypertension    Made blood pressure extremely high   Sulfa Antibiotics Itching and Swelling   Tape Rash    Paper tape  Symbicort [Budesonide-Formoterol Fumarate] Cough    Coughing up blood    Aspirin Other (See Comments)    Stomach burning; increased acid   Oxycodone Other (See Comments)    It makes her someone she's not     Outpatient Encounter Medications as of 11/13/2020  Medication Sig   albuterol (VENTOLIN HFA) 108 (90 Base) MCG/ACT inhaler Inhale 2 puffs into the lungs every 6 (six) hours as needed for wheezing or shortness of breath.   amLODipine (NORVASC) 5 MG tablet TAKE 1 TABLET EVERY DAY   anastrozole (ARIMIDEX) 1 MG tablet Take 1 mg by mouth daily.   Calcium Carb-Cholecalciferol (CALCIUM/VITAMIN D) 600-400 MG-UNIT TABS Take 1 tablet by mouth in the morning and at bedtime.   cyanocobalamin 2000 MCG tablet Take 2,000 mcg by mouth daily.    cyclobenzaprine (FLEXERIL) 10 MG tablet TAKE 1/2 TO 1 TABLET UP TO TWICE DAILY AS NEEDED   esomeprazole (NEXIUM) 40 MG capsule Take 40 mg by mouth daily at 12 noon.   estazolam (PROSOM) 2 MG tablet Take 1 tablet by mouth as needed at bedtime for muscle pain and insomnia   Glucosamine 500 MG CAPS Take by mouth.   levothyroxine (SYNTHROID) 50 MCG tablet TAKE 1 TABLET EVERY MORNING   losartan (COZAAR) 100 MG tablet TAKE 1 TABLET EVERY DAY   metoprolol succinate (TOPROL-XL) 100 MG 24 hr tablet Take 1 tablet (100 mg total) by mouth daily. Take with or immediately following a meal.   mometasone (NASONEX) 50 MCG/ACT nasal spray Place 2 sprays into the nose daily as needed (allergies).   ondansetron (ZOFRAN) 8 MG tablet Take by mouth every 8 (eight) hours as needed for nausea or vomiting.   No facility-administered encounter medications on file as of 11/13/2020.    Review of Systems:  Review of Systems  Constitutional:  Positive for activity change and fatigue. Negative for chills and fever.       Feels tired  HENT:  Positive for ear pain. Negative for congestion.        Left ear pain  Eyes:  Negative for discharge and itching.  Respiratory:  Positive for cough and wheezing. Negative for chest tightness.   Gastrointestinal:  Negative for abdominal pain, nausea and vomiting.  Genitourinary:  Negative for difficulty urinating.  Neurological:  Negative for dizziness.       Hoarse voice  Psychiatric/Behavioral: Negative.     Health Maintenance  Topic Date Due   Zoster Vaccines- Shingrix (1 of 2) Never done   DEXA SCAN  Never done   COVID-19 Vaccine (4 - Booster for Pfizer series) 07/10/2020   TETANUS/TDAP  12/10/2020   INFLUENZA VACCINE  01/04/2021   COLONOSCOPY (Pts 45-56yrs Insurance coverage will need to be confirmed)  06/06/2024   Hepatitis C Screening  Completed   PNA vac Low Risk Adult  Completed   HPV VACCINES  Aged Out    Physical Exam: Vitals:   11/13/20 1353  BP: 118/60   Pulse: 70  Resp: 16  Temp: (!) 97.5 F (36.4 C)  SpO2: 97%  Weight: 182 lb 3.2 oz (82.6 kg)  Height: 5\' 1"  (1.549 m)   Body mass index is 34.43 kg/m. Physical Exam Constitutional:      Comments: Obese  HENT:     Head: Normocephalic and atraumatic.  Eyes:     Conjunctiva/sclera: Conjunctivae normal.  Cardiovascular:     Rate and Rhythm: Normal rate and regular rhythm.  Pulmonary:     Effort: Pulmonary effort is normal.  Breath sounds: Rales present.  Abdominal:     General: Bowel sounds are normal.  Musculoskeletal:        General: Normal range of motion.     Cervical back: Normal range of motion.  Skin:    General: Skin is warm.  Neurological:     General: No focal deficit present.     Mental Status: She is alert.  Psychiatric:        Mood and Affect: Mood normal.        Thought Content: Thought content normal.        Judgment: Judgment normal.    Labs reviewed: Basic Metabolic Panel: Recent Labs    03/02/20 1403  NA 138  K 4.2  CL 106  CO2 23  GLUCOSE 95  BUN 14  CREATININE 0.97*  CALCIUM 9.8  TSH 1.78   Liver Function Tests: Recent Labs    03/02/20 1403  AST 23  ALT 20  BILITOT 0.5  PROT 6.5   No results for input(s): LIPASE, AMYLASE in the last 8760 hours. No results for input(s): AMMONIA in the last 8760 hours. CBC: Recent Labs    03/02/20 1403  WBC 3.9  NEUTROABS 2,254  HGB 13.0  HCT 40.5  MCV 86.5  PLT 277   Lipid Panel: Recent Labs    03/02/20 1403  CHOL 176  HDL 61  LDLCALC 92  TRIG 136  CHOLHDL 2.9   Lab Results  Component Value Date   HGBA1C 5.7 (H) 03/02/2020    Procedures since last visit: No results found.  Assessment/Plan  1. Coughing -  she is using PRN Delsym   -   will get chest x-ray done to rule out pneumonia - DG Chest 2 View  2. Sore throat -  discussed to gargle warm salty water TID, add 1/4 to 1/2 tsp of salt to 8 oz of warm water     Labs/tests ordered:   chest x-ray   Next appt:   12/16/2020

## 2020-11-13 NOTE — Patient Instructions (Signed)

## 2020-11-18 NOTE — Progress Notes (Signed)
Chest x-ray is negative for acute process.

## 2020-11-24 DIAGNOSIS — H6593 Unspecified nonsuppurative otitis media, bilateral: Secondary | ICD-10-CM | POA: Diagnosis not present

## 2020-12-10 DIAGNOSIS — B029 Zoster without complications: Secondary | ICD-10-CM | POA: Diagnosis not present

## 2020-12-10 DIAGNOSIS — Z79811 Long term (current) use of aromatase inhibitors: Secondary | ICD-10-CM | POA: Diagnosis not present

## 2020-12-10 DIAGNOSIS — Z170 Estrogen receptor positive status [ER+]: Secondary | ICD-10-CM | POA: Diagnosis not present

## 2020-12-10 DIAGNOSIS — C50411 Malignant neoplasm of upper-outer quadrant of right female breast: Secondary | ICD-10-CM | POA: Diagnosis not present

## 2020-12-12 ENCOUNTER — Encounter (HOSPITAL_COMMUNITY): Payer: Self-pay

## 2020-12-16 ENCOUNTER — Ambulatory Visit: Payer: Medicare HMO | Admitting: Nurse Practitioner

## 2020-12-16 ENCOUNTER — Other Ambulatory Visit: Payer: Self-pay | Admitting: *Deleted

## 2020-12-16 DIAGNOSIS — J4520 Mild intermittent asthma, uncomplicated: Secondary | ICD-10-CM

## 2020-12-16 MED ORDER — ALBUTEROL SULFATE HFA 108 (90 BASE) MCG/ACT IN AERS: 2.0000 | INHALATION_SPRAY | Freq: Four times a day (QID) | RESPIRATORY_TRACT | 2 refills | Status: DC | PRN

## 2020-12-16 NOTE — Telephone Encounter (Signed)
[facility] requested refill Pended and sent to [facility] for approval due to [place].

## 2020-12-21 DIAGNOSIS — Z6835 Body mass index (BMI) 35.0-35.9, adult: Secondary | ICD-10-CM | POA: Diagnosis not present

## 2020-12-21 DIAGNOSIS — Z8619 Personal history of other infectious and parasitic diseases: Secondary | ICD-10-CM | POA: Diagnosis not present

## 2020-12-22 ENCOUNTER — Other Ambulatory Visit: Payer: Self-pay | Admitting: *Deleted

## 2020-12-22 DIAGNOSIS — M797 Fibromyalgia: Secondary | ICD-10-CM

## 2020-12-22 MED ORDER — ESTAZOLAM 2 MG PO TABS: ORAL_TABLET | ORAL | 1 refills | Status: DC

## 2020-12-22 NOTE — Telephone Encounter (Signed)
Pharmacy requested refill  Epic LR: 06/19/2020 Pended Rx and sent to [facility] for approval Janett Billow out of office)

## 2021-01-01 ENCOUNTER — Encounter: Payer: Self-pay | Admitting: Nurse Practitioner

## 2021-01-01 ENCOUNTER — Ambulatory Visit (INDEPENDENT_AMBULATORY_CARE_PROVIDER_SITE_OTHER): Payer: Medicare HMO | Admitting: Nurse Practitioner

## 2021-01-01 ENCOUNTER — Other Ambulatory Visit: Payer: Self-pay

## 2021-01-01 VITALS — BP 126/80 | HR 72 | Temp 97.5°F | Ht 61.0 in | Wt 182.0 lb

## 2021-01-01 DIAGNOSIS — I10 Essential (primary) hypertension: Secondary | ICD-10-CM

## 2021-01-01 DIAGNOSIS — B0229 Other postherpetic nervous system involvement: Secondary | ICD-10-CM | POA: Diagnosis not present

## 2021-01-01 MED ORDER — LIDOCAINE 5 % EX PTCH: 2.0000 | MEDICATED_PATCH | CUTANEOUS | 0 refills | Status: DC

## 2021-01-01 NOTE — Patient Instructions (Signed)
To use lidocaine patch to painful areas- on 12 hours and then off 12 hours  Continue tramadol with tylenol as needed for pain

## 2021-01-01 NOTE — Progress Notes (Signed)
Careteam: Patient Care Team: Lauree Chandler, NP as PCP - General (Geriatric Medicine) Mammography, Teola Bradley (Diagnostic Radiology) Jovita Gamma, MD as Consulting Physician (Neurosurgery) Wilford Corner, MD as Consulting Physician (Gastroenterology) Allyn Kenner, MD (Dermatology) Thornell Sartorius, MD (Otolaryngology)  PLACE OF SERVICE:  [facility]  Advanced Directive information    Allergies  Allergen Reactions   Beta Adrenergic Blockers Other (See Comments)    Certain beta blockers make me hoarse.   Mobic [Meloxicam] Hypertension    Made blood pressure extremely high   Sulfa Antibiotics Itching and Swelling   Tape Rash    Paper tape   Symbicort [Budesonide-Formoterol Fumarate] Cough    Coughing up blood    Aspirin Other (See Comments)    Stomach burning; increased acid   Oxycodone Other (See Comments)    It makes her someone she's not     Chief Complaint  Patient presents with   Acute Visit    Pain related to shingles. Patient was taking gabapentin 300 mg tid and valtrex x 14 days, prescribed by cancer doctor with [facility]. Patient still with pain.      HPI: Patient is a 75 y.o. female due to pain with shingles. Diagnosised with shingles on 12/10/20. Lesions have healed but  Reports pain and lesions to her right side breast and back. Still having significant pain due to this. She tried tramadol which she had on hand and then her back specialist gave her Rx. Makes her very fatigue so she does not take a lot. Also using asper cream which helps.   She can not take tramadol when she has to drive or function at a high level.  Tylenol or advil not helping.  Gabapentin did not help  Rx for tramadol 50 mg - she takes 1 tablet but tries not to take frequently.   Would like to reduce her blood pressure medication, metoprolol makes her very sleepy/drag during the day.   Review of Systems:  Review of Systems  Constitutional:  Positive for malaise/fatigue. Negative  for chills and fever.  Skin:        Painful area where shingles lesions were.   Neurological:  Positive for sensory change.   Past Medical History:  Diagnosis Date   Arthritis    Asthma    " allergies"   Chronic back pain    Complication of anesthesia 1968   "fought the anesthesia" problems with memory 05/06/15 at [facility]"   Depression    Fibromyalgia    GERD (gastroesophageal reflux disease)    History of hiatal hernia    Hypertension    Hypothyroidism    PONV (postoperative nausea and vomiting)    Thyroid disease    Torn meniscus    left knee   Wears glasses    Past Surgical History:  Procedure Laterality Date   ABDOMINAL HYSTERECTOMY  1976   ANTERIOR CERVICAL DECOMP/DISCECTOMY FUSION N/A 05/06/2015   Procedure: Cervical four-five, Cervical five-six, Cervical six-seven Anterior cervical decompression/diskectomy/fusion;  Surgeon: Leeroy Cha, MD;  Location: [facility];  Service: Neurosurgery;  Laterality: N/A;  C4-5 C5-6 C6-7 Anterior cervical decompression/diskectomy/fusion   APPENDECTOMY     BACK SURGERY     BREAST LUMPECTOMY WITH RADIOACTIVE SEED LOCALIZATION Right 11/15/2019   Procedure: RIGHT BREAST LUMPECTOMY WITH RADIOACTIVE SEED LOCALIZATION;  Surgeon: Jovita Kussmaul, MD;  Location: [facility];  Service: General;  Laterality: Right;   BREAST SURGERY Bilateral    biopsy- 2 different times  CARPAL TUNNEL RELEASE     B/L   CARPAL TUNNEL RELEASE Left 04/15/2020   CHOLECYSTECTOMY     COLONOSCOPY  2014   COLONOSCOPY W/ POLYPECTOMY     dental implants     DILATION AND CURETTAGE OF UTERUS  1968   ELBOW ARTHROSCOPY Right    right   FOOT GANGLION EXCISION Left    left   KNEE ARTHROSCOPY Right    torn ligament   LAMINECTOMY     L 4 and 5; 2 separate surgeries   left hand     carpal tunel   right hand     3 surgeries- carpal tunel   Social History:   reports that she has never smoked. She has never used smokeless tobacco. She reports that  she does not drink alcohol and does not use drugs.  Family History  Problem Relation Age of Onset   Hypertension Mother    Lupus Mother    Heart attack Mother    Cancer Mother    Stroke Mother    Hypertension Father    Heart disease Father    Lupus Father    Heart attack Father    Heart attack Brother    Leukemia Brother    HIV/AIDS Sister    Leukemia Brother    Cancer Sister        intestinal   Lupus Sister    Breast cancer Sister    Diabetes Daughter    Diabetes Daughter    Fibromyalgia Daughter    Colon cancer Neg Hx     Medications: Patient's Medications  New Prescriptions   No medications on file  Previous Medications   ALBUTEROL (VENTOLIN HFA) 108 (90 BASE) MCG/ACT INHALER    Inhale 2 puffs into the lungs every 6 (six) hours as needed for wheezing or shortness of breath.   AMLODIPINE (NORVASC) 5 MG TABLET    TAKE 1 TABLET EVERY DAY   ANASTROZOLE (ARIMIDEX) 1 MG TABLET    Take 1 mg by mouth daily.   CALCIUM CARB-CHOLECALCIFEROL (CALCIUM/VITAMIN D) 600-400 MG-UNIT TABS    Take 1 tablet by mouth in the morning and at bedtime.   CYANOCOBALAMIN 2000 MCG TABLET    Take 2,000 mcg by mouth daily.   CYCLOBENZAPRINE (FLEXERIL) 10 MG TABLET    TAKE 1/2 TO 1 TABLET UP TO TWICE DAILY AS NEEDED   ESOMEPRAZOLE (NEXIUM) 40 MG CAPSULE    Take 40 mg by mouth daily at 12 noon.   ESTAZOLAM (PROSOM) 2 MG TABLET    Take 1 tablet by mouth as needed at bedtime for muscle pain and insomnia   GLUCOSAMINE 500 MG CAPS    Take by mouth 2 (two) times daily.   LEVOTHYROXINE (SYNTHROID) 50 MCG TABLET    TAKE 1 TABLET EVERY MORNING   LOSARTAN (COZAAR) 100 MG TABLET    TAKE 1 TABLET EVERY DAY   METOPROLOL SUCCINATE (TOPROL-XL) 100 MG 24 HR TABLET    Take 1 tablet (100 mg total) by mouth daily. Take with or immediately following a meal.   MOMETASONE (NASONEX) 50 MCG/ACT NASAL SPRAY    Place 2 sprays into the nose daily as needed (allergies).   ONDANSETRON (ZOFRAN) 8 MG TABLET    Take by mouth every 8  (eight) hours as needed for nausea or vomiting.  Modified Medications   No medications on file  Discontinued Medications   No medications on file    Physical Exam:  Vitals:   01/01/21 1408  BP:  126/80  Pulse: 72  Temp: (!) 97.5 F (36.4 C)  TempSrc: Temporal  SpO2: 99%  Weight: 182 lb (82.6 kg)  Height: '5\' 1"'$  (1.549 m)   Body mass index is 34.39 kg/m. Wt Readings from Last 3 Encounters:  01/01/21 182 lb (82.6 kg)  11/13/20 182 lb 3.2 oz (82.6 kg)  08/17/20 178 lb (80.7 kg)    Physical Exam Constitutional:      General: She is not in acute distress.    Appearance: She is well-developed. She is not diaphoretic.  HENT:     Head: Normocephalic and atraumatic.     Mouth/Throat:     Pharynx: No oropharyngeal exudate.  Eyes:     Conjunctiva/sclera: Conjunctivae normal.     Pupils: Pupils are equal, round, and reactive to light.  Cardiovascular:     Rate and Rhythm: Normal rate and regular rhythm.     Heart sounds: Normal heart sounds.  Pulmonary:     Effort: Pulmonary effort is normal.     Breath sounds: Normal breath sounds.  Abdominal:     General: Bowel sounds are normal.     Palpations: Abdomen is soft.  Musculoskeletal:     Cervical back: Normal range of motion and neck supple.     Right lower leg: No edema.     Left lower leg: No edema.  Skin:    General: Skin is warm and dry.  Neurological:     Mental Status: She is alert.  Psychiatric:        Mood and Affect: Mood normal.    Labs reviewed: Basic Metabolic Panel: Recent Labs    03/02/20 1403  NA 138  K 4.2  CL 106  CO2 23  GLUCOSE 95  BUN 14  CREATININE 0.97*  CALCIUM 9.8  TSH 1.78   Liver Function Tests: Recent Labs    03/02/20 1403  AST 23  ALT 20  BILITOT 0.5  PROT 6.5   No results for input(s): LIPASE, AMYLASE in the last 8760 hours. No results for input(s): AMMONIA in the last 8760 hours. CBC: Recent Labs    03/02/20 1403  WBC 3.9  NEUTROABS 2,254  HGB 13.0  HCT 40.5   MCV 86.5  PLT 277   Lipid Panel: Recent Labs    03/02/20 1403  CHOL 176  HDL 61  LDLCALC 92  TRIG 136  CHOLHDL 2.9   TSH: Recent Labs    03/02/20 1403  TSH 1.78   A1C: Lab Results  Component Value Date   HGBA1C 5.7 (H) 03/02/2020     Assessment/Plan 1. Post herpetic neuralgia -plans to get shingrix vaccine. All lesions have healed.  -ongoing pain. Gabapentin 300 mg not effective.  Tramadol helping but making her lethegic. Uses as needed -will see if lidocaine patch helps .  - lidocaine (LIDODERM) 5 %; Place 2 patches onto the skin daily. Remove & Discard patch within 12 hours or as directed by MD  Dispense: 60 patch; Refill: 0  2. Essential hypertension Consider reduction of metoprolol. Will have her monitor bp at home and follow up in 1 month.   Next appt: 1 month.  Carlos American. [place], [facility] [phone]

## 2021-01-07 ENCOUNTER — Telehealth: Payer: Self-pay | Admitting: *Deleted

## 2021-01-07 NOTE — Telephone Encounter (Signed)
Received fax from [facility] [phone] requesting Prior Authorization for Lidocaine Patches.  Filled out Prior Authorization form and placed in [place] folder to review and sign.   To be faxed back to [facility] once completed. Fax: [phone]  Member #: [id number]

## 2021-01-11 NOTE — Telephone Encounter (Signed)
Received fax from [facility] stating Prior authorization was APPROVED through 06/05/2021

## 2021-01-12 ENCOUNTER — Telehealth: Payer: Self-pay | Admitting: *Deleted

## 2021-01-12 DIAGNOSIS — B0229 Other postherpetic nervous system involvement: Secondary | ICD-10-CM

## 2021-01-12 MED ORDER — TRAMADOL HCL 50 MG PO TABS: 50.0000 mg | ORAL_TABLET | Freq: Three times a day (TID) | ORAL | 0 refills | Status: AC | PRN

## 2021-01-12 NOTE — Telephone Encounter (Signed)
Patient called and stated that she has seen you for shingles and you told her that you would call her in her Tramadol '50mg'$  if she needed it for the pain.   Patient stated that she does need it and wonders if you would call it in for her.  Medication is not in current medication list.   Please Advise.

## 2021-01-12 NOTE — Telephone Encounter (Signed)
Yes will send in tramadol at this time. Was she able to get the lidocaine patches? Hopefully she is getting some relief.

## 2021-01-12 NOTE — Telephone Encounter (Signed)
LMOM

## 2021-01-13 NOTE — Telephone Encounter (Signed)
LMOM to return call.

## 2021-01-29 ENCOUNTER — Telehealth: Payer: Self-pay

## 2021-01-29 MED ORDER — LEVOTHYROXINE SODIUM 50 MCG PO TABS: 50.0000 ug | ORAL_TABLET | Freq: Every morning | ORAL | 0 refills | Status: DC

## 2021-01-29 NOTE — Telephone Encounter (Signed)
Patient called and requested a refill on her Levothyroxine 50 MCG and medication was send into pharmacy.

## 2021-02-01 ENCOUNTER — Other Ambulatory Visit: Payer: Self-pay

## 2021-02-01 ENCOUNTER — Ambulatory Visit (INDEPENDENT_AMBULATORY_CARE_PROVIDER_SITE_OTHER): Payer: Medicare HMO | Admitting: Nurse Practitioner

## 2021-02-01 ENCOUNTER — Encounter: Payer: Self-pay | Admitting: Nurse Practitioner

## 2021-02-01 VITALS — BP 130/74 | HR 69 | Temp 97.1°F | Resp 16 | Ht 61.0 in | Wt 179.6 lb

## 2021-02-01 DIAGNOSIS — Z23 Encounter for immunization: Secondary | ICD-10-CM

## 2021-02-01 DIAGNOSIS — E7800 Pure hypercholesterolemia, unspecified: Secondary | ICD-10-CM

## 2021-02-01 DIAGNOSIS — B0229 Other postherpetic nervous system involvement: Secondary | ICD-10-CM

## 2021-02-01 DIAGNOSIS — E039 Hypothyroidism, unspecified: Secondary | ICD-10-CM | POA: Diagnosis not present

## 2021-02-01 DIAGNOSIS — I10 Essential (primary) hypertension: Secondary | ICD-10-CM

## 2021-02-01 MED ORDER — METOPROLOL SUCCINATE ER 50 MG PO TB24: 100.0000 mg | ORAL_TABLET | Freq: Every day | ORAL | Status: DC

## 2021-02-01 NOTE — Progress Notes (Signed)
Careteam: Patient Care Team: Lauree Chandler, NP as PCP - General (Geriatric Medicine) Mammography, [facility] (Diagnostic Radiology) Jovita Gamma, MD as Consulting Physician (Neurosurgery) Wilford Corner, MD as Consulting Physician (Gastroenterology) Allyn Kenner, MD (Dermatology) Thornell Sartorius, MD (Otolaryngology)  PLACE OF SERVICE:  [place] Directive information Does Patient Have a Medical Advance Directive?: Yes, Type of Advance Directive: [place], Does patient want to make changes to medical advance directive?: No - Patient declined  Allergies  Allergen Reactions   Beta Adrenergic Blockers Other (See Comments)    Certain beta blockers make me hoarse.   Mobic [Meloxicam] Hypertension    Made blood pressure extremely high   Sulfa Antibiotics Itching and Swelling   Tape Rash    Paper tape   Symbicort [Budesonide-Formoterol Fumarate] Cough    Coughing up blood    Aspirin Other (See Comments)    Stomach burning; increased acid   Oxycodone Other (See Comments)    It makes her someone she's not     Chief Complaint  Patient presents with   Medical Management of Chronic Issues    1 month follow up.   Immunizations    Discuss the need for shingrix vaccine, 2nd covid booster, tetanus vaccine, and influenza vaccine.   Health Maintenance    Discuss the need for dexa scan.     HPI: Patient is a 75 y.o. female for 1 month follow up on blood pressure.   Having trouble hearing on right ear, followed by ENT. She told her to get the fluid out of her ears.   Damage to vocal cord from reflux.   Reflux- continues on nexium   Htn- does not check blood pressure at home routinely. 110/65 last time she was at home. She wants to cut back on metoprolol  She has cut back on sodium due to LE edema, when she eats diet high in sodium her leg swells.  She has increase in fatigue and contributes to toprol xl She takes care of her husband with  advance dementia.   Shingles pain has finally improved.    Review of Systems:  Review of Systems  Constitutional:  Positive for malaise/fatigue. Negative for chills, fever and weight loss.  HENT:  Positive for congestion. Negative for hearing loss, sore throat and tinnitus.   Respiratory:  Negative for cough, sputum production, shortness of breath and stridor.   Cardiovascular:  Negative for chest pain, palpitations and leg swelling.  Gastrointestinal:  Negative for abdominal pain, constipation, diarrhea and heartburn.  Genitourinary:  Negative for dysuria, frequency and urgency.  Musculoskeletal:  Negative for back pain, falls, joint pain and myalgias.  Skin: Negative.   Neurological:  Negative for dizziness and headaches.  Psychiatric/Behavioral:  Negative for depression and memory loss. The patient does not have insomnia.    Past Medical History:  Diagnosis Date   Arthritis    Asthma    " allergies"   Chronic back pain    Complication of anesthesia 1968   "fought the anesthesia" problems with memory 05/06/15 at [facility]"   Depression    Fibromyalgia    GERD (gastroesophageal reflux disease)    History of hiatal hernia    Hypertension    Hypothyroidism    PONV (postoperative nausea and vomiting)    Thyroid disease    Torn meniscus    left knee   Wears glasses    Past Surgical History:  Procedure Laterality Date   ABDOMINAL HYSTERECTOMY  1976   ANTERIOR CERVICAL DECOMP/DISCECTOMY FUSION N/A 05/06/2015   Procedure: Cervical four-five, Cervical five-six, Cervical six-seven Anterior cervical decompression/diskectomy/fusion;  Surgeon: Leeroy Cha, MD;  Location: [place] [facility];  Service: Neurosurgery;  Laterality: N/A;  C4-5 C5-6 C6-7 Anterior cervical decompression/diskectomy/fusion   APPENDECTOMY     BACK SURGERY     BREAST LUMPECTOMY WITH RADIOACTIVE SEED LOCALIZATION Right 11/15/2019   Procedure: RIGHT BREAST LUMPECTOMY WITH RADIOACTIVE SEED LOCALIZATION;  Surgeon:  [name], MD;  Location: [place];  Service: General;  Laterality: Right;   BREAST SURGERY Bilateral    biopsy- 2 different times   CARPAL TUNNEL RELEASE     B/L   CARPAL TUNNEL RELEASE Left 04/15/2020   CHOLECYSTECTOMY     COLONOSCOPY  2014   COLONOSCOPY W/ POLYPECTOMY     dental implants     DILATION AND CURETTAGE OF UTERUS  1968   ELBOW ARTHROSCOPY Right    right   FOOT GANGLION EXCISION Left    left   KNEE ARTHROSCOPY Right    torn ligament   LAMINECTOMY     L 4 and 5; 2 separate surgeries   left hand     carpal tunel   right hand     3 surgeries- carpal tunel   Social History:   reports that she has never smoked. She has never used smokeless tobacco. She reports that she does not drink alcohol and does not use drugs.  Family History  Problem Relation Age of Onset   Hypertension Mother    Lupus Mother    Heart attack Mother    Cancer Mother    Stroke Mother    Hypertension Father    Heart disease Father    Lupus Father    Heart attack Father    Heart attack Brother    Leukemia Brother    HIV/AIDS Sister    Leukemia Brother    Cancer Sister        intestinal   Lupus Sister    Breast cancer Sister    Diabetes Daughter    Diabetes Daughter    Fibromyalgia Daughter    Colon cancer Neg Hx     Medications: Patient's Medications  New Prescriptions   No medications on file  Previous Medications   ALBUTEROL (VENTOLIN HFA) 108 (90 BASE) MCG/ACT INHALER    Inhale 2 puffs into the lungs every 6 (six) hours as needed for wheezing or shortness of breath.   AMLODIPINE (NORVASC) 5 MG TABLET    TAKE 1 TABLET EVERY DAY   ANASTROZOLE (ARIMIDEX) 1 MG TABLET    Take 1 mg by mouth daily.   CALCIUM CARB-CHOLECALCIFEROL (CALCIUM/VITAMIN D) 600-400 MG-UNIT TABS    Take 1 tablet by mouth in the morning and at bedtime.   CYANOCOBALAMIN 2000 MCG TABLET    Take 2,000 mcg by mouth daily.   CYCLOBENZAPRINE (FLEXERIL) 10 MG TABLET    TAKE 1/2 TO 1 TABLET UP TO  TWICE DAILY AS NEEDED   ESOMEPRAZOLE (NEXIUM) 40 MG CAPSULE    Take 40 mg by mouth daily at 12 noon.   ESTAZOLAM (PROSOM) 2 MG TABLET    Take 1 tablet by mouth as needed at bedtime for muscle pain and insomnia   GLUCOSAMINE 500 MG CAPS    Take by mouth 2 (two) times daily.   LEVOTHYROXINE (SYNTHROID) 50 MCG TABLET    Take 1 tablet (50 mcg total) by mouth every morning.   LOSARTAN (COZAAR) 100 MG TABLET  TAKE 1 TABLET EVERY DAY   METOPROLOL SUCCINATE (TOPROL-XL) 100 MG 24 HR TABLET    Take 1 tablet (100 mg total) by mouth daily. Take with or immediately following a meal.   MOMETASONE (NASONEX) 50 MCG/ACT NASAL SPRAY    Place 2 sprays into the nose daily as needed (allergies).   ONDANSETRON (ZOFRAN) 8 MG TABLET    Take by mouth every 8 (eight) hours as needed for nausea or vomiting.  Modified Medications   No medications on file  Discontinued Medications   LIDOCAINE (LIDODERM) 5 %    Place 2 patches onto the skin daily. Remove & Discard patch within 12 hours or as directed by MD    Physical Exam:  Vitals:   02/01/21 1141  BP: 130/74  Pulse: 69  Resp: 16  Temp: (!) 97.1 F (36.2 C)  SpO2: 98%  Weight: 179 lb 9.6 oz (81.5 kg)  Height: $Remove'5\' 1"'hFkvvxr$  (1.549 m)   Body mass index is 33.94 kg/m. Wt Readings from Last 3 Encounters:  02/01/21 179 lb 9.6 oz (81.5 kg)  01/01/21 182 lb (82.6 kg)  11/13/20 182 lb 3.2 oz (82.6 kg)    Physical Exam Constitutional:      General: She is not in acute distress.    Appearance: She is well-developed. She is not diaphoretic.  HENT:     Head: Normocephalic and atraumatic.     Mouth/Throat:     Pharynx: No oropharyngeal exudate.  Eyes:     Conjunctiva/sclera: Conjunctivae normal.     Pupils: Pupils are equal, round, and reactive to light.  Cardiovascular:     Rate and Rhythm: Normal rate and regular rhythm.     Heart sounds: Normal heart sounds.  Pulmonary:     Effort: Pulmonary effort is normal.     Breath sounds: Normal breath sounds.   Abdominal:     General: Bowel sounds are normal.     Palpations: Abdomen is soft.  Musculoskeletal:     Cervical back: Normal range of motion and neck supple.     Right lower leg: No edema.     Left lower leg: No edema.  Skin:    General: Skin is warm and dry.  Neurological:     Mental Status: She is alert.  Psychiatric:        Mood and Affect: Mood normal.    Labs reviewed: Basic Metabolic Panel: Recent Labs    03/02/20 1403  NA 138  K 4.2  CL 106  CO2 23  GLUCOSE 95  BUN 14  CREATININE 0.97*  CALCIUM 9.8  TSH 1.78   Liver Function Tests: Recent Labs    03/02/20 1403  AST 23  ALT 20  BILITOT 0.5  PROT 6.5   No results for input(s): LIPASE, AMYLASE in the last 8760 hours. No results for input(s): AMMONIA in the last 8760 hours. CBC: Recent Labs    03/02/20 1403  WBC 3.9  NEUTROABS 2,254  HGB 13.0  HCT 40.5  MCV 86.5  PLT 277   Lipid Panel: Recent Labs    03/02/20 1403  CHOL 176  HDL 61  LDLCALC 92  TRIG 136  CHOLHDL 2.9   TSH: Recent Labs    03/02/20 1403  TSH 1.78   A1C: Lab Results  Component Value Date   HGBA1C 5.7 (H) 03/02/2020     Assessment/Plan 1. Need for influenza vaccination - Flu Vaccine QUAD High Dose(Fluad)  2. Post herpetic neuralgia Finally has improved. She is not  needing PRN medication at this time.  3. Essential hypertension Controlled at this time, pt feels like her betablocker is contributing to her overwhelming fatigue. Will decrease to 50 mg by mouth daily, to continue all other blood pressure medication as prescribed to check HR and BP at home. Low sodium diet.  - CMP with eGFR([facility]) - CBC with Differential/Platelet - metoprolol succinate (TOPROL-XL) 50 MG 24 hr tablet; Take 2 tablets (100 mg total) by mouth daily. Take with or immediately following a meal.  4. Acquired hypothyroidism -continues on synthroid 50 mcg - TSH  5. Pure hypercholesterolemia -continue dietary modifications.  - CMP with  eGFR([facility]) - Lipid panel   Next appt: 2 weeks.  Carlos American. [place], [facility] [phone]

## 2021-02-01 NOTE — Patient Instructions (Addendum)
Decrease metoprolol to 50 mg by mouth daily  Check blood pressure at home three times a week- AFTER ~1 hour you have had medication.  Record blood pressure and heart rate

## 2021-02-02 LAB — COMPLETE METABOLIC PANEL WITH GFR
AG Ratio: 1.5 (calc) (ref 1.0–2.5)
ALT: 27 U/L (ref 6–29)
AST: 30 U/L (ref 10–35)
Albumin: 4.1 g/dL (ref 3.6–5.1)
Alkaline phosphatase (APISO): 95 U/L (ref 37–153)
BUN: 13 mg/dL (ref 7–25)
CO2: 25 mmol/L (ref 20–32)
Calcium: 10 mg/dL (ref 8.6–10.4)
Chloride: 105 mmol/L (ref 98–110)
Creat: 0.93 mg/dL (ref 0.60–1.00)
Globulin: 2.8 g/dL (calc) (ref 1.9–3.7)
Glucose, Bld: 93 mg/dL (ref 65–99)
Potassium: 4.3 mmol/L (ref 3.5–5.3)
Sodium: 139 mmol/L (ref 135–146)
Total Bilirubin: 0.7 mg/dL (ref 0.2–1.2)
Total Protein: 6.9 g/dL (ref 6.1–8.1)
eGFR: 63 mL/min/{1.73_m2} (ref >=60)

## 2021-02-02 LAB — LIPID PANEL
Cholesterol: 169 mg/dL (ref <200)
HDL: 61 mg/dL (ref >=50)
LDL Cholesterol (Calc): 84 mg/dL (calc)
Non-HDL Cholesterol (Calc): 108 mg/dL (calc) (ref <130)
Total CHOL/HDL Ratio: 2.8 (calc) (ref <5.0)
Triglycerides: 142 mg/dL (ref <150)

## 2021-02-02 LAB — TSH: TSH: 1.62 mIU/L (ref 0.40–4.50)

## 2021-02-02 LAB — CBC WITH DIFFERENTIAL/PLATELET
Absolute Monocytes: 488 cells/uL (ref 200–950)
Basophils Absolute: 42 cells/uL (ref 0–200)
Basophils Relative: 0.8 %
Eosinophils Absolute: 0 cells/uL — ABNORMAL LOW (ref 15–500)
Eosinophils Relative: 0 %
HCT: 39.9 % (ref 35.0–45.0)
Hemoglobin: 12.7 g/dL (ref 11.7–15.5)
Lymphs Abs: 1897 cells/uL (ref 850–3900)
MCH: 28.5 pg (ref 27.0–33.0)
MCHC: 31.8 g/dL — ABNORMAL LOW (ref 32.0–36.0)
MCV: 89.5 fL (ref 80.0–100.0)
MPV: 9.9 fL (ref 7.5–12.5)
Monocytes Relative: 9.2 %
Neutro Abs: 2873 cells/uL (ref 1500–7800)
Neutrophils Relative %: 54.2 %
Platelets: 291 10*3/uL (ref 140–400)
RBC: 4.46 10*6/uL (ref 3.80–5.10)
RDW: 14.5 % (ref 11.0–15.0)
Total Lymphocyte: 35.8 %
WBC: 5.3 10*3/uL (ref 3.8–10.8)

## 2021-02-09 ENCOUNTER — Other Ambulatory Visit: Payer: Self-pay

## 2021-02-09 ENCOUNTER — Emergency Department (HOSPITAL_BASED_OUTPATIENT_CLINIC_OR_DEPARTMENT_OTHER)
Admission: EM | Admit: 2021-02-09 | Discharge: 2021-02-09 | Disposition: A | Discharge status: Home / Self Care | Payer: Medicare HMO | Attending: Emergency Medicine | Admitting: Emergency Medicine

## 2021-02-09 ENCOUNTER — Encounter (HOSPITAL_BASED_OUTPATIENT_CLINIC_OR_DEPARTMENT_OTHER): Payer: Self-pay | Admitting: Emergency Medicine

## 2021-02-09 DIAGNOSIS — Z7951 Long term (current) use of inhaled steroids: Secondary | ICD-10-CM | POA: Diagnosis not present

## 2021-02-09 DIAGNOSIS — I10 Essential (primary) hypertension: Secondary | ICD-10-CM | POA: Diagnosis not present

## 2021-02-09 DIAGNOSIS — Z79899 Other long term (current) drug therapy: Secondary | ICD-10-CM | POA: Insufficient documentation

## 2021-02-09 DIAGNOSIS — J4520 Mild intermittent asthma, uncomplicated: Secondary | ICD-10-CM | POA: Insufficient documentation

## 2021-02-09 DIAGNOSIS — E039 Hypothyroidism, unspecified: Secondary | ICD-10-CM | POA: Insufficient documentation

## 2021-02-09 DIAGNOSIS — Z853 Personal history of malignant neoplasm of breast: Secondary | ICD-10-CM | POA: Insufficient documentation

## 2021-02-09 DIAGNOSIS — R42 Dizziness and giddiness: Secondary | ICD-10-CM | POA: Diagnosis not present

## 2021-02-09 DIAGNOSIS — Z9011 Acquired absence of right breast and nipple: Secondary | ICD-10-CM | POA: Diagnosis not present

## 2021-02-09 HISTORY — DX: Dizziness and giddiness: R42

## 2021-02-09 MED ORDER — MECLIZINE HCL 25 MG PO TABS: 25.0000 mg | ORAL_TABLET | Freq: Three times a day (TID) | ORAL | 0 refills | Status: DC | PRN

## 2021-02-09 NOTE — ED Triage Notes (Signed)
Pt to ER with c/o vertigo for last month mostly with left ear.  States that the right ear is now worse and the vertigo has increased.  Pt states she has been seeing ENT and PCP and has been taking OTC meds for fluid on her ears.  Pt states she attempted to make appointments today, but was unable to get anything that was not several weeks out.

## 2021-02-09 NOTE — ED Provider Notes (Signed)
[facility] [facility] EMERGENCY DEPT Provider Note   CSN: [id number] Arrival date & time: 02/09/21  1707     History Chief Complaint  Patient presents with   Dizziness    Victoria Hudson is a 75 y.o. female.  75 year old female presents with dizziness consistent with her prior diagnosis of vertigo.  States that it is worse when she moves her head to any side.  Denies any headache.  No focal neurological deficits.  Is currently not taking any medications for this.  Denies any falls.  Denies any palpitations.  Has had some ear fullness on the left side.  Denies any active drainage.       Past Medical History:  Diagnosis Date   Arthritis    Asthma    " allergies"   Chronic back pain    Complication of anesthesia 1968   "fought the anesthesia" problems with memory 05/06/15 at [facility]"   Depression    Fibromyalgia    GERD (gastroesophageal reflux disease)    History of hiatal hernia    Hypertension    Hypothyroidism    PONV (postoperative nausea and vomiting)    Thyroid disease    Torn meniscus    left knee   Vertigo    Wears glasses     Patient Active Problem List   Diagnosis Date Noted   Malignant neoplasm of upper-outer quadrant of right breast in female, estrogen receptor positive ([place]) 12/04/2019   Essential hypertension 07/05/2019   Environmental and seasonal allergies 07/05/2019   DDD (degenerative disc disease), cervical 07/05/2019   Fibromyalgia 07/05/2019   Acquired hypothyroidism 07/05/2019   Mild intermittent asthma without complication [id number]   DDD (degenerative disc disease), lumbar 07/05/2019   Age-related osteoporosis without current pathological fracture 07/05/2019   Lumbar stenosis with neurogenic claudication 07/18/2016   Cervical stenosis of spinal canal 05/06/2015    Past Surgical History:  Procedure Laterality Date   ABDOMINAL HYSTERECTOMY  1976   ANTERIOR CERVICAL DECOMP/DISCECTOMY FUSION N/A 05/06/2015   Procedure:  Cervical four-five, Cervical five-six, Cervical six-seven Anterior cervical decompression/diskectomy/fusion;  Surgeon: Leeroy Cha, MD;  Location: [facility];  Service: Neurosurgery;  Laterality: N/A;  C4-5 C5-6 C6-7 Anterior cervical decompression/diskectomy/fusion   APPENDECTOMY     BACK SURGERY     BREAST LUMPECTOMY WITH RADIOACTIVE SEED LOCALIZATION Right 11/15/2019   Procedure: RIGHT BREAST LUMPECTOMY WITH RADIOACTIVE SEED LOCALIZATION;  Surgeon: [name], MD;  Location: [place];  Service: General;  Laterality: Right;   BREAST SURGERY Bilateral    biopsy- 2 different times   CARPAL TUNNEL RELEASE     B/L   CARPAL TUNNEL RELEASE Left 04/15/2020   CHOLECYSTECTOMY     COLONOSCOPY  2014   COLONOSCOPY W/ POLYPECTOMY     dental implants     DILATION AND CURETTAGE OF UTERUS  1968   ELBOW ARTHROSCOPY Right    right   FOOT GANGLION EXCISION Left    left   KNEE ARTHROSCOPY Right    torn ligament   LAMINECTOMY     L 4 and 5; 2 separate surgeries   left hand     carpal tunel   right hand     3 surgeries- carpal tunel     OB History   No obstetric history on file.     Family History  Problem Relation Age of Onset   Hypertension Mother    Lupus Mother    Heart attack Mother    Cancer Mother  Stroke Mother    Hypertension Father    Heart disease Father    Lupus Father    Heart attack Father    Heart attack Brother    Leukemia Brother    HIV/AIDS Sister    Leukemia Brother    Cancer Sister        intestinal   Lupus Sister    Breast cancer Sister    Diabetes Daughter    Diabetes Daughter    Fibromyalgia Daughter    Colon cancer Neg Hx     Social History   Tobacco Use   Smoking status: Never   Smokeless tobacco: Never  Vaping Use   Vaping Use: Never used  Substance Use Topics   Alcohol use: No   Drug use: No    Home Medications Prior to Admission medications   Medication Sig Start Date End Date Taking? Authorizing Provider   albuterol (VENTOLIN HFA) 108 (90 Base) MCG/ACT inhaler Inhale 2 puffs into the lungs every 6 (six) hours as needed for wheezing or shortness of breath. 12/16/20   Lauree Chandler, NP  amLODipine (NORVASC) 5 MG tablet TAKE 1 TABLET EVERY DAY 08/28/20   Lauree Chandler, NP  anastrozole (ARIMIDEX) 1 MG tablet Take 1 mg by mouth daily.    [provider]  Calcium Carb-Cholecalciferol (CALCIUM/VITAMIN D) 600-400 MG-UNIT TABS Take 1 tablet by mouth in the morning and at bedtime.    [provider]  cyanocobalamin 2000 MCG tablet Take 2,000 mcg by mouth daily.    [provider]  cyclobenzaprine (FLEXERIL) 10 MG tablet TAKE 1/2 TO 1 TABLET UP TO TWICE DAILY AS NEEDED 08/25/20   Lauree Chandler, NP  esomeprazole (NEXIUM) 40 MG capsule Take 40 mg by mouth daily at 12 noon.    [provider]  estazolam (PROSOM) 2 MG tablet Take 1 tablet by mouth as needed at bedtime for muscle pain and insomnia 12/22/20   Ngetich, Dinah C, NP  Glucosamine 500 MG CAPS Take by mouth 2 (two) times daily.    [provider]  levothyroxine (SYNTHROID) 50 MCG tablet Take 1 tablet (50 mcg total) by mouth every morning. 01/29/21   Lauree Chandler, NP  losartan (COZAAR) 100 MG tablet TAKE 1 TABLET EVERY DAY 08/28/20   Lauree Chandler, NP  metoprolol succinate (TOPROL-XL) 50 MG 24 hr tablet Take 2 tablets (100 mg total) by mouth daily. Take with or immediately following a meal. 02/01/21   Eubanks, Carlos American, NP  mometasone (NASONEX) 50 MCG/ACT nasal spray Place 2 sprays into the nose daily as needed (allergies).    [provider]  ondansetron (ZOFRAN) 8 MG tablet Take by mouth every 8 (eight) hours as needed for nausea or vomiting.    [provider]    Allergies    Beta adrenergic blockers, Mobic [meloxicam], Sulfa antibiotics, Tape, Symbicort [budesonide-formoterol fumarate], Aspirin, and Oxycodone  Review of Systems   Review of Systems  All other systems  reviewed and are negative.  Physical Exam Updated Vital Signs BP 125/64   Pulse 87   Temp 98.2 F (36.8 C)   Resp 18   Ht 1.549 m ('5\' 1"'$ )   Wt 81.2 kg   SpO2 100%   BMI 33.82 kg/m   Physical Exam Vitals and nursing note reviewed.  Constitutional:      General: She is not in acute distress.    Appearance: Normal appearance. She is well-developed. She is not toxic-appearing.  HENT:  Head: Normocephalic and atraumatic.  Eyes:     General: Lids are normal.     Conjunctiva/sclera: Conjunctivae normal.     Pupils: Pupils are equal, round, and reactive to light.  Neck:     Thyroid: No thyroid mass.     Trachea: No tracheal deviation.  Cardiovascular:     Rate and Rhythm: Normal rate and regular rhythm.     Heart sounds: Normal heart sounds. No murmur heard.   No gallop.  Pulmonary:     Effort: Pulmonary effort is normal. No respiratory distress.     Breath sounds: Normal breath sounds. No stridor. No decreased breath sounds, wheezing, rhonchi or rales.  Abdominal:     General: There is no distension.     Palpations: Abdomen is soft.     Tenderness: There is no abdominal tenderness. There is no rebound.  Musculoskeletal:        General: No tenderness. Normal range of motion.     Cervical back: Normal range of motion and neck supple.  Skin:    General: Skin is warm and dry.     Findings: No abrasion or rash.  Neurological:     General: No focal deficit present.     Mental Status: She is alert and oriented to person, place, and time. Mental status is at baseline.     GCS: GCS eye subscore is 4. GCS verbal subscore is 5. GCS motor subscore is 6.     Cranial Nerves: Cranial nerves are intact. No cranial nerve deficit.     Sensory: No sensory deficit.     Motor: Motor function is intact.     Comments: Horizontal nystagmus noted  Psychiatric:        Attention and Perception: Attention normal.        Speech: Speech normal.        Behavior: Behavior normal.    ED  Results / Procedures / Treatments   Labs (all labs ordered are listed, but only abnormal results are displayed) Labs Reviewed - No data to display  EKG None  Radiology No results found.  Procedures Procedures   Medications Ordered in ED Medications - No data to display  ED Course  I have reviewed the triage vital signs and the nursing notes.  Pertinent labs & imaging results that were available during my care of the patient were reviewed by me and considered in my medical decision making (see chart for details).    MDM Rules/Calculators/A&P                           Patient symptoms consistent with vertigo.  Will place on Antivert and she will follow-up with her doctor Final Clinical Impression(s) / ED Diagnoses Final diagnoses:  None    Rx / DC Orders ED Discharge Orders     None        Lacretia Leigh, MD 02/09/21 1800

## 2021-02-15 ENCOUNTER — Encounter: Payer: Self-pay | Admitting: Nurse Practitioner

## 2021-02-15 ENCOUNTER — Ambulatory Visit (INDEPENDENT_AMBULATORY_CARE_PROVIDER_SITE_OTHER): Payer: Medicare HMO | Admitting: Nurse Practitioner

## 2021-02-15 ENCOUNTER — Other Ambulatory Visit: Payer: Self-pay

## 2021-02-15 VITALS — BP 110/78 | HR 83 | Temp 97.1°F | Ht 61.0 in | Wt 180.0 lb

## 2021-02-15 DIAGNOSIS — I10 Essential (primary) hypertension: Secondary | ICD-10-CM | POA: Diagnosis not present

## 2021-02-15 DIAGNOSIS — E6609 Other obesity due to excess calories: Secondary | ICD-10-CM

## 2021-02-15 DIAGNOSIS — R42 Dizziness and giddiness: Secondary | ICD-10-CM | POA: Diagnosis not present

## 2021-02-15 DIAGNOSIS — Z6833 Body mass index (BMI) 33.0-33.9, adult: Secondary | ICD-10-CM

## 2021-02-15 DIAGNOSIS — J4520 Mild intermittent asthma, uncomplicated: Secondary | ICD-10-CM

## 2021-02-15 MED ORDER — ALBUTEROL SULFATE HFA 108 (90 BASE) MCG/ACT IN AERS: 2.0000 | INHALATION_SPRAY | Freq: Four times a day (QID) | RESPIRATORY_TRACT | 2 refills | Status: AC | PRN

## 2021-02-15 MED ORDER — METOPROLOL SUCCINATE ER 50 MG PO TB24: 50.0000 mg | ORAL_TABLET | Freq: Every day | ORAL | Status: DC

## 2021-02-15 NOTE — Patient Instructions (Signed)
Due for Tdap and Shingrix- can get at local pharmacy

## 2021-02-15 NOTE — Progress Notes (Signed)
Careteam: Patient Care Team: Lauree Chandler, NP as PCP - General (Geriatric Medicine) Mammography, [facility] (Diagnostic Radiology) Jovita Gamma, MD as Consulting Physician (Neurosurgery) Wilford Corner, MD as Consulting Physician (Gastroenterology) Allyn Kenner, MD (Dermatology) Thornell Sartorius, MD (Otolaryngology)  PLACE OF SERVICE:  [place] Directive information Does Patient Have a Medical Advance Directive?: Yes, Type of Advance Directive: [place], Does patient want to make changes to medical advance directive?: No - Patient declined  Allergies  Allergen Reactions   Beta Adrenergic Blockers Other (See Comments)    Certain beta blockers make me hoarse.   Mobic [Meloxicam] Hypertension    Made blood pressure extremely high   Sulfa Antibiotics Itching and Swelling   Tape Rash    Paper tape   Symbicort [Budesonide-Formoterol Fumarate] Cough    Coughing up blood    Aspirin Other (See Comments)    Stomach burning; increased acid   Oxycodone Other (See Comments)    It makes her someone she's not     Chief Complaint  Patient presents with   Follow-up    2 week blood pressure follow-up      HPI: Patient is a 75 y.o. female to follow up blood pressure.  Reports she has decreased her metoprolol down to 50 mg at bedtime. States blood pressure is controlled at home sbp <130/70s.  Reports she has more energy since she has reduced her dose. She was able to cook several meals and 8 cakes.   In the meantime went to ED due to vertigo. Started on meclizine and it has improved  Has a hard time moving when she was so fatigue and had shingles now feeling better . Review of Systems:  Review of Systems  Constitutional:  Negative for chills, fever and weight loss.  HENT:  Negative for tinnitus.   Respiratory:  Negative for cough, sputum production and shortness of breath.   Cardiovascular:  Negative for chest pain, palpitations and leg  swelling.  Gastrointestinal:  Negative for abdominal pain, constipation, diarrhea and heartburn.  Genitourinary:  Negative for dysuria, frequency and urgency.  Musculoskeletal:  Negative for back pain, falls, joint pain and myalgias.  Skin: Negative.   Neurological:  Positive for dizziness. Negative for headaches.  Psychiatric/Behavioral:  Negative for depression and memory loss. The patient does not have insomnia.    Past Medical History:  Diagnosis Date   Arthritis    Asthma    " allergies"   Chronic back pain    Complication of anesthesia 1968   "fought the anesthesia" problems with memory 05/06/15 at [facility]"   Depression    Fibromyalgia    GERD (gastroesophageal reflux disease)    History of hiatal hernia    Hypertension    Hypothyroidism    PONV (postoperative nausea and vomiting)    Thyroid disease    Torn meniscus    left knee   Vertigo    Wears glasses    Past Surgical History:  Procedure Laterality Date   ABDOMINAL HYSTERECTOMY  1976   ANTERIOR CERVICAL DECOMP/DISCECTOMY FUSION N/A 05/06/2015   Procedure: Cervical four-five, Cervical five-six, Cervical six-seven Anterior cervical decompression/diskectomy/fusion;  Surgeon: Leeroy Cha, MD;  Location: [facility];  Service: Neurosurgery;  Laterality: N/A;  C4-5 C5-6 C6-7 Anterior cervical decompression/diskectomy/fusion   APPENDECTOMY     BACK SURGERY     BREAST LUMPECTOMY WITH RADIOACTIVE SEED LOCALIZATION Right 11/15/2019   Procedure: RIGHT BREAST LUMPECTOMY WITH RADIOACTIVE SEED LOCALIZATION;  Surgeon:  [name], MD;  Location: [place];  Service: General;  Laterality: Right;   BREAST SURGERY Bilateral    biopsy- 2 different times   CARPAL TUNNEL RELEASE     B/L   CARPAL TUNNEL RELEASE Left 04/15/2020   CHOLECYSTECTOMY     COLONOSCOPY  2014   COLONOSCOPY W/ POLYPECTOMY     dental implants     DILATION AND CURETTAGE OF UTERUS  1968   ELBOW ARTHROSCOPY Right    right   FOOT  GANGLION EXCISION Left    left   KNEE ARTHROSCOPY Right    torn ligament   LAMINECTOMY     L 4 and 5; 2 separate surgeries   left hand     carpal tunel   right hand     3 surgeries- carpal tunel   Social History:   reports that she has never smoked. She has never used smokeless tobacco. She reports that she does not drink alcohol and does not use drugs.  Family History  Problem Relation Age of Onset   Hypertension Mother    Lupus Mother    Heart attack Mother    Cancer Mother    Stroke Mother    Hypertension Father    Heart disease Father    Lupus Father    Heart attack Father    Heart attack Brother    Leukemia Brother    HIV/AIDS Sister    Leukemia Brother    Cancer Sister        intestinal   Lupus Sister    Breast cancer Sister    Diabetes Daughter    Diabetes Daughter    Fibromyalgia Daughter    Colon cancer Neg Hx     Medications: Patient's Medications  New Prescriptions   No medications on file  Previous Medications   ALBUTEROL (VENTOLIN HFA) 108 (90 BASE) MCG/ACT INHALER    Inhale 2 puffs into the lungs every 6 (six) hours as needed for wheezing or shortness of breath.   AMLODIPINE (NORVASC) 5 MG TABLET    TAKE 1 TABLET EVERY DAY   ANASTROZOLE (ARIMIDEX) 1 MG TABLET    Take 1 mg by mouth daily.   CALCIUM CARB-CHOLECALCIFEROL (CALCIUM/VITAMIN D) 600-400 MG-UNIT TABS    Take 1 tablet by mouth in the morning and at bedtime.   CYANOCOBALAMIN 2000 MCG TABLET    Take 2,000 mcg by mouth daily.   CYCLOBENZAPRINE (FLEXERIL) 10 MG TABLET    TAKE 1/2 TO 1 TABLET UP TO TWICE DAILY AS NEEDED   ESOMEPRAZOLE (NEXIUM) 40 MG CAPSULE    Take 40 mg by mouth daily at 12 noon.   ESTAZOLAM (PROSOM) 2 MG TABLET    Take 1 tablet by mouth as needed at bedtime for muscle pain and insomnia   GLUCOSAMINE 500 MG CAPS    Take by mouth 2 (two) times daily.   LEVOTHYROXINE (SYNTHROID) 50 MCG TABLET    Take 1 tablet (50 mcg total) by mouth every morning.   LOSARTAN (COZAAR) 100 MG TABLET     TAKE 1 TABLET EVERY DAY   MECLIZINE (ANTIVERT) 25 MG TABLET    Take 1 tablet (25 mg total) by mouth 3 (three) times daily as needed for dizziness.   METOPROLOL SUCCINATE (TOPROL-XL) 50 MG 24 HR TABLET    Take 2 tablets (100 mg total) by mouth daily. Take with or immediately following a meal.   MOMETASONE (NASONEX) 50 MCG/ACT NASAL SPRAY    Place 2 sprays  into the nose daily as needed (allergies).   ONDANSETRON (ZOFRAN) 8 MG TABLET    Take by mouth every 8 (eight) hours as needed for nausea or vomiting.  Modified Medications   No medications on file  Discontinued Medications   No medications on file    Physical Exam:  Vitals:   02/15/21 1313  BP: 110/78  Pulse: 83  Temp: (!) 97.1 F (36.2 C)  TempSrc: Temporal  SpO2: 95%  Weight: 180 lb (81.6 kg)  Height: '5\' 1"'$  (1.549 m)   Body mass index is 34.01 kg/m. Wt Readings from Last 3 Encounters:  02/15/21 180 lb (81.6 kg)  02/09/21 179 lb (81.2 kg)  02/01/21 179 lb 9.6 oz (81.5 kg)    Physical Exam Constitutional:      General: She is not in acute distress.    Appearance: She is well-developed. She is not diaphoretic.  HENT:     Head: Normocephalic and atraumatic.     Mouth/Throat:     Pharynx: No oropharyngeal exudate.  Eyes:     Conjunctiva/sclera: Conjunctivae normal.     Pupils: Pupils are equal, round, and reactive to light.  Cardiovascular:     Rate and Rhythm: Normal rate and regular rhythm.     Heart sounds: Normal heart sounds.  Pulmonary:     Effort: Pulmonary effort is normal.     Breath sounds: Normal breath sounds.  Abdominal:     General: Bowel sounds are normal.     Palpations: Abdomen is soft.  Musculoskeletal:     Cervical back: Normal range of motion and neck supple.     Right lower leg: No edema.     Left lower leg: No edema.  Skin:    General: Skin is warm and dry.  Neurological:     Mental Status: She is alert.  Psychiatric:        Mood and Affect: Mood normal.    Labs reviewed: Basic  Metabolic Panel: Recent Labs    03/02/20 1403 02/01/21 1202  NA 138 139  K 4.2 4.3  CL 106 105  CO2 23 25  GLUCOSE 95 93  BUN 14 13  CREATININE 0.97* 0.93  CALCIUM 9.8 10.0  TSH 1.78 1.62   Liver Function Tests: Recent Labs    03/02/20 1403 02/01/21 1202  AST 23 30  ALT 20 27  BILITOT 0.5 0.7  PROT 6.5 6.9   No results for input(s): LIPASE, AMYLASE in the last 8760 hours. No results for input(s): AMMONIA in the last 8760 hours. CBC: Recent Labs    03/02/20 1403 02/01/21 1202  WBC 3.9 5.3  NEUTROABS 2,254 2,873  HGB 13.0 12.7  HCT 40.5 39.9  MCV 86.5 89.5  PLT 277 291   Lipid Panel: Recent Labs    03/02/20 1403 02/01/21 1202  CHOL 176 169  HDL 61 61  LDLCALC 92 84  TRIG 136 142  CHOLHDL 2.9 2.8   TSH: Recent Labs    03/02/20 1403 02/01/21 1202  TSH 1.78 1.62   A1C: Lab Results  Component Value Date   HGBA1C 5.7 (H) 03/02/2020     Assessment/Plan 1. Essential hypertension -well controlled at this time. Better energy with reduction of toprol and blood pressure remains at goal. Continue current regimen. She has also reduced sodium intake.  - metoprolol succinate (TOPROL-XL) 50 MG 24 hr tablet; Take 1 tablet (50 mg total) by mouth daily. Take with or immediately following a meal.  2. Mild intermittent asthma without  complication -stable. Request refill  - albuterol (VENTOLIN HFA) 108 (90 Base) MCG/ACT inhaler; Inhale 2 puffs into the lungs every 6 (six) hours as needed for wheezing or shortness of breath.  Dispense: 18 g; Refill: 2  3. Vertigo Improved, continues on meclizine PRN  4. Class 1 obesity due to excess calories with serious comorbidity and body mass index (BMI) of 33.0 to 33.9 in adult --education provided on healthy weight loss through increase in physical activity and proper nutrition    Next appt: 4 months. Carlos American. [place], [facility] [phone]

## 2021-02-23 ENCOUNTER — Other Ambulatory Visit: Payer: Self-pay | Admitting: *Deleted

## 2021-02-23 DIAGNOSIS — H906 Mixed conductive and sensorineural hearing loss, bilateral: Secondary | ICD-10-CM | POA: Diagnosis not present

## 2021-02-23 DIAGNOSIS — H6593 Unspecified nonsuppurative otitis media, bilateral: Secondary | ICD-10-CM | POA: Diagnosis not present

## 2021-02-23 DIAGNOSIS — I10 Essential (primary) hypertension: Secondary | ICD-10-CM

## 2021-02-23 MED ORDER — LOSARTAN POTASSIUM 100 MG PO TABS: 100.0000 mg | ORAL_TABLET | Freq: Every day | ORAL | 1 refills | Status: AC

## 2021-02-23 NOTE — Telephone Encounter (Signed)
[facility] requested refill.  

## 2021-03-08 DIAGNOSIS — H6502 Acute serous otitis media, left ear: Secondary | ICD-10-CM | POA: Diagnosis not present

## 2021-03-15 DIAGNOSIS — H903 Sensorineural hearing loss, bilateral: Secondary | ICD-10-CM | POA: Diagnosis not present

## 2021-03-15 DIAGNOSIS — Z8669 Personal history of other diseases of the nervous system and sense organs: Secondary | ICD-10-CM | POA: Diagnosis not present

## 2021-03-15 DIAGNOSIS — M5080 Other cervical disc disorders, unspecified cervical region: Secondary | ICD-10-CM | POA: Diagnosis not present

## 2021-03-15 DIAGNOSIS — H73891 Other specified disorders of tympanic membrane, right ear: Secondary | ICD-10-CM | POA: Diagnosis not present

## 2021-03-15 DIAGNOSIS — H938X3 Other specified disorders of ear, bilateral: Secondary | ICD-10-CM | POA: Diagnosis not present

## 2021-03-15 DIAGNOSIS — R42 Dizziness and giddiness: Secondary | ICD-10-CM | POA: Diagnosis not present

## 2021-03-29 ENCOUNTER — Other Ambulatory Visit: Payer: Self-pay | Admitting: Nurse Practitioner

## 2021-03-29 DIAGNOSIS — I10 Essential (primary) hypertension: Secondary | ICD-10-CM

## 2021-04-07 DIAGNOSIS — Z170 Estrogen receptor positive status [ER+]: Secondary | ICD-10-CM | POA: Diagnosis not present

## 2021-04-07 DIAGNOSIS — C50411 Malignant neoplasm of upper-outer quadrant of right female breast: Secondary | ICD-10-CM | POA: Diagnosis not present

## 2021-04-15 ENCOUNTER — Other Ambulatory Visit: Payer: Self-pay | Admitting: Nurse Practitioner

## 2021-04-16 NOTE — Telephone Encounter (Signed)
Patient has request refill on medication "Cyclobenzaprine". Patient last refill was on 08/25/2020. Medication has warnings. Medication pend and sent to PCP Lauree Chandler, NP . Please Advise.

## 2021-04-26 ENCOUNTER — Other Ambulatory Visit: Payer: Self-pay | Admitting: Nurse Practitioner

## 2021-05-03 DIAGNOSIS — M545 Low back pain, unspecified: Secondary | ICD-10-CM | POA: Diagnosis not present

## 2021-05-03 DIAGNOSIS — M25551 Pain in right hip: Secondary | ICD-10-CM | POA: Diagnosis not present

## 2021-05-04 DIAGNOSIS — R053 Chronic cough: Secondary | ICD-10-CM | POA: Diagnosis not present

## 2021-05-04 DIAGNOSIS — J4520 Mild intermittent asthma, uncomplicated: Secondary | ICD-10-CM | POA: Diagnosis not present

## 2021-05-05 ENCOUNTER — Other Ambulatory Visit: Payer: Self-pay | Admitting: *Deleted

## 2021-05-05 DIAGNOSIS — I10 Essential (primary) hypertension: Secondary | ICD-10-CM

## 2021-05-05 MED ORDER — METOPROLOL SUCCINATE ER 50 MG PO TB24: 50.0000 mg | ORAL_TABLET | Freq: Every day | ORAL | Status: DC

## 2021-05-05 NOTE — Telephone Encounter (Signed)
[facility] Requested refill Pended Rx and sent to [facility] for approval due to [place].

## 2021-05-07 ENCOUNTER — Other Ambulatory Visit: Payer: Self-pay | Admitting: *Deleted

## 2021-05-07 DIAGNOSIS — I10 Essential (primary) hypertension: Secondary | ICD-10-CM

## 2021-05-07 MED ORDER — METOPROLOL SUCCINATE ER 50 MG PO TB24: 50.0000 mg | ORAL_TABLET | Freq: Every day | ORAL | 1 refills | Status: DC

## 2021-05-07 NOTE — Telephone Encounter (Signed)
[facility] requested refill.  Pended Rx and sent to [facility] for approval due to [place].

## 2021-05-11 ENCOUNTER — Other Ambulatory Visit: Payer: Self-pay | Admitting: *Deleted

## 2021-05-11 DIAGNOSIS — I10 Essential (primary) hypertension: Secondary | ICD-10-CM

## 2021-05-11 MED ORDER — METOPROLOL SUCCINATE ER 50 MG PO TB24: 50.0000 mg | ORAL_TABLET | Freq: Every day | ORAL | 1 refills | Status: AC

## 2021-05-11 NOTE — Telephone Encounter (Signed)
Patient called and stated that she has never received the Rx for Metoprolol from [facility]. Stated that they told her they never received RF.   Patient stated that she is out of the medication and needs a refill sent to [facility] instead.   Pended Rx and sent to [facility] for approval due to [place].

## 2021-05-26 DIAGNOSIS — H6591 Unspecified nonsuppurative otitis media, right ear: Secondary | ICD-10-CM | POA: Diagnosis not present

## 2021-05-26 DIAGNOSIS — Z9622 Myringotomy tube(s) status: Secondary | ICD-10-CM | POA: Diagnosis not present

## 2021-05-26 DIAGNOSIS — H6983 Other specified disorders of Eustachian tube, bilateral: Secondary | ICD-10-CM | POA: Diagnosis not present

## 2021-06-17 DIAGNOSIS — M65332 Trigger finger, left middle finger: Secondary | ICD-10-CM | POA: Diagnosis not present

## 2021-06-17 DIAGNOSIS — M65341 Trigger finger, right ring finger: Secondary | ICD-10-CM | POA: Diagnosis not present

## 2021-06-17 DIAGNOSIS — M13849 Other specified arthritis, unspecified hand: Secondary | ICD-10-CM | POA: Diagnosis not present

## 2021-06-17 DIAGNOSIS — R52 Pain, unspecified: Secondary | ICD-10-CM | POA: Diagnosis not present

## 2021-06-21 ENCOUNTER — Ambulatory Visit: Payer: Medicare HMO | Admitting: Nurse Practitioner

## 2021-06-28 DIAGNOSIS — M797 Fibromyalgia: Secondary | ICD-10-CM | POA: Diagnosis not present

## 2021-06-28 DIAGNOSIS — I10 Essential (primary) hypertension: Secondary | ICD-10-CM | POA: Diagnosis not present

## 2021-06-28 DIAGNOSIS — K219 Gastro-esophageal reflux disease without esophagitis: Secondary | ICD-10-CM | POA: Diagnosis not present

## 2021-06-28 DIAGNOSIS — E039 Hypothyroidism, unspecified: Secondary | ICD-10-CM | POA: Diagnosis not present

## 2021-07-07 DIAGNOSIS — R7301 Impaired fasting glucose: Secondary | ICD-10-CM | POA: Diagnosis not present

## 2021-07-22 DIAGNOSIS — H7402 Tympanosclerosis, left ear: Secondary | ICD-10-CM | POA: Diagnosis not present

## 2021-07-22 DIAGNOSIS — H6983 Other specified disorders of Eustachian tube, bilateral: Secondary | ICD-10-CM | POA: Diagnosis not present

## 2021-07-22 DIAGNOSIS — Z9622 Myringotomy tube(s) status: Secondary | ICD-10-CM | POA: Diagnosis not present

## 2021-07-22 DIAGNOSIS — H903 Sensorineural hearing loss, bilateral: Secondary | ICD-10-CM | POA: Diagnosis not present

## 2021-07-22 DIAGNOSIS — J342 Deviated nasal septum: Secondary | ICD-10-CM | POA: Diagnosis not present

## 2021-08-16 DIAGNOSIS — H903 Sensorineural hearing loss, bilateral: Secondary | ICD-10-CM | POA: Diagnosis not present

## 2021-08-16 DIAGNOSIS — H6983 Other specified disorders of Eustachian tube, bilateral: Secondary | ICD-10-CM | POA: Diagnosis not present

## 2021-08-16 DIAGNOSIS — J3489 Other specified disorders of nose and nasal sinuses: Secondary | ICD-10-CM | POA: Diagnosis not present

## 2021-09-02 DIAGNOSIS — E039 Hypothyroidism, unspecified: Secondary | ICD-10-CM | POA: Diagnosis not present

## 2021-09-02 DIAGNOSIS — Z0000 Encounter for general adult medical examination without abnormal findings: Secondary | ICD-10-CM | POA: Diagnosis not present

## 2021-09-02 DIAGNOSIS — E1165 Type 2 diabetes mellitus with hyperglycemia: Secondary | ICD-10-CM | POA: Diagnosis not present

## 2021-09-02 DIAGNOSIS — I10 Essential (primary) hypertension: Secondary | ICD-10-CM | POA: Diagnosis not present

## 2021-09-05 IMAGING — MG MM BREAST LOCALIZATION CLIP
3 series · 3 of 7 positions shown · non-contrast
Comparison: Prior exams.

CLINICAL DATA: 74-year-old female presenting for biopsy of a right
breast mass. Patient had a benign biopsy of this mass in January 2019
at [place] with interval growth, re-biopsy was recommended. There is a
coil biopsy marking clip from the prior biopsy.

EXAM:
DIAGNOSTIC RIGHT MAMMOGRAM POST STEREOTACTIC BIOPSY

[R LM synth-2D]
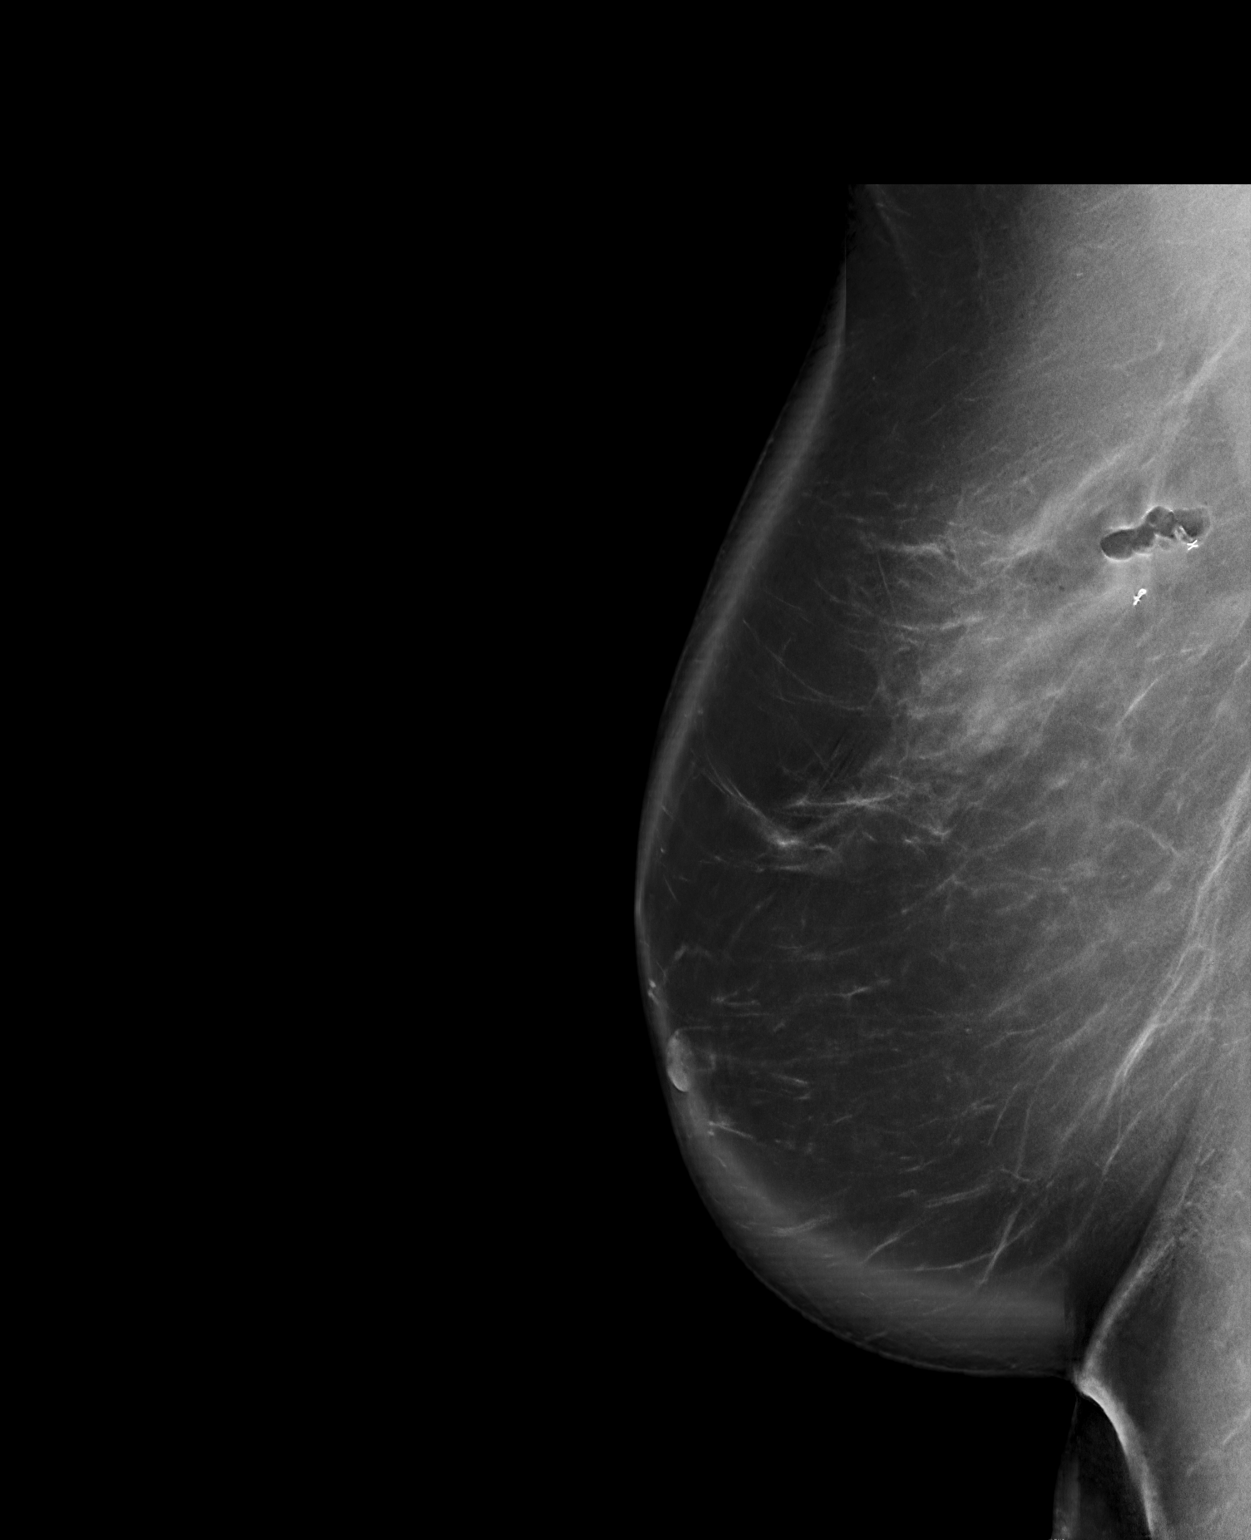

[R CC synth-2D]
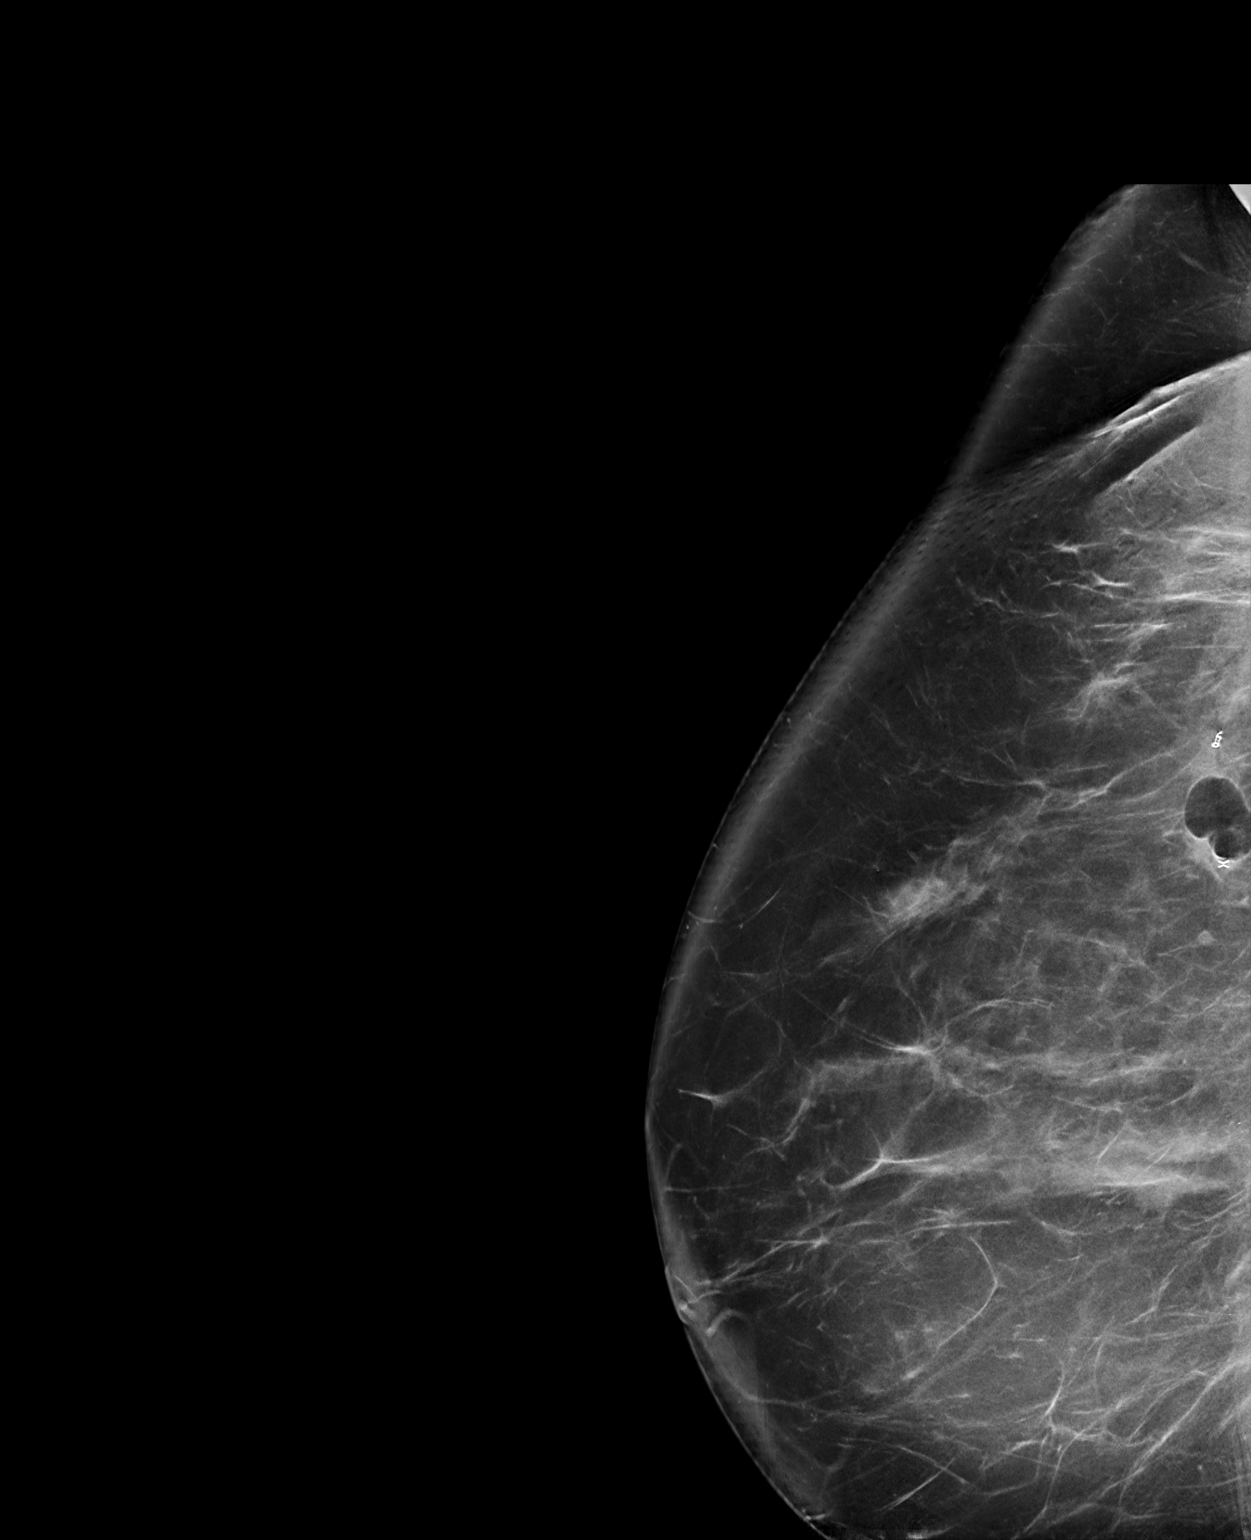

[R CC tomo · tomo slice 57/114.0]
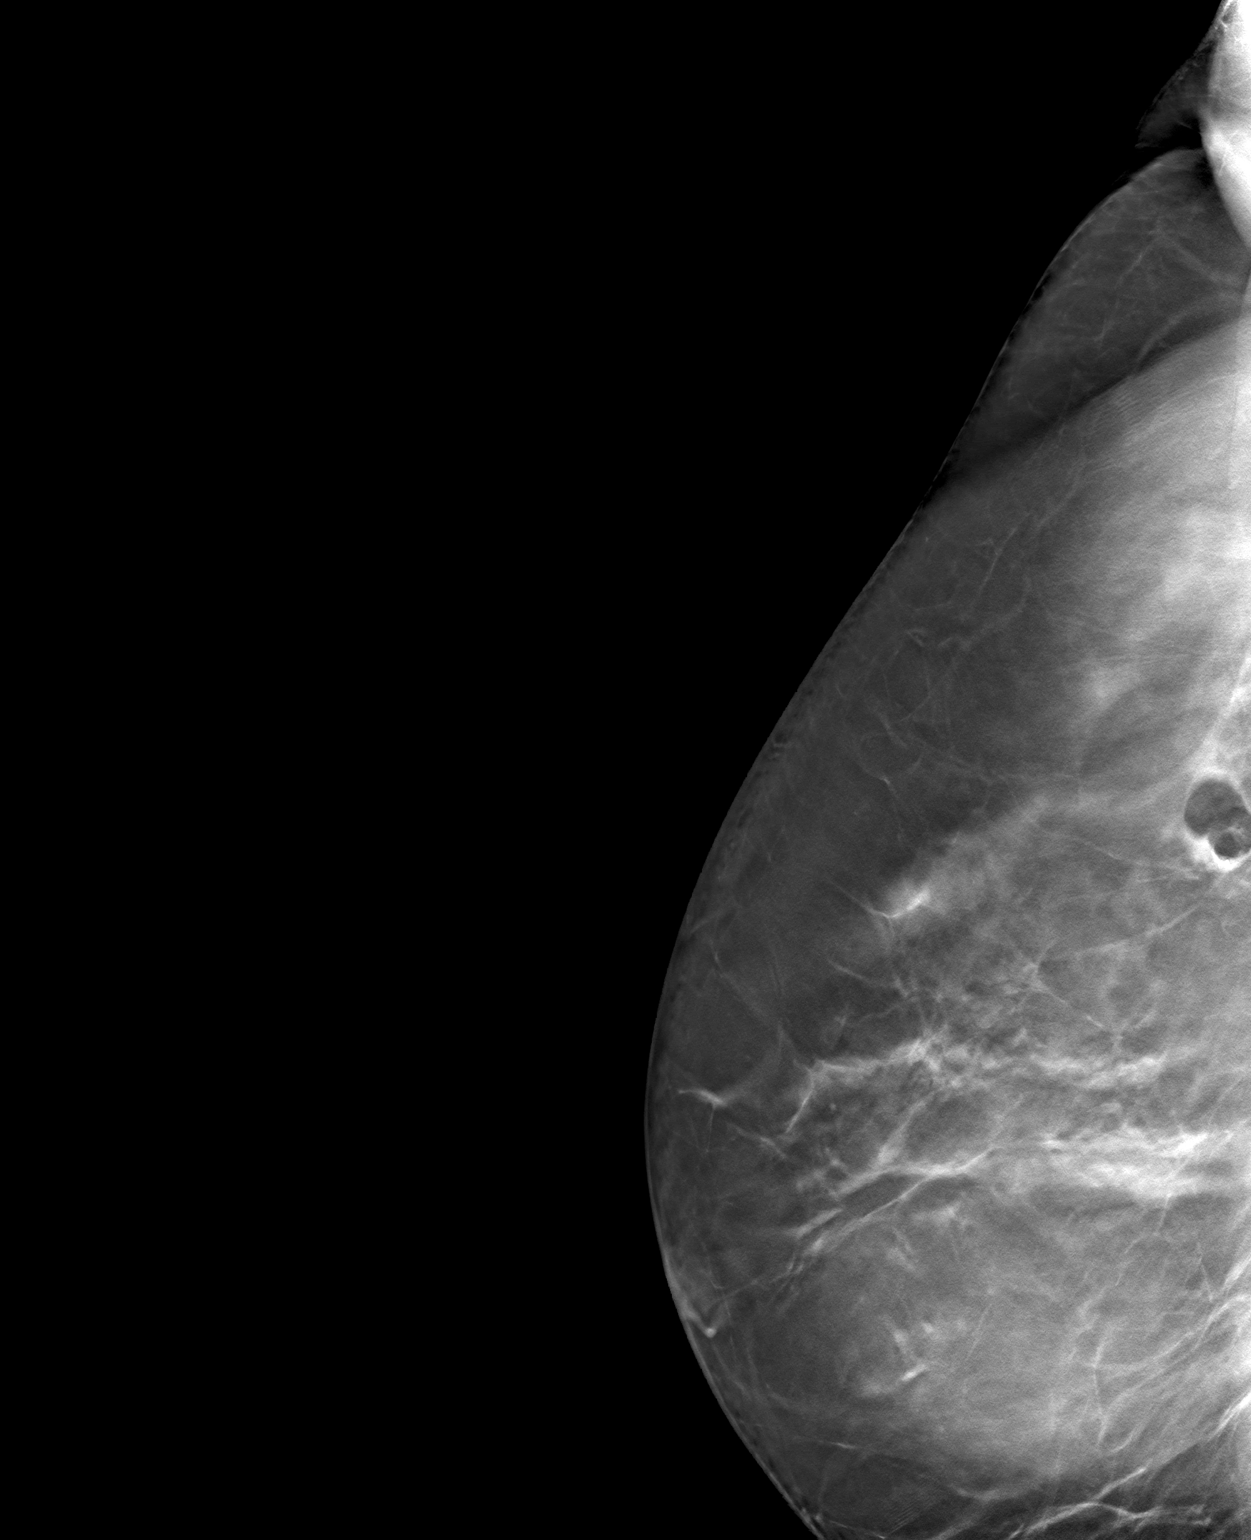

[3 of 7 positions shown; findings below may reference images not displayed]

FINDINGS: Mammographic images were obtained following stereotactic guided
biopsy of an upper outer right breast mass. The X biopsy marking
clip is in expected position at the site of biopsy.
IMPRESSION: Appropriate positioning of the X shaped biopsy marking clip at the
site of biopsy in the upper outer right breast.

Final Assessment: Post Procedure Mammograms for Marker Placement

## 2021-09-06 DIAGNOSIS — G8929 Other chronic pain: Secondary | ICD-10-CM | POA: Diagnosis not present

## 2021-09-06 DIAGNOSIS — J309 Allergic rhinitis, unspecified: Secondary | ICD-10-CM | POA: Diagnosis not present

## 2021-09-06 DIAGNOSIS — M797 Fibromyalgia: Secondary | ICD-10-CM | POA: Diagnosis not present

## 2021-09-06 DIAGNOSIS — K219 Gastro-esophageal reflux disease without esophagitis: Secondary | ICD-10-CM | POA: Diagnosis not present

## 2021-09-06 DIAGNOSIS — M5136 Other intervertebral disc degeneration, lumbar region: Secondary | ICD-10-CM | POA: Diagnosis not present

## 2021-09-06 DIAGNOSIS — Z79899 Other long term (current) drug therapy: Secondary | ICD-10-CM | POA: Diagnosis not present

## 2021-09-06 DIAGNOSIS — E1142 Type 2 diabetes mellitus with diabetic polyneuropathy: Secondary | ICD-10-CM | POA: Diagnosis not present

## 2021-09-06 DIAGNOSIS — Z7984 Long term (current) use of oral hypoglycemic drugs: Secondary | ICD-10-CM | POA: Diagnosis not present

## 2021-09-06 DIAGNOSIS — M1990 Unspecified osteoarthritis, unspecified site: Secondary | ICD-10-CM | POA: Diagnosis not present

## 2021-09-06 DIAGNOSIS — F32A Depression, unspecified: Secondary | ICD-10-CM | POA: Diagnosis not present

## 2021-09-06 DIAGNOSIS — E785 Hyperlipidemia, unspecified: Secondary | ICD-10-CM | POA: Diagnosis not present

## 2021-09-06 DIAGNOSIS — E039 Hypothyroidism, unspecified: Secondary | ICD-10-CM | POA: Diagnosis not present

## 2021-09-06 DIAGNOSIS — J45909 Unspecified asthma, uncomplicated: Secondary | ICD-10-CM | POA: Diagnosis not present

## 2021-09-06 DIAGNOSIS — I10 Essential (primary) hypertension: Secondary | ICD-10-CM | POA: Diagnosis not present

## 2021-09-06 DIAGNOSIS — G5603 Carpal tunnel syndrome, bilateral upper limbs: Secondary | ICD-10-CM | POA: Diagnosis not present

## 2021-09-06 DIAGNOSIS — G4700 Insomnia, unspecified: Secondary | ICD-10-CM | POA: Diagnosis not present

## 2021-09-21 IMAGING — MG MM DIGITAL DIAGNOSTIC UNILAT*L* W/ TOMO W/ CAD
4 series · 4 of 12 positions shown · non-contrast
Comparison: Previous exam(s).

CLINICAL DATA: 74-year-old female with 2 recent discordant benign
breast biopsies of the RIGHT breast, scheduled for RIGHT breast
excision. Presents today for screening evaluation of the LEFT breast
and ultrasound evaluation of the RIGHT axilla prior to surgical
consultation.

EXAM:
DIGITAL DIAGNOSTIC LEFT MAMMOGRAM WITH CAD AND TOMO
ULTRASOUND RIGHT BREAST

[L CC synth-2D]
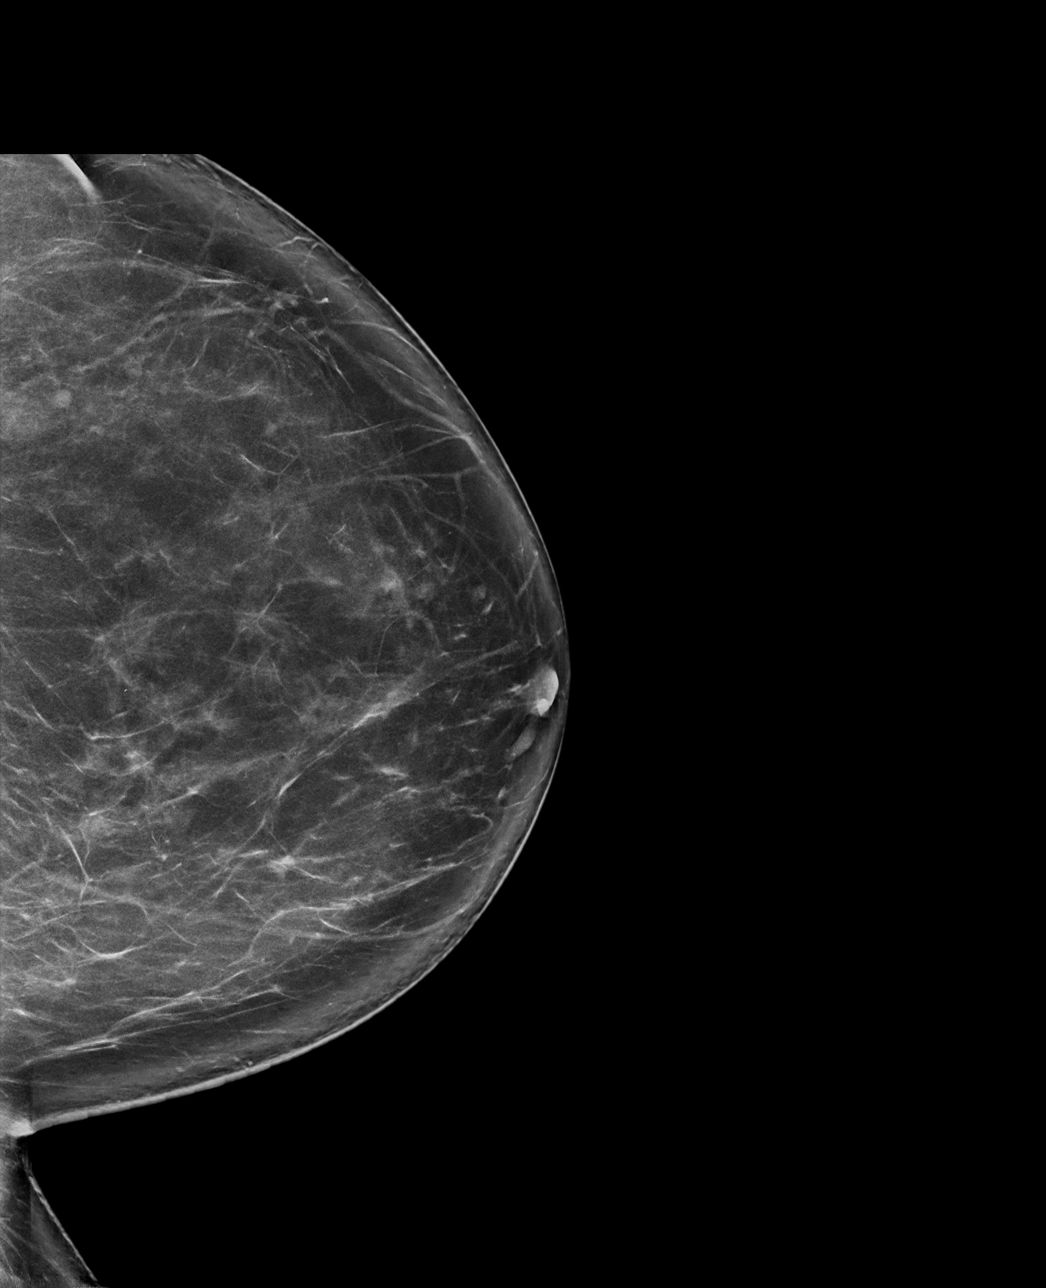

[L MLO synth-2D]
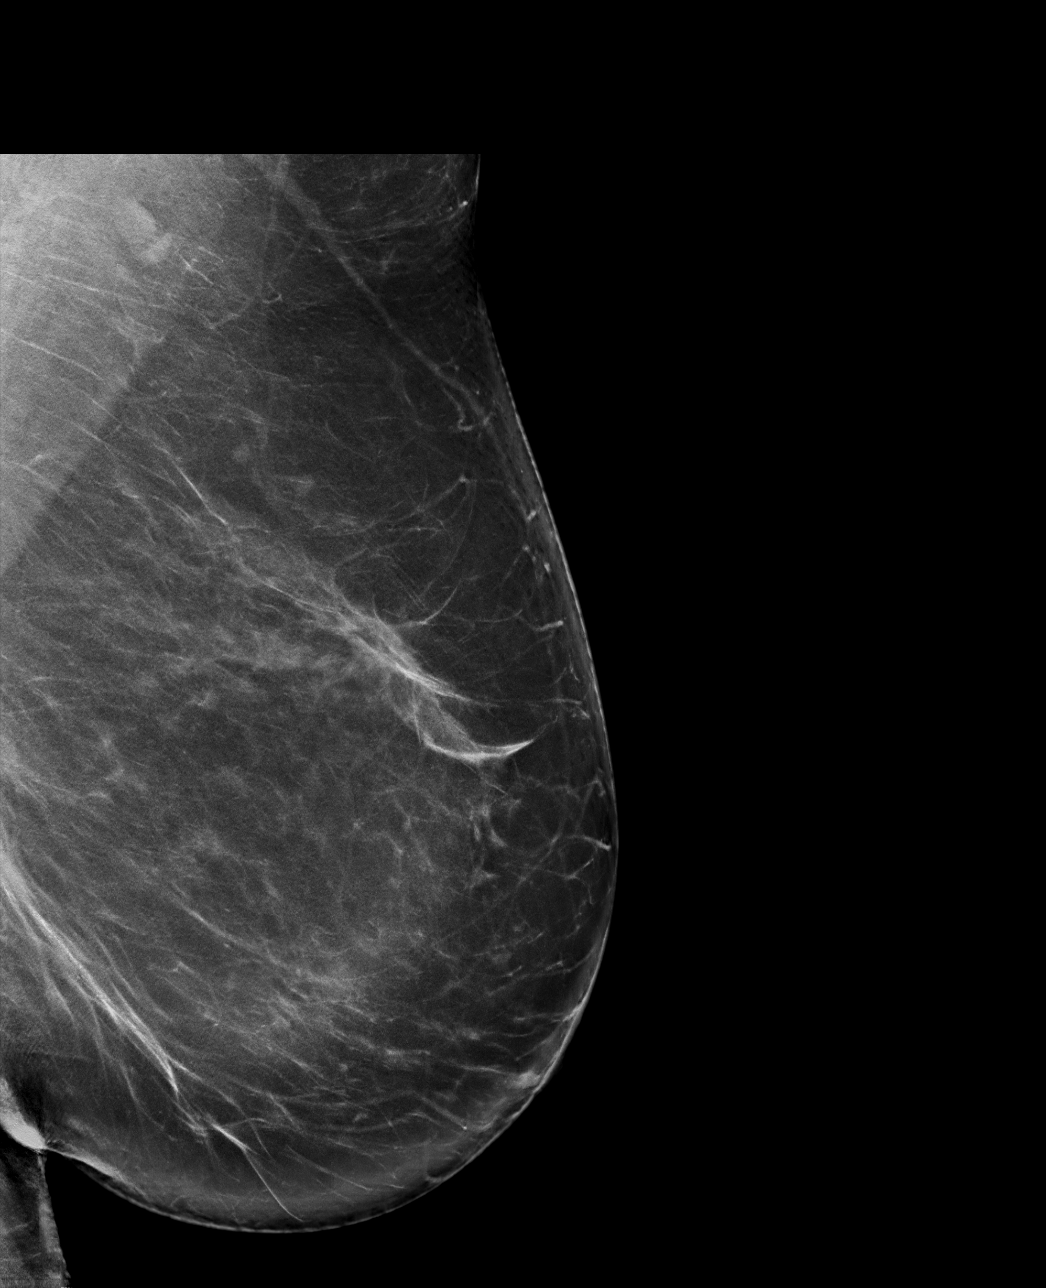

[L MLO tomo · tomo slice 53/105.0]
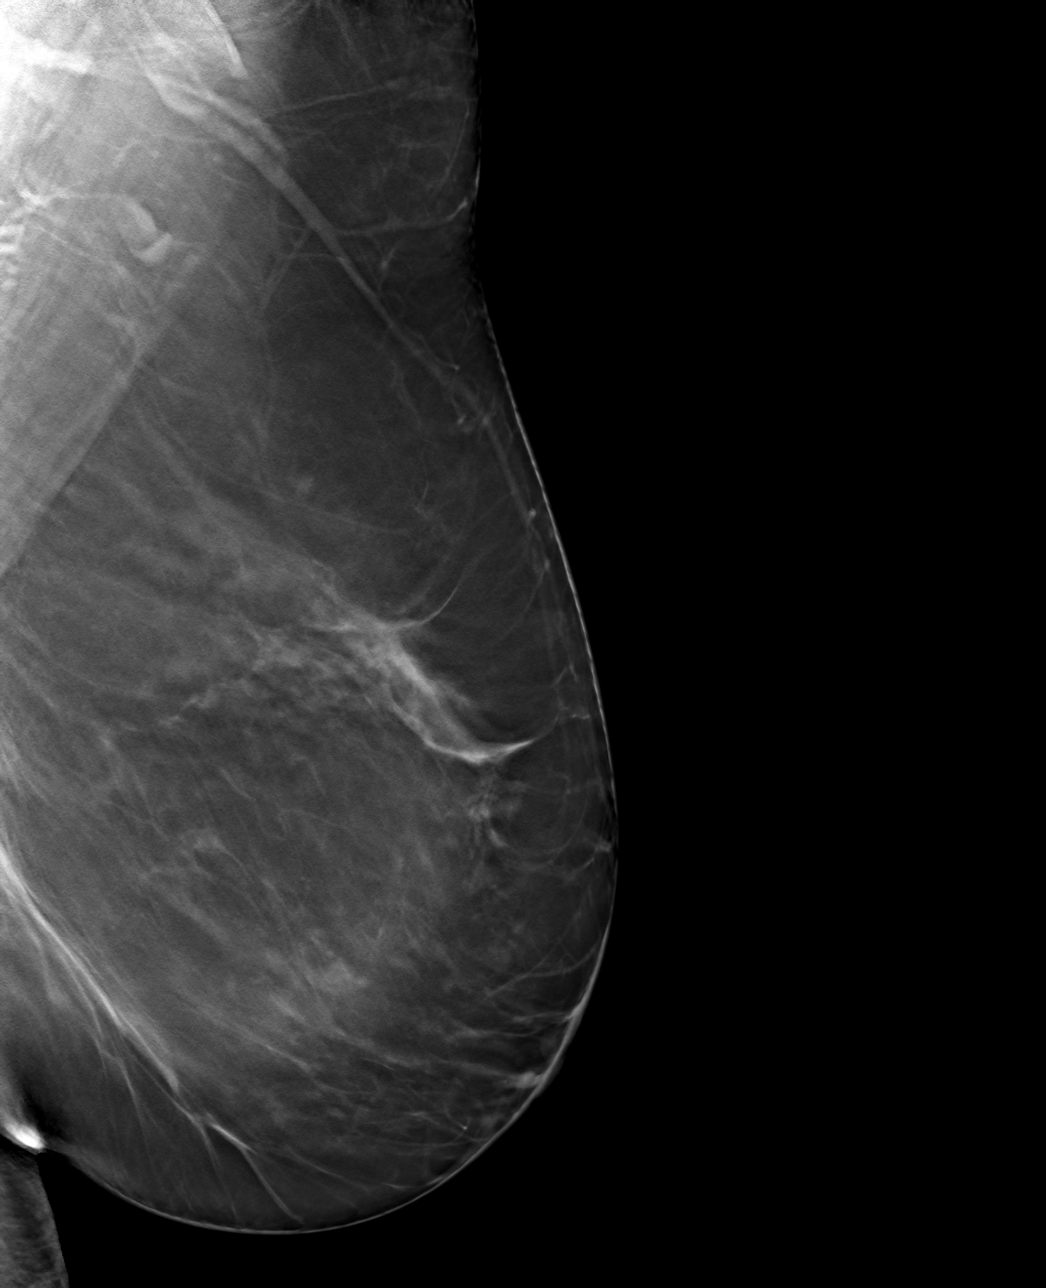

[L CC tomo · tomo slice 48/95.0]
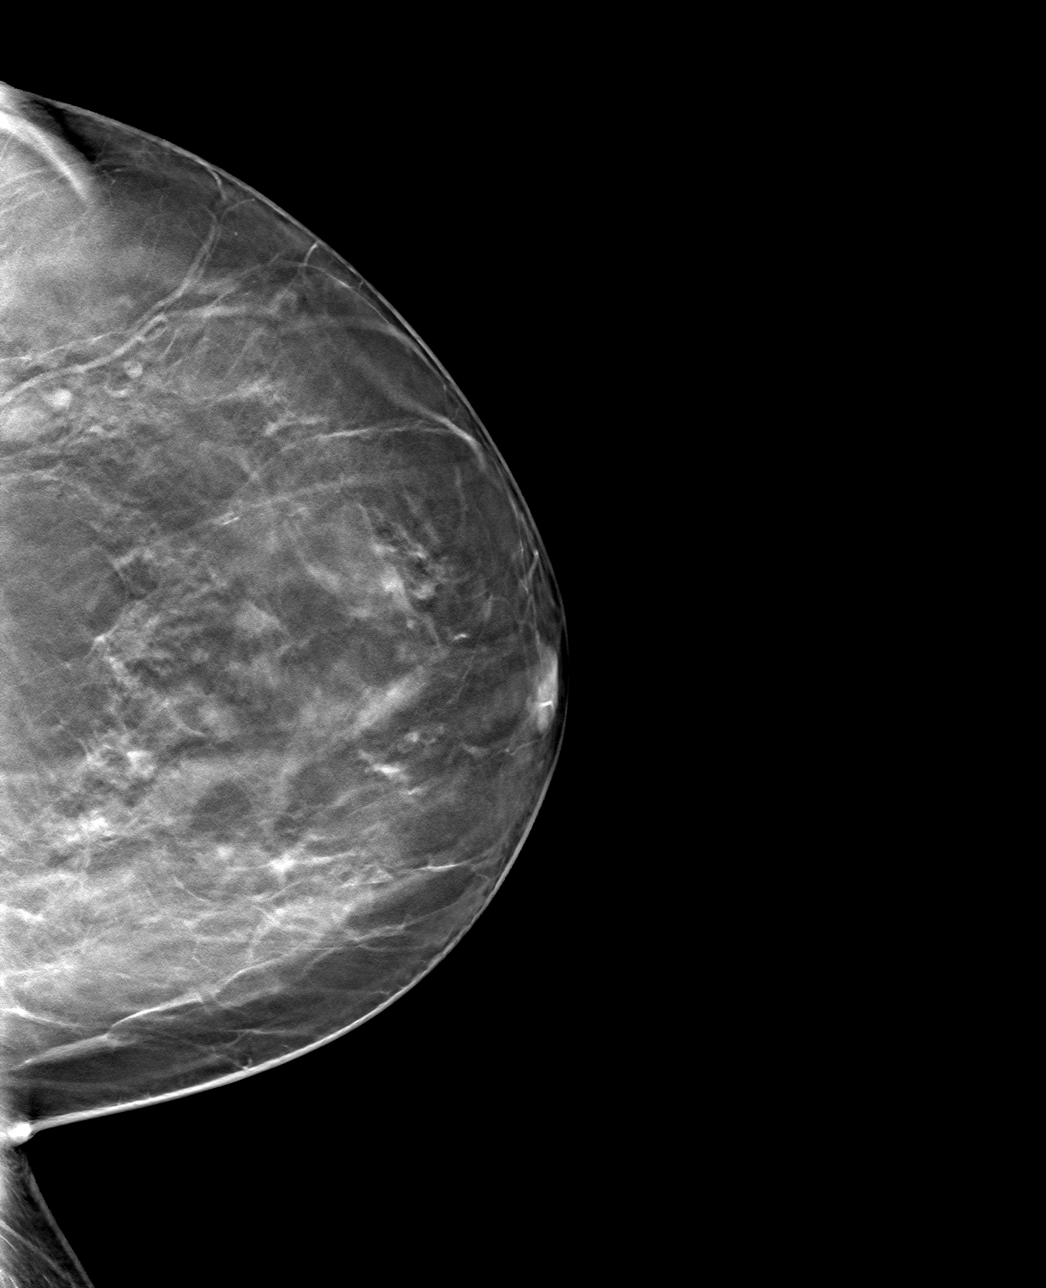

[4 of 12 positions shown; findings below may reference images not displayed]

ACR Breast Density Category b: There are scattered areas of
fibroglandular density.
FINDINGS: 2D/3D full field views of the LEFT breast demonstrate no suspicious
mass, distortion or worrisome calcifications.

Mammographic images were processed with CAD.

Targeted ultrasound is performed, showing no abnormal RIGHT axillary
lymph nodes.
IMPRESSION: 1. No abnormal RIGHT axillary lymph nodes.
2. No mammographic evidence of LEFT breast malignancy.

RECOMMENDATION:
Treatment plan for RIGHT breast. The patient has an appointment with
Dr. Deyvson ([REDACTED]) on 10/10/2019.

I have discussed the findings and recommendations with the patient.
If applicable, a reminder letter will be sent to the patient
regarding the next appointment.

BI-RADS CATEGORY  1: Negative.

## 2021-10-06 DIAGNOSIS — M797 Fibromyalgia: Secondary | ICD-10-CM | POA: Diagnosis not present

## 2021-10-06 DIAGNOSIS — I10 Essential (primary) hypertension: Secondary | ICD-10-CM | POA: Diagnosis not present

## 2021-10-06 DIAGNOSIS — J309 Allergic rhinitis, unspecified: Secondary | ICD-10-CM | POA: Diagnosis not present

## 2021-10-06 DIAGNOSIS — E785 Hyperlipidemia, unspecified: Secondary | ICD-10-CM | POA: Diagnosis not present

## 2021-10-06 DIAGNOSIS — E1142 Type 2 diabetes mellitus with diabetic polyneuropathy: Secondary | ICD-10-CM | POA: Diagnosis not present

## 2021-10-06 DIAGNOSIS — F32A Depression, unspecified: Secondary | ICD-10-CM | POA: Diagnosis not present

## 2021-10-06 DIAGNOSIS — G5603 Carpal tunnel syndrome, bilateral upper limbs: Secondary | ICD-10-CM | POA: Diagnosis not present

## 2021-10-06 DIAGNOSIS — E039 Hypothyroidism, unspecified: Secondary | ICD-10-CM | POA: Diagnosis not present

## 2021-10-06 DIAGNOSIS — K219 Gastro-esophageal reflux disease without esophagitis: Secondary | ICD-10-CM | POA: Diagnosis not present

## 2021-10-06 DIAGNOSIS — M5136 Other intervertebral disc degeneration, lumbar region: Secondary | ICD-10-CM | POA: Diagnosis not present

## 2021-10-06 DIAGNOSIS — G8929 Other chronic pain: Secondary | ICD-10-CM | POA: Diagnosis not present

## 2021-10-06 DIAGNOSIS — Z79899 Other long term (current) drug therapy: Secondary | ICD-10-CM | POA: Diagnosis not present

## 2021-10-06 DIAGNOSIS — M1990 Unspecified osteoarthritis, unspecified site: Secondary | ICD-10-CM | POA: Diagnosis not present

## 2021-10-06 DIAGNOSIS — Z7984 Long term (current) use of oral hypoglycemic drugs: Secondary | ICD-10-CM | POA: Diagnosis not present

## 2021-10-06 DIAGNOSIS — J45909 Unspecified asthma, uncomplicated: Secondary | ICD-10-CM | POA: Diagnosis not present

## 2021-10-06 DIAGNOSIS — G4700 Insomnia, unspecified: Secondary | ICD-10-CM | POA: Diagnosis not present

## 2021-11-09 ENCOUNTER — Encounter: Payer: Medicare HMO | Admitting: Nurse Practitioner

## 2021-11-10 ENCOUNTER — Encounter: Payer: Medicare Other | Attending: Internal Medicine | Admitting: Registered"

## 2021-11-10 DIAGNOSIS — E119 Type 2 diabetes mellitus without complications: Secondary | ICD-10-CM | POA: Diagnosis not present

## 2021-11-10 NOTE — Patient Instructions (Addendum)
Instead of taking metformin with breakfast, it might be easier on your stomach if you take with dinner instead. Consider having sugar free lozenges to help with dry mouth and throat.  Diet: Aim for more protein with your breakfast. When you have cereal for breakfast, don't have fruit with it and have more protein with it. Use the Snack Sheet for ideas for balanced snacks  Start checking your blood sugar 2 specific times: Fasting before breakfast target 80-130 mg/dL 2 hours after a meal target less than 180 mg/dL

## 2021-11-10 NOTE — Progress Notes (Signed)
Diabetes Self-Management Education  Visit Type: First/Initial  Appt. Start Time: 1110 Appt. End Time: 1225  11/11/2021  [name], identified by name and date of birth, is a 76 y.o. female with a diagnosis of Diabetes: Type 2.   ASSESSMENT  There were no vitals taken for this visit. There is no height or weight on file to calculate BMI.  A1c: 7.1% Medication: 500 mg metformin bid; pt is taking only once per day d/t side effects, pt states she has discussed with her MD.  Pt reports checking blood glucose FBS and qhs FBS: 132 mg/dL, 128 mg/Dl last night before bed PPBG: ~4 hours after meal 187 mg/dL after [employer] meal and small ice cream cone, soda  Pt states she has been focusing on taking care of her husband who has dementia, diabetes and had a stroke with brain bleed recently.   Pt reports barriers to care for herself:  Pt cooks and cleans and cares for husband and with pain in hands, carpal tunnel and arthritis, and does not have a lot of energy to take care for herself, putting off neck surgery. Pt states husband doesn't like the smell/taste of meat, and she doesn't want to cook 2 different meals and may not be getting enough protein. Pt wants ideas how to get enough protein with minimal meat and not too much cooking.  Pt reports that all 3 of her daughters has diabetes.  Pt reports silent reflux that causes dry mouth and hoarse voice, eats fruit snacks. Pt report she does not like [employer] products.  Pt states she used to walk an hour but lately has been more difficultly so does some leg lifts and other exercise in bed, dizziness has caused 2 falls recently. Pt states some of her medications contribute to dizziness.   Diabetes Self-Management Education - 11/10/21 1124       Visit Information   Visit Type First/Initial      Initial Visit   Diabetes Type Type 2    Date Diagnosed 11/03/21    Are you currently following a meal plan? Yes    What type of meal  plan do you follow? trying to avoid carb      Health Coping   How would you rate your overall health? Excellent      Psychosocial Assessment   Patient Belief/Attitude about Diabetes Defeat/Burnout    How often do you need to have someone help you when you read instructions, pamphlets, or other written materials from your doctor or pharmacy? 1 - Never    What is the last grade level you completed in school? BA      Complications   Last HgB A1C per patient/outside source 7.1 %    How often do you check your blood sugar? 1-2 times/day    Have you had a dilated eye exam in the past 12 months? No    Have you had a dental exam in the past 12 months? Yes    Are you checking your feet? Yes    How many days per week are you checking your feet? 5      Dietary Intake   Breakfast wheat bread, 2 T peanut butter, banana OR 1/2 c cereal, 1 c milk, banana    Snack (morning) glucerna 15 g pro OR slimfast drink    Lunch barbeque sandwich, hot dog,    Snack (afternoon) carrots OR fig bar    Dinner fish sandwich [employer], fries OR  Snack (evening) smores 4 graham crackers, marshmallows, hershey kiss OR banana OR [place] nuts OR fruit snacks    Beverage(s) water,      Activity / Exercise   Activity / Exercise Type Light (walking / raking leaves)    How many days per week do you exercise? 4    How many minutes per day do you exercise? 15    Total minutes per week of exercise 60      Patient Education   Previous Diabetes Education No      Individualized Goals (developed by patient)   Nutrition General guidelines for healthy choices and portions discussed    Medications Other (comment)   talk to MD about side effects   Monitoring  Test my blood glucose as discussed      Outcomes   Expected Outcomes Demonstrated interest in learning. Expect positive outcomes    Future DMSE 2 months    Program Status Not Completed            Individualized Plan for Diabetes Self-Management Training:    Learning Objective:  Patient will have a greater understanding of diabetes self-management. Patient education plan is to attend individual and/or group sessions per assessed needs and concerns.  Patient Instructions  Instead of taking metformin with breakfast, it might be easier on your stomach if you take with dinner instead. Consider having sugar free lozenges to help with dry mouth and throat.  Diet: Aim for more protein with your breakfast. When you have cereal for breakfast, don't have fruit with it and have more protein with it. Use the Snack Sheet for ideas for balanced snacks  Start checking your blood sugar 2 specific times: Fasting before breakfast target 80-130 mg/dL 2 hours after a meal target less than 180 mg/dL  Expected Outcomes:  Demonstrated interest in learning. Expect positive outcomes  Education material provided: A1C conversion sheet, My Plate, Snack sheet, and Carbohydrate counting sheet  If problems or questions, patient to contact team via:  Phone  Future DSME appointment: 2 months

## 2021-11-12 DIAGNOSIS — Z6833 Body mass index (BMI) 33.0-33.9, adult: Secondary | ICD-10-CM | POA: Diagnosis not present

## 2021-11-12 DIAGNOSIS — M542 Cervicalgia: Secondary | ICD-10-CM | POA: Diagnosis not present

## 2021-11-12 DIAGNOSIS — S129XXA Fracture of neck, unspecified, initial encounter: Secondary | ICD-10-CM | POA: Diagnosis not present

## 2021-12-16 DIAGNOSIS — R928 Other abnormal and inconclusive findings on diagnostic imaging of breast: Secondary | ICD-10-CM | POA: Diagnosis not present

## 2021-12-16 DIAGNOSIS — Z853 Personal history of malignant neoplasm of breast: Secondary | ICD-10-CM | POA: Diagnosis not present

## 2021-12-31 DIAGNOSIS — H903 Sensorineural hearing loss, bilateral: Secondary | ICD-10-CM | POA: Diagnosis not present

## 2022-01-11 DIAGNOSIS — E1165 Type 2 diabetes mellitus with hyperglycemia: Secondary | ICD-10-CM | POA: Diagnosis not present

## 2022-01-11 DIAGNOSIS — M797 Fibromyalgia: Secondary | ICD-10-CM | POA: Diagnosis not present

## 2022-01-11 DIAGNOSIS — E039 Hypothyroidism, unspecified: Secondary | ICD-10-CM | POA: Diagnosis not present

## 2022-01-11 DIAGNOSIS — M130 Polyarthritis, unspecified: Secondary | ICD-10-CM | POA: Diagnosis not present

## 2022-01-18 ENCOUNTER — Other Ambulatory Visit: Payer: Self-pay | Admitting: Neurological Surgery

## 2022-01-18 DIAGNOSIS — S129XXA Fracture of neck, unspecified, initial encounter: Secondary | ICD-10-CM

## 2022-01-24 ENCOUNTER — Encounter: Payer: Medicare Other | Admitting: Registered"

## 2022-01-26 DIAGNOSIS — M13842 Other specified arthritis, left hand: Secondary | ICD-10-CM | POA: Diagnosis not present

## 2022-01-26 DIAGNOSIS — M65331 Trigger finger, right middle finger: Secondary | ICD-10-CM | POA: Diagnosis not present

## 2022-01-26 DIAGNOSIS — M65352 Trigger finger, left little finger: Secondary | ICD-10-CM | POA: Diagnosis not present

## 2022-01-26 DIAGNOSIS — M13841 Other specified arthritis, right hand: Secondary | ICD-10-CM | POA: Diagnosis not present

## 2022-02-08 ENCOUNTER — Other Ambulatory Visit: Payer: Self-pay | Admitting: Neurological Surgery

## 2022-02-08 DIAGNOSIS — M47816 Spondylosis without myelopathy or radiculopathy, lumbar region: Secondary | ICD-10-CM

## 2022-02-08 DIAGNOSIS — M542 Cervicalgia: Secondary | ICD-10-CM

## 2022-02-10 ENCOUNTER — Other Ambulatory Visit: Payer: Medicare Other

## 2022-02-11 DIAGNOSIS — M47816 Spondylosis without myelopathy or radiculopathy, lumbar region: Secondary | ICD-10-CM | POA: Diagnosis not present

## 2022-02-11 DIAGNOSIS — S129XXA Fracture of neck, unspecified, initial encounter: Secondary | ICD-10-CM | POA: Diagnosis not present

## 2022-02-11 DIAGNOSIS — M542 Cervicalgia: Secondary | ICD-10-CM | POA: Diagnosis not present

## 2022-02-11 DIAGNOSIS — M4302 Spondylolysis, cervical region: Secondary | ICD-10-CM | POA: Diagnosis not present

## 2022-02-19 ENCOUNTER — Other Ambulatory Visit: Payer: Medicare Other

## 2022-02-22 ENCOUNTER — Ambulatory Visit (HOSPITAL_BASED_OUTPATIENT_CLINIC_OR_DEPARTMENT_OTHER): Payer: Medicare Other | Admitting: Cardiovascular Disease

## 2022-02-22 ENCOUNTER — Encounter (HOSPITAL_BASED_OUTPATIENT_CLINIC_OR_DEPARTMENT_OTHER): Payer: Self-pay | Admitting: Cardiovascular Disease

## 2022-02-22 DIAGNOSIS — I10 Essential (primary) hypertension: Secondary | ICD-10-CM

## 2022-02-22 DIAGNOSIS — I517 Cardiomegaly: Secondary | ICD-10-CM | POA: Diagnosis not present

## 2022-02-22 DIAGNOSIS — Z8249 Family history of ischemic heart disease and other diseases of the circulatory system: Secondary | ICD-10-CM

## 2022-02-22 HISTORY — DX: Family history of ischemic heart disease and other diseases of the circulatory system: Z82.49

## 2022-02-22 HISTORY — DX: Cardiomegaly: I51.7

## 2022-02-22 NOTE — Assessment & Plan Note (Signed)
Blood pressure well-controlled on amlodipine and losartan. BP goal <130/80.  Continue to stay active and encourage her to get at least 150 minutes of exercise.

## 2022-02-22 NOTE — Patient Instructions (Signed)
Medication Instructions:  Your physician recommends that you continue on your current medications as directed. Please refer to the Current Medication list given to you today.   *If you need a refill on your cardiac medications before your next appointment, please call your pharmacy*  Lab Work: NONE  Testing/Procedures: Your physician has requested that you have an echocardiogram. Echocardiography is a painless test that uses sound waves to create images of your heart. It provides your doctor with information about the size and shape of your heart and how well your heart's chambers and valves are working. This procedure takes approximately one hour. There are no restrictions for this procedure.  CALCIUM SCORE - THIS WILL COST YOU $99 OUT OF POCKET   Follow-Up: At [facility], you and your health needs are our priority.  As part of our continuing mission to provide you with exceptional heart care, we have created designated Provider Care Teams.  These Care Teams include your primary Cardiologist (physician) and Advanced Practice Providers (APPs -  Physician Assistants and Nurse Practitioners) who all work together to provide you with the care you need, when you need it.  We recommend signing up for the patient portal called "MyChart".  Sign up information is provided on this After Visit Summary.  MyChart is used to connect with patients for Virtual Visits (Telemedicine).  Patients are able to view lab/test results, encounter notes, upcoming appointments, etc.  Non-urgent messages can be sent to your provider as well.   To learn more about what you can do with MyChart, go to [email or web address].    Your next appointment:   2 month(s)  The format for your next appointment:   In Person  Provider:   Laurann Montana, NP

## 2022-02-22 NOTE — Assessment & Plan Note (Signed)
She reports being told that her heart muscle was thick in the past.  She does not have any heart failure symptoms such as swelling or orthopnea or shortness of breath.  We will get an echo to get a new baseline.  There is no evidence of any significant LVH on her EKG.

## 2022-02-22 NOTE — Assessment & Plan Note (Signed)
Several family members have coronary disease.  She does not have any ischemic symptoms.  She is very active but does not get much exercise.  We will get a calcium score to better understand her risk.  For now lipids are well controlled with diet and exercise.

## 2022-02-22 NOTE — Progress Notes (Signed)
Cardiology Office Note:    Date:  02/23/2022   ID:  Victoria Hudson, DOB September 11, 1945, MRN [id number]  PCP:  Collene Leyden, MD   [place] Providers Cardiologist:  None    Referring MD: Mckinley Jewel, MD    History of Present Illness:    Victoria Hudson is a 76 y.o. female hypertension, hypothyroidism, GERD and prior breast cancer here to discuss family history of heart disease. She saw her PCP on 01/2022 and was feeling well. She anted to be seen by cardiology because her subling and mom have heart disease. She has been taking metroplol for palpitations and is stable.  She has a strong family history of heart disease. Her father passed away from a leaking heart valve, along with a pace maker. Her mother had surgery when she was 71 which resulted in a stroke that paralyzed her. She passed of a heart attack. Her sister had  procedure done and then had to be treated for a massive infection 6 years other. She also had COPD from smoking. Her brother also had three heart attacks but he died at 72 from leukemia. Her other sister has to have a heart ablation before a knee surgery. Her daughter also had to have an ablation done. She is active by taking care of her husband. He has had a stroke and she is his caretaker. She chooses to go out herself for groceries and errands to help her keep more active. She is on a cancer pill that causes her nausea. She is also on Nexium and that also causes nausea and the effects can last for a long time before it will wear off. She has been noticing more nausea in the last few weeks. She has numbness in her lips and the area superior to the lips from the medication. She also has GI upset and some diarrhea due to the medication. She denies any chest pains or palpitations. About 6-10 years ago when she was at [facility] she had a echocardiogram done and was told that her heart was showing some thickening of the heart muscle.  Since starting the  amlodipine she does get LE swelling depending on how active she is and if she eats a lot of salt. It will resolve by morning once she goes to sleep. Her blood pressure was 130/68 in office today. She has not been monitoring it closely lately but this is typical of what she sees when she does. She has back pain and also suffers from neck pain. She reports that the neck pain is worse when she turns to the left. 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 She got injections recently to help with the pain but if she misses her muscle relaxant her hands will close up and be difficult to open. She denies any tobacco or alcohol use. For exercise she is able to walk to her mailbox without difficulty and also does stretching exercises to stay active. She reported that she has two falls this year. Additionally she was recently diagnosed as diabetic. She denies any palpitations, chest pain, shortness of breath, lightheadedness, headaches, syncope, orthopnea, or PND.    Past Medical History:  Diagnosis Date   Arthritis    Asthma    " allergies"   Chronic back pain    Complication of anesthesia 1968   "fought the anesthesia" problems with memory 05/06/15 at [facility]"   Depression  Family history of premature CAD 02/22/2022   Fibromyalgia    GERD (gastroesophageal reflux disease)    History of hiatal hernia    Hypertension    Hypothyroidism    LVH (left ventricular hypertrophy) 02/22/2022   PONV (postoperative nausea and vomiting)    Thyroid disease    Torn meniscus    left knee   Vertigo    Wears glasses     Past Surgical History:  Procedure Laterality Date   ABDOMINAL HYSTERECTOMY  1976   ANTERIOR CERVICAL DECOMP/DISCECTOMY FUSION N/A 05/06/2015   Procedure: Cervical four-five, Cervical five-six, Cervical six-seven Anterior cervical decompression/diskectomy/fusion;  Surgeon: Leeroy Cha, MD;  Location: [facility];  Service: Neurosurgery;   Laterality: N/A;  C4-5 C5-6 C6-7 Anterior cervical decompression/diskectomy/fusion   APPENDECTOMY     BACK SURGERY     BREAST LUMPECTOMY WITH RADIOACTIVE SEED LOCALIZATION Right 11/15/2019   Procedure: RIGHT BREAST LUMPECTOMY WITH RADIOACTIVE SEED LOCALIZATION;  Surgeon: Jovita Kussmaul, MD;  Location: [place];  Service: General;  Laterality: Right;   BREAST SURGERY Bilateral    biopsy- 2 different times   CARPAL TUNNEL RELEASE     B/L   CARPAL TUNNEL RELEASE Left 04/15/2020   CHOLECYSTECTOMY     COLONOSCOPY  2014   COLONOSCOPY W/ POLYPECTOMY     dental implants     DILATION AND CURETTAGE OF UTERUS  1968   ELBOW ARTHROSCOPY Right    right   FOOT GANGLION EXCISION Left    left   KNEE ARTHROSCOPY Right    torn ligament   LAMINECTOMY     L 4 and 5; 2 separate surgeries   left hand     carpal tunel   right hand     3 surgeries- carpal tunel    Current Medications: Current Meds  Medication Sig   albuterol (VENTOLIN HFA) 108 (90 Base) MCG/ACT inhaler Inhale 2 puffs into the lungs every 6 (six) hours as needed for wheezing or shortness of breath.   amLODipine (NORVASC) 5 MG tablet TAKE 1 TABLET EVERY DAY   anastrozole (ARIMIDEX) 1 MG tablet Take 1 mg by mouth daily.   Calcium Carb-Cholecalciferol (CALCIUM/VITAMIN D) 600-400 MG-UNIT TABS Take 1 tablet by mouth in the morning and at bedtime.   cyanocobalamin 2000 MCG tablet Take 2,000 mcg by mouth daily.   cyclobenzaprine (FLEXERIL) 10 MG tablet TAKE 1/2 TO 1 TABLET UP TO TWICE DAILY AS NEEDED   esomeprazole (NEXIUM) 40 MG capsule Take 40 mg by mouth daily at 12 noon.   Glucosamine 500 MG CAPS Take by mouth 2 (two) times daily.   levothyroxine (SYNTHROID) 50 MCG tablet TAKE 1 TABLET EVERY MORNING   losartan (COZAAR) 100 MG tablet Take 1 tablet (100 mg total) by mouth daily.   metFORMIN (GLUCOPHAGE-XR) 500 MG 24 hr tablet Take 500 mg by mouth daily.   metoprolol succinate (TOPROL-XL) 50 MG 24 hr tablet Take 1 tablet  (50 mg total) by mouth daily. Take with or immediately following a meal.   mometasone (NASONEX) 50 MCG/ACT nasal spray Place 2 sprays into the nose daily as needed (allergies).   omeprazole (PRILOSEC) 40 MG capsule Take 40 mg by mouth daily.   ondansetron (ZOFRAN) 8 MG tablet Take by mouth every 8 (eight) hours as needed for nausea or vomiting.   [DISCONTINUED] estazolam (PROSOM) 2 MG tablet Take 1 tablet by mouth as needed at bedtime for muscle pain and insomnia   [DISCONTINUED] meclizine (ANTIVERT) 25 MG tablet Take 1  tablet (25 mg total) by mouth 3 (three) times daily as needed for dizziness.     Allergies:   Beta adrenergic blockers, Mobic [meloxicam], Sulfa antibiotics, Tape, Symbicort [budesonide-formoterol fumarate], Aspirin, and Oxycodone   Social History   Socioeconomic History   Marital status: Married    Spouse name: Not on file   Number of children: Not on file   Years of education: Not on file   Highest education level: Not on file  Occupational History   Not on file  Tobacco Use   Smoking status: Never   Smokeless tobacco: Never  Vaping Use   Vaping Use: Never used  Substance and Sexual Activity   Alcohol use: No   Drug use: No   Sexual activity: Not on file  Other Topics Concern   Not on file  Social History Narrative   Social History      Diet? none      Do you drink/eat things with caffeine? No       Marital status?    married                                What year were you married? 1961      Do you live in a house, apartment, assisted living, condo, trailer, etc.? home      Is it one or more stories? 1 level       How many persons live in your home? 2       Do you have any pets in your home? (please list) none      Highest level of education completed? Completed college 4 years      Current or past profession: social work activist      Do you exercise?        yes                              Type & how often? Aerobic 4 to 5 times a weeks       Advanced Directives      Do you have a living will? yes      Do you have a DNR form?                                  If not, do you want to discuss one?no       Do you have signed POA/HPOA for forms? yes      Functional Status      Do you have difficulty bathing or dressing yourself? no      Do you have difficulty preparing food or eating? No       Do you have difficulty managing your medications? No       Do you have difficulty managing your finances? No       Do you have difficulty affording your medications? No       Social Determinants of Health   Financial Resource Strain: Low Risk  (02/22/2022)   Overall Financial Resource Strain (CARDIA)    Difficulty of Paying Living Expenses: Not hard at all  Food Insecurity: Not on file  Transportation Needs: No Transportation Needs (02/22/2022)   PRAPARE - Hydrologist (Medical): No    Lack of Transportation (Non-Medical): No  Physical Activity: Insufficiently  Active (02/22/2022)   Exercise Vital Sign    Days of Exercise per Week: 3 days    Minutes of Exercise per Session: 30 min  Stress: Not on file  Social Connections: Not on file     Family History: The patient's family history includes Breast cancer in her sister; Cancer in her mother and sister; Diabetes in her daughter and daughter; Fibromyalgia in her daughter; HIV/AIDS in her sister; Heart attack in her brother, father, and mother; Heart disease in her father; Hypertension in her father and mother; Leukemia in her brother and brother; Lupus in her father, mother, and sister; Stroke in her mother; Valvular heart disease in her father. There is no history of Colon cancer.  ROS:   Please see the history of present illness.    (+) back pain (+) neck pain (+) arthralgias (+) nausea (+) diarrhea  (+) lip numbness  All other systems reviewed and are negative.  EKGs/Labs/Other Studies Reviewed:    The following studies were reviewed today: No  prior cardiovascular studies available.   EKG:  EKG is personally reviewed.  02/22/2022: Sinus rhythm 70 bpm.   Recent Labs: No results found for requested labs within last 365 days.  Recent Lipid Panel    Component Value Date/Time   CHOL 169 02/01/2021 1202   TRIG 142 02/01/2021 1202   HDL 61 02/01/2021 1202   CHOLHDL 2.8 02/01/2021 1202   LDLCALC 84 02/01/2021 1202     Risk Assessment/Calculations:      Physical Exam:    VS:  BP 130/68 (BP Location: Left Arm, Patient Position: Sitting, Cuff Size: Large)   Pulse 70   Ht '5\' 1"'$  (1.549 m)   Wt 168 lb 9.6 oz (76.5 kg)   BMI 31.86 kg/m     Wt Readings from Last 3 Encounters:  02/22/22 168 lb 9.6 oz (76.5 kg)  02/15/21 180 lb (81.6 kg)  02/09/21 179 lb (81.2 kg)     VS:  BP 130/68 (BP Location: Left Arm, Patient Position: Sitting, Cuff Size: Large)   Pulse 70   Ht '5\' 1"'$  (1.549 m)   Wt 168 lb 9.6 oz (76.5 kg)   BMI 31.86 kg/m  , BMI Body mass index is 31.86 kg/m. GENERAL:  Well appearing HEENT: Pupils equal round and reactive, fundi not visualized, oral mucosa unremarkable NECK:  No jugular venous distention, waveform within normal limits, carotid upstroke brisk and symmetric, no bruits, no thyromegaly LUNGS:  Clear to auscultation bilaterally HEART:  RRR.  PMI not displaced or sustained,S1 and S2 within normal limits, no S3, no S4, no clicks, no rubs, no murmurs ABD:  Flat, positive bowel sounds normal in frequency in pitch, no bruits, no rebound, no guarding, no midline pulsatile mass, no hepatomegaly, no splenomegaly EXT:  2 plus pulses throughout, no edema, no cyanosis no clubbing SKIN:  No rashes no nodules NEURO:  Cranial nerves II through XII grossly intact, motor grossly intact throughout PSYCH:  Cognitively intact, oriented to person place and time   ASSESSMENT:    1. Essential hypertension   2. Family history of premature CAD   3. LVH (left ventricular hypertrophy)    PLAN:    Essential  hypertension Blood pressure well-controlled on amlodipine and losartan. BP goal <130/80.  Continue to stay active and encourage her to get at least 150 minutes of exercise.    Family history of premature CAD Several family members have coronary disease.  She does not have any ischemic symptoms.  She  is very active but does not get much exercise.  We will get a calcium score to better understand her risk.  For now lipids are well controlled with diet and exercise.  LVH (left ventricular hypertrophy) She reports being told that her heart muscle was thick in the past.  She does not have any heart failure symptoms such as swelling or orthopnea or shortness of breath.  We will get an echo to get a new baseline.  There is no evidence of any significant LVH on her EKG.   Disposition:  Follow up with APP in 2 months.   Medication Adjustments/Labs and Tests Ordered: Current medicines are reviewed at length with the patient today.  Concerns regarding medicines are outlined above.  Orders Placed This Encounter  Procedures   CT CARDIAC SCORING (SELF PAY ONLY)   EKG 12-Lead   ECHOCARDIOGRAM COMPLETE   No orders of the defined types were placed in this encounter.   Patient Instructions  Medication Instructions:  Your physician recommends that you continue on your current medications as directed. Please refer to the Current Medication list given to you today.   *If you need a refill on your cardiac medications before your next appointment, please call your pharmacy*  Lab Work: NONE  Testing/Procedures: Your physician has requested that you have an echocardiogram. Echocardiography is a painless test that uses sound waves to create images of your heart. It provides your doctor with information about the size and shape of your heart and how well your heart's chambers and valves are working. This procedure takes approximately one hour. There are no restrictions for this procedure.  CALCIUM SCORE -  THIS WILL COST YOU $99 OUT OF POCKET   Follow-Up: At [facility], you and your health needs are our priority.  As part of our continuing mission to provide you with exceptional heart care, we have created designated Provider Care Teams.  These Care Teams include your primary Cardiologist (physician) and Advanced Practice Providers (APPs -  Physician Assistants and Nurse Practitioners) who all work together to provide you with the care you need, when you need it.  We recommend signing up for the patient portal called "MyChart".  Sign up information is provided on this After Visit Summary.  MyChart is used to connect with patients for Virtual Visits (Telemedicine).  Patients are able to view lab/test results, encounter notes, upcoming appointments, etc.  Non-urgent messages can be sent to your provider as well.   To learn more about what you can do with MyChart, go to [email or web address].    Your next appointment:   2 month(s)  The format for your next appointment:   In Person  Provider:   Laurann Montana, NP         Burnett Kanaris Ford,acting as a scribe for Skeet Latch, MD.,have documented all relevant documentation on the behalf of Skeet Latch, MD,as directed by  Skeet Latch, MD while in the presence of Skeet Latch, MD.   I, Carson Oval Linsey, MD have reviewed all documentation for this visit.  The documentation of the exam, diagnosis, procedures, and orders on 02/23/2022 are all accurate and complete.   Signed, Skeet Latch, MD  02/23/2022 9:03 PM    [place]

## 2022-02-23 ENCOUNTER — Encounter (HOSPITAL_BASED_OUTPATIENT_CLINIC_OR_DEPARTMENT_OTHER): Payer: Self-pay | Admitting: Cardiovascular Disease

## 2022-03-04 DIAGNOSIS — G4700 Insomnia, unspecified: Secondary | ICD-10-CM | POA: Diagnosis not present

## 2022-03-04 DIAGNOSIS — E1165 Type 2 diabetes mellitus with hyperglycemia: Secondary | ICD-10-CM | POA: Diagnosis not present

## 2022-03-08 ENCOUNTER — Ambulatory Visit (INDEPENDENT_AMBULATORY_CARE_PROVIDER_SITE_OTHER): Payer: Medicare Other

## 2022-03-08 DIAGNOSIS — I10 Essential (primary) hypertension: Secondary | ICD-10-CM | POA: Diagnosis not present

## 2022-03-08 DIAGNOSIS — Z8249 Family history of ischemic heart disease and other diseases of the circulatory system: Secondary | ICD-10-CM | POA: Diagnosis not present

## 2022-03-08 DIAGNOSIS — I517 Cardiomegaly: Secondary | ICD-10-CM

## 2022-03-08 LAB — ECHOCARDIOGRAM COMPLETE
Area-P 1/2: 4.68 cm2
S' Lateral: 1.89 cm

## 2022-03-09 ENCOUNTER — Ambulatory Visit: Payer: Medicare Other | Admitting: Registered"

## 2022-03-09 ENCOUNTER — Telehealth (HOSPITAL_BASED_OUTPATIENT_CLINIC_OR_DEPARTMENT_OTHER): Payer: Self-pay | Admitting: *Deleted

## 2022-03-09 NOTE — Telephone Encounter (Signed)
-----   Message from Skeet Latch, MD sent at 03/09/2022  4:20 AM EDT ----- Mild thickening of the mitral valve but otherwise normal echo.

## 2022-03-09 NOTE — Telephone Encounter (Signed)
Left message to call back  

## 2022-03-10 ENCOUNTER — Ambulatory Visit
Admission: RE | Admit: 2022-03-10 | Discharge: 2022-03-10 | Disposition: A | Discharge status: Home / Self Care | Payer: Medicare Other | Source: Ambulatory Visit | Attending: Neurological Surgery | Admitting: Neurological Surgery

## 2022-03-10 DIAGNOSIS — M47816 Spondylosis without myelopathy or radiculopathy, lumbar region: Secondary | ICD-10-CM

## 2022-03-10 DIAGNOSIS — M542 Cervicalgia: Secondary | ICD-10-CM

## 2022-03-10 DIAGNOSIS — M48061 Spinal stenosis, lumbar region without neurogenic claudication: Secondary | ICD-10-CM | POA: Diagnosis not present

## 2022-03-15 NOTE — Telephone Encounter (Signed)
Advised patient of results.  

## 2022-03-15 NOTE — Telephone Encounter (Signed)
Patient is returning call. Please advise? 

## 2022-03-23 DIAGNOSIS — M542 Cervicalgia: Secondary | ICD-10-CM | POA: Diagnosis not present

## 2022-03-23 DIAGNOSIS — M4726 Other spondylosis with radiculopathy, lumbar region: Secondary | ICD-10-CM | POA: Diagnosis not present

## 2022-03-23 DIAGNOSIS — S129XXA Fracture of neck, unspecified, initial encounter: Secondary | ICD-10-CM | POA: Diagnosis not present

## 2022-03-30 ENCOUNTER — Ambulatory Visit (HOSPITAL_BASED_OUTPATIENT_CLINIC_OR_DEPARTMENT_OTHER)
Admission: RE | Admit: 2022-03-30 | Discharge: 2022-03-30 | Disposition: A | Discharge status: Home / Self Care | Payer: Medicare Other | Source: Ambulatory Visit | Attending: Cardiovascular Disease | Admitting: Cardiovascular Disease

## 2022-03-30 DIAGNOSIS — Z8249 Family history of ischemic heart disease and other diseases of the circulatory system: Secondary | ICD-10-CM | POA: Insufficient documentation

## 2022-03-30 DIAGNOSIS — I10 Essential (primary) hypertension: Secondary | ICD-10-CM | POA: Insufficient documentation

## 2022-04-01 DIAGNOSIS — G4700 Insomnia, unspecified: Secondary | ICD-10-CM | POA: Diagnosis not present

## 2022-04-01 DIAGNOSIS — E1165 Type 2 diabetes mellitus with hyperglycemia: Secondary | ICD-10-CM | POA: Diagnosis not present

## 2022-04-05 DIAGNOSIS — M5416 Radiculopathy, lumbar region: Secondary | ICD-10-CM | POA: Diagnosis not present

## 2022-04-24 NOTE — Progress Notes (Unsigned)
Office Visit    Patient Name: Victoria Hudson Date of Encounter: 04/25/2022  PCP:  Collene Leyden, [place]  Cardiologist:  Skeet Latch, MD  Advanced Practice Provider:  No care team member to display Electrophysiologist:  None     Chief Complaint    [place] is a 76 y.o. female presents today for follow up after cardiac testing.  Past Medical History    Past Medical History:  Diagnosis Date   Arthritis    Asthma    " allergies"   Chronic back pain    Complication of anesthesia 1968   "fought the anesthesia" problems with memory 05/06/15 at [facility]"   Depression    Family history of premature CAD 02/22/2022   Fibromyalgia    GERD (gastroesophageal reflux disease)    History of hiatal hernia    Hypertension    Hypothyroidism    LVH (left ventricular hypertrophy) 02/22/2022   PONV (postoperative nausea and vomiting)    Thyroid disease    Torn meniscus    left knee   Vertigo    Wears glasses    Past Surgical History:  Procedure Laterality Date   ABDOMINAL HYSTERECTOMY  1976   ANTERIOR CERVICAL DECOMP/DISCECTOMY FUSION N/A 05/06/2015   Procedure: Cervical four-five, Cervical five-six, Cervical six-seven Anterior cervical decompression/diskectomy/fusion;  Surgeon: Leeroy Cha, MD;  Location: [facility];  Service: Neurosurgery;  Laterality: N/A;  C4-5 C5-6 C6-7 Anterior cervical decompression/diskectomy/fusion   APPENDECTOMY     BACK SURGERY     BREAST LUMPECTOMY WITH RADIOACTIVE SEED LOCALIZATION Right 11/15/2019   Procedure: RIGHT BREAST LUMPECTOMY WITH RADIOACTIVE SEED LOCALIZATION;  Surgeon: Jovita Kussmaul, MD;  Location: [place];  Service: General;  Laterality: Right;   BREAST SURGERY Bilateral    biopsy- 2 different times   CARPAL TUNNEL RELEASE     B/L   CARPAL TUNNEL RELEASE Left 04/15/2020   CHOLECYSTECTOMY     COLONOSCOPY  2014   COLONOSCOPY W/ POLYPECTOMY     dental implants      DILATION AND CURETTAGE OF UTERUS  1968   ELBOW ARTHROSCOPY Right    right   FOOT GANGLION EXCISION Left    left   KNEE ARTHROSCOPY Right    torn ligament   LAMINECTOMY     L 4 and 5; 2 separate surgeries   left hand     carpal tunel   right hand     3 surgeries- carpal tunel   Allergies  Allergies  Allergen Reactions   Beta Adrenergic Blockers Other (See Comments)    Certain beta blockers make me hoarse.   Mobic [Meloxicam] Hypertension    Made blood pressure extremely high   Sulfa Antibiotics Itching and Swelling   Tape Rash    Paper tape   Symbicort [Budesonide-Formoterol Fumarate] Cough    Coughing up blood    Aspirin Other (See Comments)    Stomach burning; increased acid   Oxycodone Other (See Comments)    It makes her someone she's not     History of Present Illness    Victoria Hudson is a 76 y.o. female with a hx of hypertension, coronary artery disease, hypothyroidism, GERD, prior breast cancer last seen 02/22/22.   Initial evaluation to discuss family history of heart disease. Father passed away from leaking heart valve and PPM. Mother had surgery at 60 leading to stroke which parlayzed her and passed of a heart attack. Siblings  also with heart disease. Daughter had ablation. 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 Noted previous echo with her PCP which reported LVH.   Subsequent workup included echo 03/08/22 normal LVEF 60-65%, no RWMA, mild thickening of mitral valve with no regurgitation nor stenosis. Cardiac CT with aortic atherosclerosis and coronary calcium score of 18.8 placing her in the 30th percentile for age/race/sex matched controls.   She presents today for follow up. Active in her church and with food pantry. She does note recent back and neck difficulties which has led to some hand numbness. Has upcoming follow up with orthopedics. Reports no shortness of breath nor dyspnea on exertion. Reports no chest pain,  pressure, or tightness. No edema, orthopnea, PND. Reports no palpitations.    EKGs/Labs/Other Studies Reviewed:   The following studies were reviewed today:  Echo 03/08/22  1. Left ventricular ejection fraction, by estimation, is 60 to 65%. The  left ventricle has normal function. The left ventricle has no regional  wall motion abnormalities. Left ventricular diastolic parameters were  normal. The average left ventricular  global longitudinal strain is -15.4 %. The global longitudinal strain is  normal.   2. Right ventricular systolic function is normal. The right ventricular  size is normal.   3. Left atrial size was mildly dilated.   4. The mitral valve is abnormal. No evidence of mitral valve  regurgitation. No evidence of mitral stenosis.   5. The aortic valve is tricuspid. Aortic valve regurgitation is not  visualized. No aortic stenosis is present.   6. The inferior vena cava is normal in size with greater than 50%  respiratory variability, suggesting right atrial pressure of 3 mmHg.  Cardiac CT 03/30/22 FINDINGS: Coronary arteries: Normal origins.   Coronary Calcium Score:   Left main: 0   Left anterior descending artery: 1.37   Left circumflex artery: 17.4   Right coronary artery: 0   Total: 18.8   Percentile: 30th   Pericardium: Normal.   Ascending Aorta: Normal caliber.  Aortic atherosclerosis.   Non-cardiac: See separate report from [facility].   IMPRESSION: Coronary calcium score of 18.8. This was 30th percentile for age-, race-, and sex-matched controls.   Aortic atherosclerosis.  EKG:  EKG is not ordered today.    Recent Labs: No results found for requested labs within last 365 days.  Recent Lipid Panel    Component Value Date/Time   CHOL 169 02/01/2021 1202   TRIG 142 02/01/2021 1202   HDL 61 02/01/2021 1202   CHOLHDL 2.8 02/01/2021 1202   LDLCALC 84 02/01/2021 1202   Home Medications   Current Meds  Medication Sig    albuterol (VENTOLIN HFA) 108 (90 Base) MCG/ACT inhaler Inhale 2 puffs into the lungs every 6 (six) hours as needed for wheezing or shortness of breath.   amLODipine (NORVASC) 5 MG tablet TAKE 1 TABLET EVERY DAY   anastrozole (ARIMIDEX) 1 MG tablet Take 1 mg by mouth daily.   Calcium Carb-Cholecalciferol (CALCIUM/VITAMIN D) 600-400 MG-UNIT TABS Take 1 tablet by mouth in the morning and at bedtime.   cyanocobalamin 2000 MCG tablet Take 2,000 mcg by mouth daily.   cyclobenzaprine (FLEXERIL) 10 MG tablet TAKE 1/2 TO 1 TABLET UP TO TWICE DAILY AS NEEDED   esomeprazole (NEXIUM) 40 MG capsule Take 40 mg by mouth daily at 12 noon.   Glucosamine 500 MG CAPS Take by mouth 2 (two) times daily.   levothyroxine (SYNTHROID) 50 MCG tablet TAKE 1  TABLET EVERY MORNING   losartan (COZAAR) 100 MG tablet Take 1 tablet (100 mg total) by mouth daily.   metoprolol succinate (TOPROL-XL) 50 MG 24 hr tablet Take 1 tablet (50 mg total) by mouth daily. Take with or immediately following a meal.   mometasone (NASONEX) 50 MCG/ACT nasal spray Place 2 sprays into the nose daily as needed (allergies).   omeprazole (PRILOSEC) 40 MG capsule Take 40 mg by mouth daily.   ondansetron (ZOFRAN) 8 MG tablet Take by mouth every 8 (eight) hours as needed for nausea or vomiting.   zolpidem (AMBIEN) 5 MG tablet Take 5 mg by mouth at bedtime as needed.     Review of Systems      All other systems reviewed and are otherwise negative except as noted above.  Physical Exam    VS:  BP 113/67 (BP Location: Left Arm, Patient Position: Sitting, Cuff Size: Large)   Pulse 75   Ht '5\' 1"'$  (1.549 m)   Wt 170 lb 1.6 oz (77.2 kg)   BMI 32.14 kg/m  , BMI Body mass index is 32.14 kg/m.  Wt Readings from Last 3 Encounters:  04/25/22 170 lb 1.6 oz (77.2 kg)  02/22/22 168 lb 9.6 oz (76.5 kg)  02/15/21 180 lb (81.6 kg)    GEN: Well nourished, overweight, well developed, in no acute distress. HEENT: normal. Neck: Supple, no JVD, carotid bruits,  or masses. Cardiac: RRR, no murmurs, rubs, or gallops. No clubbing, cyanosis, edema.  Radials/PT 2+ and equal bilaterally.  Respiratory:  Respirations regular and unlabored, clear to auscultation bilaterally. GI: Soft, nontender, nondistended. MS: No deformity or atrophy. Skin: Warm and dry, no rash. Neuro:  Strength and sensation are intact. Psych: Normal affect.  Assessment & Plan    HTN - BP well controlled. Continue current antihypertensive regimen.    CAD / Aortic atherosclerosis / HLD, LDL goal <70 - 03/30/22 with coronary calcium score of 18.8. Stable with no anginal symptoms. No indication for ischemic evaluation.  For GMDT, start Aspirin EC '81mg'$  daily and Rosuvastatin '5mg'$  daily. FLP/LFT in 2 months.   DM2 - Continue to follow with PCP.        Disposition: Follow up in 6 month(s) with Skeet Latch, MD or APP.  Signed, Victoria Dubonnet, NP 04/25/2022, 2:00 PM [place]

## 2022-04-25 ENCOUNTER — Ambulatory Visit (HOSPITAL_BASED_OUTPATIENT_CLINIC_OR_DEPARTMENT_OTHER): Payer: Medicare Other | Admitting: Family

## 2022-04-25 ENCOUNTER — Encounter (HOSPITAL_BASED_OUTPATIENT_CLINIC_OR_DEPARTMENT_OTHER): Payer: Self-pay | Admitting: Family

## 2022-04-25 VITALS — BP 113/67 | HR 75 | Ht 61.0 in | Wt 170.1 lb

## 2022-04-25 DIAGNOSIS — I25118 Atherosclerotic heart disease of native coronary artery with other forms of angina pectoris: Secondary | ICD-10-CM | POA: Diagnosis not present

## 2022-04-25 DIAGNOSIS — E785 Hyperlipidemia, unspecified: Secondary | ICD-10-CM | POA: Diagnosis not present

## 2022-04-25 DIAGNOSIS — I10 Essential (primary) hypertension: Secondary | ICD-10-CM | POA: Diagnosis not present

## 2022-04-25 MED ORDER — ROSUVASTATIN CALCIUM 5 MG PO TABS: 5.0000 mg | ORAL_TABLET | Freq: Every day | ORAL | 3 refills | Status: DC

## 2022-04-25 MED ORDER — ASPIRIN 81 MG PO TBEC: 81.0000 mg | DELAYED_RELEASE_TABLET | Freq: Every day | ORAL | 3 refills | Status: AC

## 2022-04-25 NOTE — Patient Instructions (Addendum)
Medication Instructions:  ASA 81 mg daily Rosuvastatin 5 mg daily  *If you need a refill on your cardiac medications before your next appointment, please call your pharmacy*   Lab Work: Fasting Lipids 51mo Liver function studies 2 mos  If you have labs (blood work) drawn today and your tests are completely normal, you will receive your results only by: [place](if you have MyChart) OR A paper copy in the mail If you have any lab test that is abnormal or we need to change your treatment, we will call you to review the results.   Testing/Procedures: None   Follow-Up: [facility] our [facility].  As part of our continuing mission to provide you with exceptional heart care, we have created designated Provider Care Teams.  These Care Teams include your primary Cardiologist (physician) and Advanced Practice Providers (APPs -  Physician Assistants and Nurse Practitioners) who all work together to provide you with the care you need, when you need it.  We recommend signing up for the patient portal called "MyChart".  Sign up information is provided on this After Visit Summary.  MyChart is used to connect with patients for Virtual Visits (Telemedicine).  Patients are able to view lab/test results, encounter notes, upcoming appointments, etc.  Non-urgent messages can be sent to your provider as well.   To learn more about what you can do with MyChart, go to [email or web address]    Your next appointment:   6 month(s)  The format for your next appointment:   In Person  Provider:   Dr. TSkeet Latchor CLaurann MontanaNP   Other Instructions  Your echocardiogram showed normal heart muscle function and no thickening of the heart muscle which is great! Your CT scan showed a small amount of plaque in your heart which is common for those in your age group. This is called coronary artery disease.  For coronary artery disease often called  "heart disease" we aim for optimal guideline directed medical therapy. We use the "A, B, C"s to help keep [place] track!  A = Aspirin '81mg'$  daily B = Blood pressure control.  C = Cholesterol control. You take Rosuvastatin to help control your cholesterol.   Heart Healthy Diet Recommendations: A low-salt diet is recommended. Meats should be grilled, baked, or boiled. Avoid fried foods. Focus on lean protein sources like fish or chicken with vegetables and fruits. The American Heart Association is a GMicrobiologist  American Heart Association Diet and Lifeystyle Recommendations    Exercise recommendations: The American Heart Association recommends 150 minutes of moderate intensity exercise weekly. Try 30 minutes of moderate intensity exercise 4-5 times per week. This could include walking, jogging, or swimming.

## 2022-04-26 ENCOUNTER — Encounter (HOSPITAL_BASED_OUTPATIENT_CLINIC_OR_DEPARTMENT_OTHER): Payer: Self-pay | Admitting: Family

## 2022-04-27 DIAGNOSIS — M5416 Radiculopathy, lumbar region: Secondary | ICD-10-CM | POA: Diagnosis not present

## 2022-04-27 DIAGNOSIS — Z6832 Body mass index (BMI) 32.0-32.9, adult: Secondary | ICD-10-CM | POA: Diagnosis not present

## 2022-04-27 DIAGNOSIS — M542 Cervicalgia: Secondary | ICD-10-CM | POA: Diagnosis not present

## 2022-05-04 DIAGNOSIS — Z23 Encounter for immunization: Secondary | ICD-10-CM | POA: Diagnosis not present

## 2022-05-12 DIAGNOSIS — M5416 Radiculopathy, lumbar region: Secondary | ICD-10-CM | POA: Diagnosis not present

## 2022-06-16 DIAGNOSIS — R059 Cough, unspecified: Secondary | ICD-10-CM | POA: Diagnosis not present

## 2022-06-16 DIAGNOSIS — J329 Chronic sinusitis, unspecified: Secondary | ICD-10-CM | POA: Diagnosis not present

## 2022-06-16 DIAGNOSIS — R0981 Nasal congestion: Secondary | ICD-10-CM | POA: Diagnosis not present

## 2022-06-16 DIAGNOSIS — Z8709 Personal history of other diseases of the respiratory system: Secondary | ICD-10-CM | POA: Diagnosis not present

## 2022-06-16 DIAGNOSIS — J40 Bronchitis, not specified as acute or chronic: Secondary | ICD-10-CM | POA: Diagnosis not present

## 2022-06-22 DIAGNOSIS — E039 Hypothyroidism, unspecified: Secondary | ICD-10-CM | POA: Diagnosis not present

## 2022-06-22 DIAGNOSIS — I2510 Atherosclerotic heart disease of native coronary artery without angina pectoris: Secondary | ICD-10-CM | POA: Diagnosis not present

## 2022-06-22 DIAGNOSIS — I10 Essential (primary) hypertension: Secondary | ICD-10-CM | POA: Diagnosis not present

## 2022-06-22 DIAGNOSIS — E1165 Type 2 diabetes mellitus with hyperglycemia: Secondary | ICD-10-CM | POA: Diagnosis not present

## 2022-06-22 DIAGNOSIS — G4700 Insomnia, unspecified: Secondary | ICD-10-CM | POA: Diagnosis not present

## 2022-06-22 DIAGNOSIS — M797 Fibromyalgia: Secondary | ICD-10-CM | POA: Diagnosis not present

## 2022-07-21 DIAGNOSIS — L309 Dermatitis, unspecified: Secondary | ICD-10-CM | POA: Diagnosis not present

## 2022-09-07 DIAGNOSIS — I25118 Atherosclerotic heart disease of native coronary artery with other forms of angina pectoris: Secondary | ICD-10-CM | POA: Diagnosis not present

## 2022-09-07 DIAGNOSIS — K219 Gastro-esophageal reflux disease without esophagitis: Secondary | ICD-10-CM | POA: Diagnosis not present

## 2022-09-07 DIAGNOSIS — J309 Allergic rhinitis, unspecified: Secondary | ICD-10-CM | POA: Diagnosis not present

## 2022-09-07 DIAGNOSIS — Z13820 Encounter for screening for osteoporosis: Secondary | ICD-10-CM | POA: Diagnosis not present

## 2022-09-07 DIAGNOSIS — Z0000 Encounter for general adult medical examination without abnormal findings: Secondary | ICD-10-CM | POA: Diagnosis not present

## 2022-09-07 DIAGNOSIS — C50919 Malignant neoplasm of unspecified site of unspecified female breast: Secondary | ICD-10-CM | POA: Diagnosis not present

## 2022-09-07 DIAGNOSIS — J45909 Unspecified asthma, uncomplicated: Secondary | ICD-10-CM | POA: Diagnosis not present

## 2022-09-07 DIAGNOSIS — E039 Hypothyroidism, unspecified: Secondary | ICD-10-CM | POA: Diagnosis not present

## 2022-09-07 DIAGNOSIS — E11620 Type 2 diabetes mellitus with diabetic dermatitis: Secondary | ICD-10-CM | POA: Diagnosis not present

## 2022-09-07 DIAGNOSIS — H93A1 Pulsatile tinnitus, right ear: Secondary | ICD-10-CM | POA: Diagnosis not present

## 2022-09-07 DIAGNOSIS — E1165 Type 2 diabetes mellitus with hyperglycemia: Secondary | ICD-10-CM | POA: Diagnosis not present

## 2022-09-07 DIAGNOSIS — I10 Essential (primary) hypertension: Secondary | ICD-10-CM | POA: Diagnosis not present

## 2022-09-13 DIAGNOSIS — I6521 Occlusion and stenosis of right carotid artery: Secondary | ICD-10-CM | POA: Diagnosis not present

## 2022-09-13 DIAGNOSIS — H93A1 Pulsatile tinnitus, right ear: Secondary | ICD-10-CM | POA: Diagnosis not present

## 2022-09-13 DIAGNOSIS — M4322 Fusion of spine, cervical region: Secondary | ICD-10-CM | POA: Diagnosis not present

## 2022-09-13 DIAGNOSIS — E0789 Other specified disorders of thyroid: Secondary | ICD-10-CM | POA: Diagnosis not present

## 2022-09-17 DIAGNOSIS — B349 Viral infection, unspecified: Secondary | ICD-10-CM | POA: Diagnosis not present

## 2022-09-17 DIAGNOSIS — M26609 Unspecified temporomandibular joint disorder, unspecified side: Secondary | ICD-10-CM | POA: Diagnosis not present

## 2022-09-28 DIAGNOSIS — I10 Essential (primary) hypertension: Secondary | ICD-10-CM | POA: Diagnosis not present

## 2022-09-28 DIAGNOSIS — E039 Hypothyroidism, unspecified: Secondary | ICD-10-CM | POA: Diagnosis not present

## 2022-09-28 DIAGNOSIS — E049 Nontoxic goiter, unspecified: Secondary | ICD-10-CM | POA: Diagnosis not present

## 2022-09-28 DIAGNOSIS — R296 Repeated falls: Secondary | ICD-10-CM | POA: Diagnosis not present

## 2022-09-30 DIAGNOSIS — H5203 Hypermetropia, bilateral: Secondary | ICD-10-CM | POA: Diagnosis not present

## 2022-10-03 DIAGNOSIS — E039 Hypothyroidism, unspecified: Secondary | ICD-10-CM | POA: Diagnosis not present

## 2022-10-03 DIAGNOSIS — E041 Nontoxic single thyroid nodule: Secondary | ICD-10-CM | POA: Diagnosis not present

## 2022-10-07 ENCOUNTER — Other Ambulatory Visit: Payer: Self-pay | Admitting: Family Medicine

## 2022-10-07 DIAGNOSIS — E041 Nontoxic single thyroid nodule: Secondary | ICD-10-CM

## 2022-10-11 DIAGNOSIS — Z780 Asymptomatic menopausal state: Secondary | ICD-10-CM | POA: Diagnosis not present

## 2022-10-11 DIAGNOSIS — M85831 Other specified disorders of bone density and structure, right forearm: Secondary | ICD-10-CM | POA: Diagnosis not present

## 2022-10-17 DIAGNOSIS — E041 Nontoxic single thyroid nodule: Secondary | ICD-10-CM | POA: Diagnosis not present

## 2022-10-20 ENCOUNTER — Encounter (HOSPITAL_BASED_OUTPATIENT_CLINIC_OR_DEPARTMENT_OTHER): Payer: Self-pay | Admitting: Cardiovascular Disease

## 2022-10-20 ENCOUNTER — Ambulatory Visit (HOSPITAL_BASED_OUTPATIENT_CLINIC_OR_DEPARTMENT_OTHER): Payer: No Typology Code available for payment source | Admitting: Cardiovascular Disease

## 2022-10-20 VITALS — BP 111/64 | HR 75 | Ht 61.0 in | Wt 171.7 lb

## 2022-10-20 DIAGNOSIS — I10 Essential (primary) hypertension: Secondary | ICD-10-CM

## 2022-10-20 DIAGNOSIS — I517 Cardiomegaly: Secondary | ICD-10-CM | POA: Diagnosis not present

## 2022-10-20 DIAGNOSIS — I2510 Atherosclerotic heart disease of native coronary artery without angina pectoris: Secondary | ICD-10-CM

## 2022-10-20 HISTORY — DX: Atherosclerotic heart disease of native coronary artery without angina pectoris: I25.10

## 2022-10-20 NOTE — Progress Notes (Signed)
Cardiology Office Note:    Date:  10/20/2022   ID:  Victoria Hudson, DOB [birth year], MRN [id number]  PCP:  Irven Coe, MD   [facility] Providers Cardiologist:  Chilton Si, MD    Referring MD: Irven Coe, MD    History of Present Illness:    Victoria Hudson is a 78 y.o. female hypertension, hypothyroidism, GERD and prior breast cancer here for follow-up. She was initially seen 02/22/2022 to discuss family history of heart disease. She saw her PCP on 01/2022 and was feeling well. She wanted to be seen by cardiology due to family history of heart disease. She has been taking metroplol for palpitations and is stable.  At her visit 02/2022 blood pressure is well-controlled and she had no ischemic symptoms.  She was active but was not getting exercise.  She had a coronary CTA 03/2022 that revealed a score of 18.8 which was 30th percentile.  She reported a history of LVH.  She had an echo 03/2022 that revealed LVEF 60 to 65% with normal diastolic function and no LVH.  She followed-up with Gillian Shields, NP 04/2022 and was doing well.  She was started on aspirin and low-dose statin.  Today, she is struggling with hoarseness of her voice that has been ongoing for most of this year. 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 Last Thursday while exercising she developed nerve pain in her LUE that she describes as a "thunderstorm in her arm." She mostly performs exercises in the house, but also enjoys working with the flowers in the yard. She has a goal of walking 10,000 steps a day. Last week she was able to achieve 13,000 and 17,000 steps. This past Monday she underwent a thyroid biopsy due to the presence of a nodule. She denies any palpitations, chest pain, shortness of breath, or peripheral edema. No lightheadedness, headaches, syncope, orthopnea, or PND.   Past Medical History:  Diagnosis Date   Arthritis    Asthma    "  allergies"   CAD in native artery 10/20/2022   Chronic back pain    Complication of anesthesia 1968   "fought the anesthesia" problems with memory 05/06/15 at [facility]"   Depression    Family history of premature CAD 02/22/2022   Fibromyalgia    GERD (gastroesophageal reflux disease)    History of hiatal hernia    Hypertension    Hypothyroidism    LVH (left ventricular hypertrophy) 02/22/2022   PONV (postoperative nausea and vomiting)    Thyroid disease    Torn meniscus    left knee   Vertigo    Wears glasses     Past Surgical History:  Procedure Laterality Date   ABDOMINAL HYSTERECTOMY  1976   ANTERIOR CERVICAL DECOMP/DISCECTOMY FUSION N/A 05/06/2015   Procedure: Cervical four-five, Cervical five-six, Cervical six-seven Anterior cervical decompression/diskectomy/fusion;  Surgeon: Hilda Lias, MD;  Location: [facility];  Service: Neurosurgery;  Laterality: N/A;  C4-5 C5-6 C6-7 Anterior cervical decompression/diskectomy/fusion   APPENDECTOMY     BACK SURGERY     BREAST LUMPECTOMY WITH RADIOACTIVE SEED LOCALIZATION Right 11/15/2019   Procedure: RIGHT BREAST LUMPECTOMY WITH RADIOACTIVE SEED LOCALIZATION;  Surgeon: Griselda Miner, MD;  Location: [facility];  Service: General;  Laterality: Right;   BREAST SURGERY Bilateral    biopsy- 2 different times   CARPAL TUNNEL RELEASE     B/L   CARPAL TUNNEL RELEASE Left 04/15/2020  CHOLECYSTECTOMY     COLONOSCOPY  2014   COLONOSCOPY W/ POLYPECTOMY     dental implants     DILATION AND CURETTAGE OF UTERUS  1968   ELBOW ARTHROSCOPY Right    right   FOOT GANGLION EXCISION Left    left   KNEE ARTHROSCOPY Right    torn ligament   LAMINECTOMY     L 4 and 5; 2 separate surgeries   left hand     carpal tunel   right hand     3 surgeries- carpal tunel    Current Medications: Current Meds  Medication Sig   albuterol (VENTOLIN HFA) 108 (90 Base) MCG/ACT inhaler Inhale 2 puffs into the lungs every 6 (six) hours as  needed for wheezing or shortness of breath.   amLODipine (NORVASC) 5 MG tablet TAKE 1 TABLET EVERY DAY   anastrozole (ARIMIDEX) 1 MG tablet Take 1 mg by mouth daily.   aspirin EC 81 MG tablet Take 1 tablet (81 mg total) by mouth daily. Swallow whole.   Calcium Carb-Cholecalciferol (CALCIUM/VITAMIN D) 600-400 MG-UNIT TABS Take 1 tablet by mouth in the morning and at bedtime.   cyanocobalamin 2000 MCG tablet Take 2,000 mcg by mouth daily.   cyclobenzaprine (FLEXERIL) 10 MG tablet TAKE 1/2 TO 1 TABLET UP TO TWICE DAILY AS NEEDED   esomeprazole (NEXIUM) 40 MG capsule Take 40 mg by mouth daily at 12 noon.   Glucosamine 500 MG CAPS Take by mouth 2 (two) times daily.   levothyroxine (SYNTHROID) 50 MCG tablet TAKE 1 TABLET EVERY MORNING   losartan (COZAAR) 100 MG tablet Take 1 tablet (100 mg total) by mouth daily.   metoprolol succinate (TOPROL-XL) 50 MG 24 hr tablet Take 1 tablet (50 mg total) by mouth daily. Take with or immediately following a meal.   mometasone (NASONEX) 50 MCG/ACT nasal spray Place 2 sprays into the nose daily as needed (allergies).   omeprazole (PRILOSEC) 40 MG capsule Take 40 mg by mouth daily.   ondansetron (ZOFRAN) 8 MG tablet Take by mouth every 8 (eight) hours as needed for nausea or vomiting.   rosuvastatin (CRESTOR) 5 MG tablet Take 1 tablet (5 mg total) by mouth daily.     Allergies:   Beta adrenergic blockers, Mobic [meloxicam], Sulfa antibiotics, Tape, Symbicort [budesonide-formoterol fumarate], Aspirin, and Oxycodone   Social History   Socioeconomic History   Marital status: Married    Spouse name: Not on file   Number of children: Not on file   Years of education: Not on file   Highest education level: Not on file  Occupational History   Not on file  Tobacco Use   Smoking status: Never   Smokeless tobacco: Never  Vaping Use   Vaping Use: Never used  Substance and Sexual Activity   Alcohol use: No   Drug use: No   Sexual activity: Not on file  Other  Topics Concern   Not on file  Social History Narrative   Social History      Diet? none      Do you drink/eat things with caffeine? No       Marital status?    married                                What year were you married? 1961      Do you live in a house, apartment, assisted living, condo, trailer, etc.? home  Is it one or more stories? 1 level       How many persons live in your home? 2       Do you have any pets in your home? (please list) none      Highest level of education completed? Completed college 4 years      Current or past profession: social work activist      Do you exercise?        yes                              Type & how often? Aerobic 4 to 5 times a weeks      Advanced Directives      Do you have a living will? yes      Do you have a DNR form?                                  If not, do you want to discuss one?no       Do you have signed POA/HPOA for forms? yes      Functional Status      Do you have difficulty bathing or dressing yourself? no      Do you have difficulty preparing food or eating? No       Do you have difficulty managing your medications? No       Do you have difficulty managing your finances? No       Do you have difficulty affording your medications? No       Social Determinants of Health   Financial Resource Strain: Low Risk  (02/22/2022)   Overall Financial Resource Strain (CARDIA)    Difficulty of Paying Living Expenses: Not hard at all  Food Insecurity: Not on file  Transportation Needs: No Transportation Needs (02/22/2022)   PRAPARE - Administrator, Civil Service (Medical): No    Lack of Transportation (Non-Medical): No  Physical Activity: Insufficiently Active (02/22/2022)   Exercise Vital Sign    Days of Exercise per Week: 3 days    Minutes of Exercise per Session: 30 min  Stress: Not on file  Social Connections: Not on file     Family History: The patient's family history includes Breast  cancer in her sister; Cancer in her mother and sister; Diabetes in her daughter and daughter; Fibromyalgia in her daughter; HIV/AIDS in her sister; Heart attack in her brother, father, and mother; Heart disease in her father; Hypertension in her father and mother; Leukemia in her brother and brother; Lupus in her father, mother, and sister; Stroke in her mother; Valvular heart disease in her father. There is no history of Colon cancer.  ROS:   Please see the history of present illness.    (+) Hoarseness of voice (+) Seasonal allergies (+) LUE nerve pain All other systems reviewed and are negative.  EKGs/Labs/Other Studies Reviewed:    The following studies were reviewed today:  CTA Head/Neck  09/13/2022  ([facility]): IMPRESSION:  1. Normal head CT.  2. Normal CT angiography of the head and neck. No abnormality seen  to explain pulsatile tinnitus on the right.  3. Enlarged and heterogeneous thyroid, especially on the right.  Thyroid ultrasound recommended.  4. Previous ACDF C4 through C7. No complicating feature.   CT Cardiac Scoring  03/30/2022: FINDINGS: Left anterior descending artery: 1.37  Left circumflex artery: 17.4  IMPRESSION: Coronary calcium score of 18.8. This was 30th percentile for age-, race-, and sex-matched controls.   Aortic atherosclerosis.  Echocardiogram  03/08/2022:  1. Left ventricular ejection fraction, by estimation, is 60 to 65%. The  left ventricle has normal function. The left ventricle has no regional  wall motion abnormalities. Left ventricular diastolic parameters were  normal. The average left ventricular  global longitudinal strain is -15.4 %. The global longitudinal strain is  normal.   2. Right ventricular systolic function is normal. The right ventricular  size is normal.   3. Left atrial size was mildly dilated.   4. The mitral valve is abnormal. No evidence of mitral valve  regurgitation. No evidence of mitral stenosis.   5. The aortic  valve is tricuspid. Aortic valve regurgitation is not  visualized. No aortic stenosis is present.   6. The inferior vena cava is normal in size with greater than 50%  respiratory variability, suggesting right atrial pressure of 3 mmHg.    EKG:  EKG is personally reviewed.  10/20/2022:  EKG was not ordered. 02/22/2022: Sinus rhythm 70 bpm.   Recent Labs: No results found for requested labs within last 365 days.   Recent Lipid Panel    Component Value Date/Time   CHOL 169 02/01/2021 1202   TRIG 142 02/01/2021 1202   HDL 61 02/01/2021 1202   CHOLHDL 2.8 02/01/2021 1202   LDLCALC 84 02/01/2021 1202    Risk Assessment/Calculations:      Physical Exam:    Wt Readings from Last 3 Encounters:  10/20/22 171 lb 11.2 oz (77.9 kg)  04/25/22 170 lb 1.6 oz (77.2 kg)  02/22/22 168 lb 9.6 oz (76.5 kg)    VS:  BP 111/64 (BP Location: Left Arm, Patient Position: Sitting, Cuff Size: Large)   Pulse 75   Ht 5\' 1"  (1.549 m)   Wt 171 lb 11.2 oz (77.9 kg)   SpO2 97%   BMI 32.44 kg/m  , BMI Body mass index is 32.44 kg/m. GENERAL:  Well appearing HEENT: Pupils equal round and reactive, fundi not visualized, oral mucosa unremarkable NECK:  No jugular venous distention, waveform within normal limits, carotid upstroke brisk and symmetric, no bruits, no thyromegaly LUNGS:  Clear to auscultation bilaterally HEART:  RRR.  PMI not displaced or sustained,S1 and S2 within normal limits, no S3, no S4, no clicks, no rubs, no murmurs ABD:  Flat, positive bowel sounds normal in frequency in pitch, no bruits, no rebound, no guarding, no midline pulsatile mass, no hepatomegaly, no splenomegaly EXT:  2 plus pulses throughout, no edema, no cyanosis no clubbing SKIN:  No rashes no nodules NEURO:  Cranial nerves II through XII grossly intact, motor grossly intact throughout PSYCH:  Cognitively intact, oriented to person place and time   ASSESSMENT:    1. Essential hypertension   2. LVH (left ventricular  hypertrophy)   3. CAD in native artery     PLAN:    Essential hypertension Blood pressure well-controlled on losartan and metoprolol.  Continue with regular exercise.     CAD in native artery Coronary calcium score 05/2022 revealed a score of 18.8 which was 30th percentile.  She is doing a great job of exercising and has no ischemic symptoms.  Risk factors are well-controlled.  Continue aspirin, metoprolol, and rosuvastatin.  Disposition:  FU with Calel Pisarski C. Duke Salvia, MD, [facility] in 1 year.   Medication Adjustments/Labs and Tests Ordered: Current medicines are reviewed at length with  the patient today.  Concerns regarding medicines are outlined above.   No orders of the defined types were placed in this encounter.  No orders of the defined types were placed in this encounter.  Patient Instructions  Medication Instructions:  Your physician recommends that you continue on your current medications as directed. Please refer to the Current Medication list given to you today.  Follow-Up: At [facility], you and your health needs are our priority.  As part of our continuing mission to provide you with exceptional heart care, we have created designated Provider Care Teams.  These Care Teams include your primary Cardiologist (physician) and Advanced Practice Providers (APPs -  Physician Assistants and Nurse Practitioners) who all work together to provide you with the care you need, when you need it.  We recommend signing up for the patient portal called "MyChart".  Sign up information is provided on this After Visit Summary.  MyChart is used to connect with patients for Virtual Visits (Telemedicine).  Patients are able to view lab/test results, encounter notes, upcoming appointments, etc.  Non-urgent messages can be sent to your provider as well.   To learn more about what you can do with MyChart, go to [email or web address].    Your next appointment:   1 year follow up with Dr.  Prescilla Sours Stumpf,acting as a scribe for Chilton Si, MD.,have documented all relevant documentation on the behalf of Chilton Si, MD,as directed by  Chilton Si, MD while in the presence of Chilton Si, MD.  I, Mariyam Remington C. Duke Salvia, MD have reviewed all documentation for this visit.  The documentation of the exam, diagnosis, procedures, and orders on 10/20/2022 are all accurate and complete.   Signed, Chilton Si, MD  10/20/2022 4:45 PM     [facility]

## 2022-10-20 NOTE — Assessment & Plan Note (Signed)
Blood pressure well-controlled on losartan and metoprolol.  Continue with regular exercise.

## 2022-10-20 NOTE — Patient Instructions (Signed)
Medication Instructions:  Your physician recommends that you continue on your current medications as directed. Please refer to the Current Medication list given to you today.  Follow-Up: At [facility], you and your health needs are our priority.  As part of our continuing mission to provide you with exceptional heart care, we have created designated Provider Care Teams.  These Care Teams include your primary Cardiologist (physician) and Advanced Practice Providers (APPs -  Physician Assistants and Nurse Practitioners) who all work together to provide you with the care you need, when you need it.  We recommend signing up for the patient portal called "MyChart".  Sign up information is provided on this After Visit Summary.  MyChart is used to connect with patients for Virtual Visits (Telemedicine).  Patients are able to view lab/test results, encounter notes, upcoming appointments, etc.  Non-urgent messages can be sent to your provider as well.   To learn more about what you can do with MyChart, go to [email or web address].    Your next appointment:   1 year follow up with Dr. Duke Salvia

## 2022-10-20 NOTE — Assessment & Plan Note (Signed)
Coronary calcium score 05/2022 revealed a score of 18.8 which was 30th percentile.  She is doing a great job of exercising and has no ischemic symptoms.  Risk factors are well-controlled.  Continue aspirin, metoprolol, and rosuvastatin.

## 2022-10-20 NOTE — Progress Notes (Deleted)
Cardiology Office Note:    Date:  10/20/2022   ID:  Victoria Hudson, DOB 1946-01-28, MRN [id number]  PCP:  Victoria Coe, MD   [facility] Providers Cardiologist:  Victoria Si, MD    Referring MD: Victoria Coe, MD    History of Present Illness:    Victoria Hudson is a 77 y.o. female hypertension, hypothyroidism, GERD and prior breast cancer here to discuss family history of heart disease. She saw her PCP on 01/2022 and was feeling well. She wanted to be seen by cardiology due to family history of heart disease. She has been taking metroplol for palpitations and is stable.  At her visit 02/2022 blood pressure is well-controlled and she had no ischemic symptoms.  She was active but was not getting exercise.  She had a coronary CTA 03/2022 that revealed a score of 18.8 which was 30th percentile.  She reported a history of LVH.  She had an echo 03/2022 that revealed LVEF 60 to 65% with normal diastolic function and no LVH.  She follow-up with Victoria Shields, NP 04/2022 and was doing well.  She was started on aspirin and low-dose statin.    Past Medical History:  Diagnosis Date   Arthritis    Asthma    " allergies"   Chronic back pain    Complication of anesthesia 1968   "fought the anesthesia" problems with memory 05/06/15 at [facility]"   Depression    Family history of premature CAD 02/22/2022   Fibromyalgia    GERD (gastroesophageal reflux disease)    History of hiatal hernia    Hypertension    Hypothyroidism    LVH (left ventricular hypertrophy) 02/22/2022   PONV (postoperative nausea and vomiting)    Thyroid disease    Torn meniscus    left knee   Vertigo    Wears glasses     Past Surgical History:  Procedure Laterality Date   ABDOMINAL HYSTERECTOMY  1976   ANTERIOR CERVICAL DECOMP/DISCECTOMY FUSION N/A 05/06/2015   Procedure: Cervical four-five, Cervical five-six, Cervical six-seven Anterior cervical decompression/diskectomy/fusion;  Surgeon:  Hilda Lias, MD;  Location: [facility];  Service: Neurosurgery;  Laterality: N/A;  C4-5 C5-6 C6-7 Anterior cervical decompression/diskectomy/fusion   APPENDECTOMY     BACK SURGERY     BREAST LUMPECTOMY WITH RADIOACTIVE SEED LOCALIZATION Right 11/15/2019   Procedure: RIGHT BREAST LUMPECTOMY WITH RADIOACTIVE SEED LOCALIZATION;  Surgeon: Griselda Miner, MD;  Location: [facility];  Service: General;  Laterality: Right;   BREAST SURGERY Bilateral    biopsy- 2 different times   CARPAL TUNNEL RELEASE     B/L   CARPAL TUNNEL RELEASE Left 04/15/2020   CHOLECYSTECTOMY     COLONOSCOPY  2014   COLONOSCOPY W/ POLYPECTOMY     dental implants     DILATION AND CURETTAGE OF UTERUS  1968   ELBOW ARTHROSCOPY Right    right   FOOT GANGLION EXCISION Left    left   KNEE ARTHROSCOPY Right    torn ligament   LAMINECTOMY     L 4 and 5; 2 separate surgeries   left hand     carpal tunel   right hand     3 surgeries- carpal tunel    Current Medications: No outpatient medications have been marked as taking for the 10/20/22 encounter (Appointment) with Victoria Si, MD.     Allergies:   Beta adrenergic blockers, Mobic [meloxicam], Sulfa antibiotics, Tape, Symbicort [budesonide-formoterol fumarate], Aspirin, and Oxycodone  Social History   Socioeconomic History   Marital status: Married    Spouse name: Not on file   Number of children: Not on file   Years of education: Not on file   Highest education level: Not on file  Occupational History   Not on file  Tobacco Use   Smoking status: Never   Smokeless tobacco: Never  Vaping Use   Vaping Use: Never used  Substance and Sexual Activity   Alcohol use: No   Drug use: No   Sexual activity: Not on file  Other Topics Concern   Not on file  Social History Narrative   Social History      Diet? none      Do you drink/eat things with caffeine? No       Marital status?    married                                What year  were you married? 1961      Do you live in a house, apartment, assisted living, condo, trailer, etc.? home      Is it one or more stories? 1 level       How many persons live in your home? 2       Do you have any pets in your home? (please list) none      Highest level of education completed? Completed college 4 years      Current or past profession: social work activist      Do you exercise?        yes                              Type & how often? Aerobic 4 to 5 times a weeks      Advanced Directives      Do you have a living will? yes      Do you have a DNR form?                                  If not, do you want to discuss one?no       Do you have signed POA/HPOA for forms? yes      Functional Status      Do you have difficulty bathing or dressing yourself? no      Do you have difficulty preparing food or eating? No       Do you have difficulty managing your medications? No       Do you have difficulty managing your finances? No       Do you have difficulty affording your medications? No       Social Determinants of Health   Financial Resource Strain: Low Risk  (02/22/2022)   Overall Financial Resource Strain (CARDIA)    Difficulty of Paying Living Expenses: Not hard at all  Food Insecurity: Not on file  Transportation Needs: No Transportation Needs (02/22/2022)   PRAPARE - Administrator, Civil Service (Medical): No    Lack of Transportation (Non-Medical): No  Physical Activity: Insufficiently Active (02/22/2022)   Exercise Vital Sign    Days of Exercise per Week: 3 days    Minutes of Exercise per Session: 30 min  Stress: Not on file  Social Connections: Not on  file     Family History: The patient's family history includes Breast cancer in her sister; Cancer in her mother and sister; Diabetes in her daughter and daughter; Fibromyalgia in her daughter; HIV/AIDS in her sister; Heart attack in her brother, father, and mother; Heart disease in her  father; Hypertension in her father and mother; Leukemia in her brother and brother; Lupus in her father, mother, and sister; Stroke in her mother; Valvular heart disease in her father. There is no history of Colon cancer.  ROS:   Please see the history of present illness.    (+) back pain (+) neck pain (+) arthralgias (+) nausea (+) diarrhea  (+) lip numbness  All other systems reviewed and are negative.  EKGs/Labs/Other Studies Reviewed:    The following studies were reviewed today: No prior cardiovascular studies available.   EKG:  EKG is personally reviewed.  02/22/2022: Sinus rhythm 70 bpm.   Recent Labs: No results found for requested labs within last 365 days.  Recent Lipid Panel    Component Value Date/Time   CHOL 169 02/01/2021 1202   TRIG 142 02/01/2021 1202   HDL 61 02/01/2021 1202   CHOLHDL 2.8 02/01/2021 1202   LDLCALC 84 02/01/2021 1202     Risk Assessment/Calculations:      Physical Exam:    VS:  There were no vitals taken for this visit.    Wt Readings from Last 3 Encounters:  04/25/22 170 lb 1.6 oz (77.2 kg)  02/22/22 168 lb 9.6 oz (76.5 kg)  02/15/21 180 lb (81.6 kg)     VS:  There were no vitals taken for this visit. , BMI There is no height or weight on file to calculate BMI. GENERAL:  Well appearing HEENT: Pupils equal round and reactive, fundi not visualized, oral mucosa unremarkable NECK:  No jugular venous distention, waveform within normal limits, carotid upstroke brisk and symmetric, no bruits, no thyromegaly LUNGS:  Clear to auscultation bilaterally HEART:  RRR.  PMI not displaced or sustained,S1 and S2 within normal limits, no S3, no S4, no clicks, no rubs, no murmurs ABD:  Flat, positive bowel sounds normal in frequency in pitch, no bruits, no rebound, no guarding, no midline pulsatile mass, no hepatomegaly, no splenomegaly EXT:  2 plus pulses throughout, no edema, no cyanosis no clubbing SKIN:  No rashes no nodules NEURO:  Cranial  nerves II through XII grossly intact, motor grossly intact throughout PSYCH:  Cognitively intact, oriented to person place and time   ASSESSMENT:    No diagnosis found.  PLAN:    No problem-specific Assessment & Plan notes found for this encounter.    Disposition:  Follow up with APP in 2 months.   Medication Adjustments/Labs and Tests Ordered: Current medicines are reviewed at length with the patient today.  Concerns regarding medicines are outlined above.  No orders of the defined types were placed in this encounter.  No orders of the defined types were placed in this encounter.   There are no Patient Instructions on file for this visit.      Signed, Victoria Si, MD  10/20/2022 7:55 AM    [facility]

## 2022-11-14 DIAGNOSIS — K219 Gastro-esophageal reflux disease without esophagitis: Secondary | ICD-10-CM | POA: Diagnosis not present

## 2022-11-14 DIAGNOSIS — Z800 Family history of malignant neoplasm of digestive organs: Secondary | ICD-10-CM | POA: Diagnosis not present

## 2022-12-07 DIAGNOSIS — M25512 Pain in left shoulder: Secondary | ICD-10-CM | POA: Diagnosis not present

## 2022-12-07 DIAGNOSIS — M542 Cervicalgia: Secondary | ICD-10-CM | POA: Diagnosis not present

## 2023-01-04 DIAGNOSIS — M25512 Pain in left shoulder: Secondary | ICD-10-CM | POA: Diagnosis not present

## 2023-01-19 DIAGNOSIS — M25512 Pain in left shoulder: Secondary | ICD-10-CM | POA: Diagnosis not present

## 2023-01-20 DIAGNOSIS — M47812 Spondylosis without myelopathy or radiculopathy, cervical region: Secondary | ICD-10-CM | POA: Diagnosis not present

## 2023-01-27 DIAGNOSIS — M25512 Pain in left shoulder: Secondary | ICD-10-CM | POA: Diagnosis not present

## 2023-02-21 DIAGNOSIS — Z008 Encounter for other general examination: Secondary | ICD-10-CM | POA: Diagnosis not present

## 2023-02-21 DIAGNOSIS — F321 Major depressive disorder, single episode, moderate: Secondary | ICD-10-CM | POA: Diagnosis not present

## 2023-02-21 DIAGNOSIS — Z6827 Body mass index (BMI) 27.0-27.9, adult: Secondary | ICD-10-CM | POA: Diagnosis not present

## 2023-02-21 DIAGNOSIS — E785 Hyperlipidemia, unspecified: Secondary | ICD-10-CM | POA: Diagnosis not present

## 2023-02-21 DIAGNOSIS — I700 Atherosclerosis of aorta: Secondary | ICD-10-CM | POA: Diagnosis not present

## 2023-02-21 DIAGNOSIS — I10 Essential (primary) hypertension: Secondary | ICD-10-CM | POA: Diagnosis not present

## 2023-02-21 DIAGNOSIS — K219 Gastro-esophageal reflux disease without esophagitis: Secondary | ICD-10-CM | POA: Diagnosis not present

## 2023-02-21 DIAGNOSIS — E039 Hypothyroidism, unspecified: Secondary | ICD-10-CM | POA: Diagnosis not present

## 2023-02-21 DIAGNOSIS — C50911 Malignant neoplasm of unspecified site of right female breast: Secondary | ICD-10-CM | POA: Diagnosis not present

## 2023-02-21 DIAGNOSIS — E663 Overweight: Secondary | ICD-10-CM | POA: Diagnosis not present

## 2023-02-27 DIAGNOSIS — Z6829 Body mass index (BMI) 29.0-29.9, adult: Secondary | ICD-10-CM | POA: Diagnosis not present

## 2023-02-27 DIAGNOSIS — M542 Cervicalgia: Secondary | ICD-10-CM | POA: Diagnosis not present

## 2023-02-27 DIAGNOSIS — M4302 Spondylolysis, cervical region: Secondary | ICD-10-CM | POA: Diagnosis not present

## 2023-02-28 DIAGNOSIS — Z008 Encounter for other general examination: Secondary | ICD-10-CM | POA: Diagnosis not present

## 2023-02-28 DIAGNOSIS — D84821 Immunodeficiency due to drugs: Secondary | ICD-10-CM | POA: Diagnosis not present

## 2023-03-06 DIAGNOSIS — K219 Gastro-esophageal reflux disease without esophagitis: Secondary | ICD-10-CM | POA: Diagnosis not present

## 2023-03-06 DIAGNOSIS — Z1211 Encounter for screening for malignant neoplasm of colon: Secondary | ICD-10-CM | POA: Diagnosis not present

## 2023-03-06 DIAGNOSIS — Z800 Family history of malignant neoplasm of digestive organs: Secondary | ICD-10-CM | POA: Diagnosis not present

## 2023-03-06 DIAGNOSIS — R1310 Dysphagia, unspecified: Secondary | ICD-10-CM | POA: Diagnosis not present

## 2023-03-08 DIAGNOSIS — R011 Cardiac murmur, unspecified: Secondary | ICD-10-CM | POA: Diagnosis not present

## 2023-03-08 DIAGNOSIS — M1990 Unspecified osteoarthritis, unspecified site: Secondary | ICD-10-CM | POA: Diagnosis not present

## 2023-03-08 DIAGNOSIS — J45909 Unspecified asthma, uncomplicated: Secondary | ICD-10-CM | POA: Diagnosis not present

## 2023-03-08 DIAGNOSIS — I2510 Atherosclerotic heart disease of native coronary artery without angina pectoris: Secondary | ICD-10-CM | POA: Diagnosis not present

## 2023-03-08 DIAGNOSIS — E041 Nontoxic single thyroid nodule: Secondary | ICD-10-CM | POA: Diagnosis not present

## 2023-03-08 DIAGNOSIS — K219 Gastro-esophageal reflux disease without esophagitis: Secondary | ICD-10-CM | POA: Diagnosis not present

## 2023-03-08 DIAGNOSIS — I10 Essential (primary) hypertension: Secondary | ICD-10-CM | POA: Diagnosis not present

## 2023-03-08 DIAGNOSIS — E039 Hypothyroidism, unspecified: Secondary | ICD-10-CM | POA: Diagnosis not present

## 2023-03-08 DIAGNOSIS — M797 Fibromyalgia: Secondary | ICD-10-CM | POA: Diagnosis not present

## 2023-03-08 DIAGNOSIS — Z23 Encounter for immunization: Secondary | ICD-10-CM | POA: Diagnosis not present

## 2023-03-08 DIAGNOSIS — E1165 Type 2 diabetes mellitus with hyperglycemia: Secondary | ICD-10-CM | POA: Diagnosis not present

## 2023-03-09 DIAGNOSIS — Z170 Estrogen receptor positive status [ER+]: Secondary | ICD-10-CM | POA: Diagnosis not present

## 2023-03-09 DIAGNOSIS — C50411 Malignant neoplasm of upper-outer quadrant of right female breast: Secondary | ICD-10-CM | POA: Diagnosis not present

## 2023-03-09 DIAGNOSIS — Z1231 Encounter for screening mammogram for malignant neoplasm of breast: Secondary | ICD-10-CM | POA: Diagnosis not present

## 2023-03-16 DIAGNOSIS — Z170 Estrogen receptor positive status [ER+]: Secondary | ICD-10-CM | POA: Diagnosis not present

## 2023-03-16 DIAGNOSIS — C50411 Malignant neoplasm of upper-outer quadrant of right female breast: Secondary | ICD-10-CM | POA: Diagnosis not present

## 2023-03-17 ENCOUNTER — Other Ambulatory Visit (HOSPITAL_BASED_OUTPATIENT_CLINIC_OR_DEPARTMENT_OTHER): Payer: Self-pay | Admitting: Family

## 2023-04-18 DIAGNOSIS — R1310 Dysphagia, unspecified: Secondary | ICD-10-CM | POA: Diagnosis not present

## 2023-04-18 DIAGNOSIS — Z800 Family history of malignant neoplasm of digestive organs: Secondary | ICD-10-CM | POA: Diagnosis not present

## 2023-04-18 DIAGNOSIS — K648 Other hemorrhoids: Secondary | ICD-10-CM | POA: Diagnosis not present

## 2023-04-18 DIAGNOSIS — K5730 Diverticulosis of large intestine without perforation or abscess without bleeding: Secondary | ICD-10-CM | POA: Diagnosis not present

## 2023-04-18 DIAGNOSIS — Z1211 Encounter for screening for malignant neoplasm of colon: Secondary | ICD-10-CM | POA: Diagnosis not present

## 2023-04-18 DIAGNOSIS — K219 Gastro-esophageal reflux disease without esophagitis: Secondary | ICD-10-CM | POA: Diagnosis not present

## 2023-05-03 DIAGNOSIS — H25813 Combined forms of age-related cataract, bilateral: Secondary | ICD-10-CM | POA: Diagnosis not present

## 2023-05-03 DIAGNOSIS — H353121 Nonexudative age-related macular degeneration, left eye, early dry stage: Secondary | ICD-10-CM | POA: Diagnosis not present

## 2023-05-03 DIAGNOSIS — H35033 Hypertensive retinopathy, bilateral: Secondary | ICD-10-CM | POA: Diagnosis not present

## 2023-05-03 DIAGNOSIS — E119 Type 2 diabetes mellitus without complications: Secondary | ICD-10-CM | POA: Diagnosis not present

## 2023-05-23 DIAGNOSIS — H25812 Combined forms of age-related cataract, left eye: Secondary | ICD-10-CM | POA: Diagnosis not present

## 2023-06-08 ENCOUNTER — Emergency Department (HOSPITAL_BASED_OUTPATIENT_CLINIC_OR_DEPARTMENT_OTHER)
Admission: EM | Admit: 2023-06-08 | Discharge: 2023-06-08 | Disposition: A | Discharge status: Home / Self Care | Payer: No Typology Code available for payment source | Attending: Emergency Medicine | Admitting: Emergency Medicine

## 2023-06-08 ENCOUNTER — Other Ambulatory Visit: Payer: Self-pay

## 2023-06-08 DIAGNOSIS — R1030 Lower abdominal pain, unspecified: Secondary | ICD-10-CM | POA: Diagnosis not present

## 2023-06-08 DIAGNOSIS — R1084 Generalized abdominal pain: Secondary | ICD-10-CM | POA: Insufficient documentation

## 2023-06-08 DIAGNOSIS — K5900 Constipation, unspecified: Secondary | ICD-10-CM | POA: Diagnosis not present

## 2023-06-08 LAB — COMPREHENSIVE METABOLIC PANEL
ALT: 18 U/L (ref 0–44)
AST: 25 U/L (ref 15–41)
Albumin: 4.4 g/dL (ref 3.5–5.0)
Alkaline Phosphatase: 106 U/L (ref 38–126)
Anion gap: 11 (ref 5–15)
BUN: 15 mg/dL (ref 8–23)
CO2: 25 mmol/L (ref 22–32)
Calcium: 9.8 mg/dL (ref 8.9–10.3)
Chloride: 101 mmol/L (ref 98–111)
Creatinine, Ser: 0.83 mg/dL (ref 0.44–1.00)
GFR, Estimated: >60 mL/min (ref >60)
Glucose, Bld: 111 mg/dL — ABNORMAL HIGH (ref 70–99)
Potassium: 3.9 mmol/L (ref 3.5–5.1)
Sodium: 137 mmol/L (ref 135–145)
Total Bilirubin: 0.9 mg/dL (ref 0.0–1.2)
Total Protein: 7.3 g/dL (ref 6.5–8.1)

## 2023-06-08 LAB — CBC
HCT: 39.7 % (ref 36.0–46.0)
Hemoglobin: 13.5 g/dL (ref 12.0–15.0)
MCH: 30 pg (ref 26.0–34.0)
MCHC: 34 g/dL (ref 30.0–36.0)
MCV: 88.2 fL (ref 80.0–100.0)
Platelets: 314 10*3/uL (ref 150–400)
RBC: 4.5 MIL/uL (ref 3.87–5.11)
RDW: 13.2 % (ref 11.5–15.5)
WBC: 12.1 10*3/uL — ABNORMAL HIGH (ref 4.0–10.5)
nRBC: 0 % (ref 0.0–0.2)

## 2023-06-08 LAB — URINALYSIS, ROUTINE W REFLEX MICROSCOPIC
Bilirubin Urine: NEGATIVE
Glucose, UA: NEGATIVE mg/dL
Hgb urine dipstick: NEGATIVE
Ketones, ur: NEGATIVE mg/dL
Leukocytes,Ua: NEGATIVE
Nitrite: NEGATIVE
Protein, ur: NEGATIVE mg/dL
Specific Gravity, Urine: 1.016 (ref 1.005–1.030)
pH: 7.5 (ref 5.0–8.0)

## 2023-06-08 LAB — LIPASE, BLOOD: Lipase: 11 U/L (ref 11–51)

## 2023-06-08 MED ORDER — ONDANSETRON HCL 4 MG/2ML IJ SOLN
4.0000 mg | Freq: Once | INTRAMUSCULAR | Status: AC
  Administered 2023-06-08: 4 mg via INTRAVENOUS
  Filled 2023-06-08: qty 2

## 2023-06-08 MED ORDER — ONDANSETRON HCL 4 MG PO TABS: 4.0000 mg | ORAL_TABLET | Freq: Three times a day (TID) | ORAL | 0 refills | Status: AC | PRN

## 2023-06-08 NOTE — Discharge Instructions (Signed)
 You were seen today for constipation. Please use MiraLAX going forward.  Start with 2 scoops of powder in 32 ounces of fluid.  Escalate to 4 scoops of powder in 32 ounces of fluid 6 hours later.  The next morning you may start with 4 scoops in 32 ounces of fluid and escalate to 8 to scoops in 32 ounces of fluid if you remain unsuccessful in having a bowel movement.  If this is ineffective, you will need to follow back up with your primary care provider.  Once you have had a substantial bowel movement decrease to 2 scoops a day and a minimum of 16 ounces of fluid per scoop.

## 2023-06-08 NOTE — ED Triage Notes (Signed)
 Stomach pain x5 days. Reports constipation. Has been using fleet enemas. Having 1 BM a day- typically has several. Denies dark stool or blood. Afebrile. Denies CP, SOB. Intermittent nausea at baseline due to cancer medication- 5 year course.

## 2023-06-09 NOTE — ED Provider Notes (Signed)
 [place] EMERGENCY DEPARTMENT AT [facility] Provider Note   CSN: [id number] Arrival date & time: 06/08/23  1513     History Chief Complaint  Patient presents with   Abdominal Pain    HPI Victoria Hudson is a 78 y.o. female presenting for abdominal pain. She reports inability to defecate for the last 5 days.  Has tried using multiple medications at home without improvement.  Unfortunately she had a 5-hour wait in the emergency room and approximately 10 minutes prior to my evaluation of the patient, she had a large bowel movement states all her symptoms were completely resolved.  Denies fevers chills nausea vomiting shortness of breath  Patient's recorded medical, surgical, social, medication list and allergies were reviewed in the Snapshot window as part of the initial history.   Review of Systems   Review of Systems  Constitutional:  Negative for chills and fever.  HENT:  Negative for ear pain and sore throat.   Eyes:  Negative for pain and visual disturbance.  Respiratory:  Negative for cough and shortness of breath.   Cardiovascular:  Negative for chest pain and palpitations.  Gastrointestinal:  Positive for constipation. Negative for abdominal pain and vomiting.  Genitourinary:  Negative for dysuria and hematuria.  Musculoskeletal:  Negative for arthralgias and back pain.  Skin:  Negative for color change and rash.  Neurological:  Negative for seizures and syncope.  All other systems reviewed and are negative.   Physical Exam Updated Vital Signs BP 131/66 (BP Location: Left Arm)   Pulse 71   Temp 98.3 F (36.8 C) (Oral)   Resp 16   SpO2 100%  Physical Exam Vitals and nursing note reviewed.  Constitutional:      General: She is not in acute distress.    Appearance: She is well-developed.  HENT:     Head: Normocephalic and atraumatic.  Eyes:     Conjunctiva/sclera: Conjunctivae normal.  Cardiovascular:     Rate and Rhythm: Normal rate and regular  rhythm.     Heart sounds: No murmur heard. Pulmonary:     Effort: Pulmonary effort is normal. No respiratory distress.     Breath sounds: Normal breath sounds.  Abdominal:     General: There is no distension.     Palpations: Abdomen is soft.     Tenderness: There is no abdominal tenderness. There is no right CVA tenderness or left CVA tenderness.  Musculoskeletal:        General: No swelling or tenderness. Normal range of motion.     Cervical back: Neck supple.  Skin:    General: Skin is warm and dry.  Neurological:     General: No focal deficit present.     Mental Status: She is alert and oriented to person, place, and time. Mental status is at baseline.     Cranial Nerves: No cranial nerve deficit.      ED Course/ Medical Decision Making/ A&P    Procedures Procedures   Medications Ordered in ED Medications  ondansetron  (ZOFRAN ) injection 4 mg (4 mg Intravenous Given 06/08/23 2132)    Medical Decision Making:   78 year old female presenting with concern for possible developing fecal impaction.  During her protracted wait in the emergency room,Medication she had taken prior to arrival were successful in causing a large bowel movement.  She endorses complete symptomatic resolution does not want any further workup.  Labs were done in the triage process and revealed no focal metabolic or hematologic abnormality.  Discussed  ongoing care and importance of follow-up with PCP and patient feels comfortable with discharge at this time.  Clinically, presentation not consistent with small bowel obstruction, appendicitis, cholecystitis or any other emergent pathology.  Disposition:  I have considered need for hospitalization, however, considering all of the above, I believe this patient is stable for discharge at this time.  Patient/family educated about specific return precautions for given chief complaint and symptoms.  Patient/family educated about follow-up with PC.     Patient/family  expressed understanding of return precautions and need for follow-up. Patient spoken to regarding all imaging and laboratory results and appropriate follow up for these results. All education provided in verbal form with additional information in written form. Time was allowed for answering of patient questions. Patient discharged.    Emergency Department Medication Summary:   Medications  ondansetron  (ZOFRAN ) injection 4 mg (4 mg Intravenous Given 06/08/23 2132)        Clinical Impression:  1. Generalized abdominal pain   2. Constipation, unspecified constipation type      Discharge   Final Clinical Impression(s) / ED Diagnoses Final diagnoses:  Generalized abdominal pain  Constipation, unspecified constipation type    Rx / DC Orders ED Discharge Orders          Ordered    ondansetron  (ZOFRAN ) 4 MG tablet  Every 8 hours PRN        06/08/23 2125              Jerral Meth, MD 06/09/23 1615

## 2023-06-20 DIAGNOSIS — H25812 Combined forms of age-related cataract, left eye: Secondary | ICD-10-CM | POA: Diagnosis not present

## 2023-06-20 DIAGNOSIS — H269 Unspecified cataract: Secondary | ICD-10-CM | POA: Diagnosis not present

## 2023-06-20 DIAGNOSIS — H268 Other specified cataract: Secondary | ICD-10-CM | POA: Diagnosis not present

## 2023-06-21 DIAGNOSIS — M67332 Transient synovitis, left wrist: Secondary | ICD-10-CM | POA: Diagnosis not present

## 2023-06-21 DIAGNOSIS — M65351 Trigger finger, right little finger: Secondary | ICD-10-CM | POA: Diagnosis not present

## 2023-06-23 DIAGNOSIS — C50411 Malignant neoplasm of upper-outer quadrant of right female breast: Secondary | ICD-10-CM | POA: Diagnosis not present

## 2023-06-23 DIAGNOSIS — Z170 Estrogen receptor positive status [ER+]: Secondary | ICD-10-CM | POA: Diagnosis not present

## 2023-06-30 DIAGNOSIS — H25811 Combined forms of age-related cataract, right eye: Secondary | ICD-10-CM | POA: Diagnosis not present

## 2023-07-06 DIAGNOSIS — L309 Dermatitis, unspecified: Secondary | ICD-10-CM | POA: Diagnosis not present

## 2023-07-06 DIAGNOSIS — B001 Herpesviral vesicular dermatitis: Secondary | ICD-10-CM | POA: Diagnosis not present

## 2023-07-11 DIAGNOSIS — H25811 Combined forms of age-related cataract, right eye: Secondary | ICD-10-CM | POA: Diagnosis not present

## 2023-07-11 DIAGNOSIS — H268 Other specified cataract: Secondary | ICD-10-CM | POA: Diagnosis not present

## 2023-09-05 DIAGNOSIS — E039 Hypothyroidism, unspecified: Secondary | ICD-10-CM | POA: Diagnosis not present

## 2023-09-05 DIAGNOSIS — E1165 Type 2 diabetes mellitus with hyperglycemia: Secondary | ICD-10-CM | POA: Diagnosis not present

## 2023-09-05 DIAGNOSIS — I2510 Atherosclerotic heart disease of native coronary artery without angina pectoris: Secondary | ICD-10-CM | POA: Diagnosis not present

## 2023-09-05 DIAGNOSIS — I10 Essential (primary) hypertension: Secondary | ICD-10-CM | POA: Diagnosis not present

## 2023-09-08 DIAGNOSIS — M797 Fibromyalgia: Secondary | ICD-10-CM | POA: Diagnosis not present

## 2023-09-08 DIAGNOSIS — E041 Nontoxic single thyroid nodule: Secondary | ICD-10-CM | POA: Diagnosis not present

## 2023-09-08 DIAGNOSIS — I10 Essential (primary) hypertension: Secondary | ICD-10-CM | POA: Diagnosis not present

## 2023-09-08 DIAGNOSIS — I25118 Atherosclerotic heart disease of native coronary artery with other forms of angina pectoris: Secondary | ICD-10-CM | POA: Diagnosis not present

## 2023-09-08 DIAGNOSIS — E039 Hypothyroidism, unspecified: Secondary | ICD-10-CM | POA: Diagnosis not present

## 2023-09-08 DIAGNOSIS — E1165 Type 2 diabetes mellitus with hyperglycemia: Secondary | ICD-10-CM | POA: Diagnosis not present

## 2023-09-08 DIAGNOSIS — Z0000 Encounter for general adult medical examination without abnormal findings: Secondary | ICD-10-CM | POA: Diagnosis not present

## 2023-09-08 DIAGNOSIS — M1990 Unspecified osteoarthritis, unspecified site: Secondary | ICD-10-CM | POA: Diagnosis not present

## 2023-09-08 DIAGNOSIS — I2510 Atherosclerotic heart disease of native coronary artery without angina pectoris: Secondary | ICD-10-CM | POA: Diagnosis not present

## 2023-09-08 DIAGNOSIS — R011 Cardiac murmur, unspecified: Secondary | ICD-10-CM | POA: Diagnosis not present

## 2023-09-08 DIAGNOSIS — J45909 Unspecified asthma, uncomplicated: Secondary | ICD-10-CM | POA: Diagnosis not present

## 2023-09-08 DIAGNOSIS — K219 Gastro-esophageal reflux disease without esophagitis: Secondary | ICD-10-CM | POA: Diagnosis not present

## 2023-09-11 DIAGNOSIS — M65849 Other synovitis and tenosynovitis, unspecified hand: Secondary | ICD-10-CM | POA: Diagnosis not present

## 2023-09-11 DIAGNOSIS — Z4789 Encounter for other orthopedic aftercare: Secondary | ICD-10-CM | POA: Diagnosis not present

## 2023-09-11 DIAGNOSIS — M65352 Trigger finger, left little finger: Secondary | ICD-10-CM | POA: Diagnosis not present

## 2023-09-11 DIAGNOSIS — M67332 Transient synovitis, left wrist: Secondary | ICD-10-CM | POA: Diagnosis not present

## 2023-10-19 DIAGNOSIS — Z6829 Body mass index (BMI) 29.0-29.9, adult: Secondary | ICD-10-CM | POA: Diagnosis not present

## 2023-10-19 DIAGNOSIS — M542 Cervicalgia: Secondary | ICD-10-CM | POA: Diagnosis not present

## 2023-10-24 ENCOUNTER — Encounter (HOSPITAL_BASED_OUTPATIENT_CLINIC_OR_DEPARTMENT_OTHER): Payer: Self-pay | Admitting: Family

## 2023-10-24 ENCOUNTER — Ambulatory Visit (INDEPENDENT_AMBULATORY_CARE_PROVIDER_SITE_OTHER): Admitting: Family

## 2023-10-24 VITALS — BP 116/54 | HR 75 | Ht 62.0 in | Wt 157.0 lb

## 2023-10-24 DIAGNOSIS — I2510 Atherosclerotic heart disease of native coronary artery without angina pectoris: Secondary | ICD-10-CM

## 2023-10-24 DIAGNOSIS — E785 Hyperlipidemia, unspecified: Secondary | ICD-10-CM | POA: Diagnosis not present

## 2023-10-24 DIAGNOSIS — I10 Essential (primary) hypertension: Secondary | ICD-10-CM | POA: Diagnosis not present

## 2023-10-24 MED ORDER — ROSUVASTATIN CALCIUM 5 MG PO TABS
5.0000 mg | ORAL_TABLET | Freq: Every day | ORAL | 3 refills | Status: AC
Start: 2023-10-24 — End: ?

## 2023-10-24 NOTE — Patient Instructions (Addendum)
 Medication Instructions:  Continue your current medications.   *If you need a refill on your cardiac medications before your next appointment, please call your pharmacy*  Lab Work: Your cholesterol in April looked fantastic!  Testing/Procedures: Your EKG today looked great!  Follow-Up: At [facility], you and your health needs are our priority.  As part of our continuing mission to provide you with exceptional heart care, our providers are all part of one team.  This team includes your primary Cardiologist (physician) and Advanced Practice Providers or APPs (Physician Assistants and Nurse Practitioners) who all work together to provide you with the care you need, when you need it.  Your next appointment:   1 year(s)  Provider:   Maudine Sos, MD, Slater Duncan, NP, or Neomi Banks, NP    We recommend signing up for the patient portal called "MyChart".  Sign up information is provided on this After Visit Summary.  MyChart is used to connect with patients for Virtual Visits (Telemedicine).  Patients are able to view lab/test results, encounter notes, upcoming appointments, etc.  Non-urgent messages can be sent to your provider as well.   To learn more about what you can do with MyChart, go to [email or web address].   Other Instructions  [facility] Stroke Support Group: Contact: Ms. Carlen Chasten, DPT [phone] [email or web address] Meeting on the 2nd Thursday of each month 4pm-5pm at [facility] [place] [phone])

## 2023-10-24 NOTE — Progress Notes (Signed)
 Cardiology Office Note:  .   Date:  10/24/2023  ID:  Victoria Hudson, DOB 11/08/1945, MRN [id number] PCP: Benedetto Brady, MD  [facility] Providers Cardiologist:  Maudine Sos, MD    History of Present Illness: .   Victoria Hudson is a 78 y.o. female with a hx of hypertension, coronary artery disease, hypothyroidism, GERD, prior breast cancer.   Initial evaluation to discuss family history of heart disease. Father passed away from leaking heart valve and PPM. Mother had surgery at 2 leading to stroke which parlayzed her and passed of a heart attack. Siblings also with heart disease. Daughter had ablation.Noted previous echo with her PCP which reported LVH.    Subsequent workup included echo 03/08/22 normal LVEF 60-65%, no RWMA, mild thickening of mitral valve with no regurgitation nor stenosis. Cardiac CT with aortic atherosclerosis and coronary calcium  score of 18.8 placing her in the 30th percentile for age/race/sex matched controls.   Presents today for follow up independently. Very active caretaking for her husband who has had a stroke with is understandably stressful. He does have hospice that comes to help him bathe. Used to enjoy crafting but has had more pain in her hands for which she is following with her chiropractor and orthopedist. Reports no shortness of breath nor dyspnea on exertion. Reports no chest pain, pressure, or tightness. No edema, orthopnea, PND. Reports no palpitations.  Reports episodes of dizziness which she attributes to her vertigo. She is exercising once per week in the yard.  ROS: Please see the history of present illness.    All other systems reviewed and are negative.   Studies Reviewed: .        Cardiac Studies & Procedures   ______________________________________________________________________________________________     ECHOCARDIOGRAM  ECHOCARDIOGRAM COMPLETE 03/08/2022  Narrative ECHOCARDIOGRAM REPORT    Patient Name:    Victoria Hudson Date of Exam: 03/08/2022 Medical Rec #:  [id number]            Height:       61.0 in Accession #:    [id number]           Weight:       168.6 lb Date of Birth:  04-10-1946            BSA:          1.757 m Patient Age:    43 years             BP:           130/70 mmHg Patient Gender: F                    HR:           72 bpm. Exam Location:  Outpatient  Procedure: 2D Echo, 3D Echo, Color Doppler, Cardiac Doppler and Strain Analysis  Indications:    LVH (left ventricular hypertrophy  History:        Patient has no prior history of Echocardiogram examinations. CAD; Risk Factors:Hypertension and Non-Smoker. Family history of premature CAD; prior breast cancer.  Sonographer:    Gelene Kelly RDCS Referring Phys: [id number] TIFFANY Elverta  IMPRESSIONS   1. Left ventricular ejection fraction, by estimation, is 60 to 65%. The left ventricle has normal function. The left ventricle has no regional wall motion abnormalities. Left ventricular diastolic parameters were normal. The average left ventricular global longitudinal strain is -15.4 %. The global longitudinal strain is normal. 2. Right ventricular systolic function is normal. The  right ventricular size is normal. 3. Left atrial size was mildly dilated. 4. The mitral valve is abnormal. No evidence of mitral valve regurgitation. No evidence of mitral stenosis. 5. The aortic valve is tricuspid. Aortic valve regurgitation is not visualized. No aortic stenosis is present. 6. The inferior vena cava is normal in size with greater than 50% respiratory variability, suggesting right atrial pressure of 3 mmHg.  FINDINGS Left Ventricle: Left ventricular ejection fraction, by estimation, is 60 to 65%. The left ventricle has normal function. The left ventricle has no regional wall motion abnormalities. The average left ventricular global longitudinal strain is -15.4 %. The global longitudinal strain is normal. The left ventricular  internal cavity size was normal in size. There is borderline left ventricular hypertrophy. Left ventricular diastolic parameters were normal.  Right Ventricle: The right ventricular size is normal. No increase in right ventricular wall thickness. Right ventricular systolic function is normal.  Left Atrium: Left atrial size was mildly dilated.  Right Atrium: Right atrial size was normal in size.  Pericardium: There is no evidence of pericardial effusion.  Mitral Valve: The mitral valve is abnormal. Mild mitral annular calcification. No evidence of mitral valve regurgitation. No evidence of mitral valve stenosis.  Tricuspid Valve: The tricuspid valve is normal in structure. Tricuspid valve regurgitation is not demonstrated. No evidence of tricuspid stenosis.  Aortic Valve: The aortic valve is tricuspid. Aortic valve regurgitation is not visualized. No aortic stenosis is present.  Pulmonic Valve: The pulmonic valve was normal in structure. Pulmonic valve regurgitation is trivial. No evidence of pulmonic stenosis.  Aorta: The aortic root is normal in size and structure.  Venous: The inferior vena cava is normal in size with greater than 50% respiratory variability, suggesting right atrial pressure of 3 mmHg.  IAS/Shunts: No atrial level shunt detected by color flow Doppler.   LEFT VENTRICLE PLAX 2D LVIDd:         3.72 cm   Diastology LVIDs:         1.89 cm   LV e' medial:    5.55 cm/s LV PW:         1.14 cm   LV E/e' medial:  10.4 LV IVS:        1.17 cm   LV e' lateral:   4.68 cm/s LVOT diam:     1.90 cm   LV E/e' lateral: 12.3 LV SV:         70 LV SV Index:   40        2D Longitudinal Strain LVOT Area:     2.84 cm  2D Strain GLS Avg:     -15.4 %  3D Volume EF: 3D EF:        65 % LV EDV:       95 ml LV ESV:       34 ml LV SV:        61 ml  RIGHT VENTRICLE RV Basal diam:  3.30 cm RV Mid diam:    2.84 cm RV S prime:     14.90 cm/s TAPSE (M-mode): 2.0 cm  LEFT ATRIUM              Index        RIGHT ATRIUM           Index LA diam:        3.90 cm 2.22 cm/m   RA Area:     11.10 cm LA Vol (A2C):   29.5 ml 16.79 ml/m  RA Volume:   22.10 ml  12.58 ml/m LA Vol (A4C):   19.2 ml 10.93 ml/m LA Biplane Vol: 24.7 ml 14.06 ml/m AORTIC VALVE LVOT Vmax:   115.00 cm/s LVOT Vmean:  75.300 cm/s LVOT VTI:    0.248 m  AORTA Ao Root diam: 2.80 cm Ao Asc diam:  3.10 cm  MITRAL VALVE               TRICUSPID VALVE MV Area (PHT): 4.68 cm    TR Peak grad:   21.3 mmHg MV Decel Time: 162 msec    TR Vmax:        231.00 cm/s MV E velocity: 57.70 cm/s MV A velocity: 59.60 cm/s  SHUNTS MV E/A ratio:  0.97        Systemic VTI:  0.25 m Systemic Diam: 1.90 cm  Janelle Mediate MD Electronically signed by Janelle Mediate MD Signature Date/Time: 03/08/2022/3:55:51 PM    Final      CT SCANS  CT CARDIAC SCORING (SELF PAY ONLY) 03/30/2022  Addendum 03/31/2022  1:20 PM ADDENDUM REPORT: 03/31/2022 13:17  EXAM: OVER-READ INTERPRETATION  CT CHEST  The following report is an over-read performed by radiologist Dr. Micky [facility]bee [facility] on 03/31/2022. This over-read does not include interpretation of cardiac or coronary anatomy or pathology. The coronary calcium  score interpretation by the cardiologist is attached.  COMPARISON:  None.  FINDINGS: Vascular: Normal heart size. No pericardial effusion. Normal caliber thoracic aorta with mild atherosclerotic disease.  Mediastinum/Nodes: Esophagus is unremarkable. No pathologically enlarged lymph nodes seen in the chest.  Lungs/Pleura: Lungs are clear. No pleural effusion or pneumothorax.  Upper Abdomen: No acute abnormality.  Musculoskeletal: No chest wall mass or suspicious bone lesions identified.  IMPRESSION: 1. No acute extracardiac findings. 2. Aortic Atherosclerosis (ICD10-I70.0).   Electronically Signed By: Avelino Lek M.D. On: 03/31/2022 13:17  Narrative CLINICAL DATA:  1F for  cardiovascular disease risk stratification  EXAM: Coronary Calcium  Score  TECHNIQUE: A gated, non-contrast computed tomography scan of the heart was performed using 3mm slice thickness. Axial images were analyzed on a dedicated workstation. Calcium  scoring of the coronary arteries was performed using the Agatston method.  FINDINGS: Coronary arteries: Normal origins.  Coronary Calcium  Score:  Left main: 0  Left anterior descending artery: 1.37  Left circumflex artery: 17.4  Right coronary artery: 0  Total: 18.8  Percentile: 30th  Pericardium: Normal.  Ascending Aorta: Normal caliber.  Aortic atherosclerosis.  Non-cardiac: See separate report from [facility].  IMPRESSION: Coronary calcium  score of 18.8. This was 30th percentile for age-, race-, and sex-matched controls.  Aortic atherosclerosis.  RECOMMENDATIONS: Coronary artery calcium  (CAC) score is a strong predictor of incident coronary heart disease (CHD) and provides predictive information beyond traditional risk factors. CAC scoring is reasonable to use in the decision to withhold, postpone, or initiate statin therapy in intermediate-risk or selected borderline-risk asymptomatic adults (age 21-75 years and LDL-C >=70 to <190 mg/dL) who do not have diabetes or established atherosclerotic cardiovascular disease (ASCVD).* In intermediate-risk (10-year ASCVD risk >=7.5% to <20%) adults or selected borderline-risk (10-year ASCVD risk >=5% to <7.5%) adults in whom a CAC score is measured for the purpose of making a treatment decision the following recommendations have been made:  If CAC=0, it is reasonable to withhold statin therapy and reassess in 5 to 10 years, as long as higher risk conditions are absent (diabetes mellitus, family history of premature CHD in first degree relatives (males <55 years; females <65 years), cigarette  smoking, or LDL >=190 mg/dL).  If CAC is 1 to 99, it is reasonable to  initiate statin therapy for patients >=46 years of age.  If CAC is >=100 or >=75th percentile, it is reasonable to initiate statin therapy at any age.  Cardiology referral should be considered for patients with CAC scores >=400 or >=75th percentile.  *2018 AHA/ACC/AACVPR/AAPA/ABC/ACPM/ADA/AGS/APhA/ASPC/NLA/PCNA Guideline on the Management of Blood Cholesterol: A Report of the American College of Cardiology/American Heart Association Task Force on Clinical Practice Guidelines. J Am Coll Cardiol. 2019;73(24):3168-3209.  Maudine Sos, MD  Electronically Signed: By: Maudine Sos M.D. On: 03/31/2022 12:20     ______________________________________________________________________________________________      Risk Assessment/Calculations:             Physical Exam:   VS:  BP (!) 116/54   Pulse 75   Ht 5\' 2"  (1.575 m)   Wt 157 lb (71.2 kg)   SpO2 92%   BMI 28.72 kg/m    Wt Readings from Last 3 Encounters:  10/24/23 157 lb (71.2 kg)  10/20/22 171 lb 11.2 oz (77.9 kg)  04/25/22 170 lb 1.6 oz (77.2 kg)    GEN: Well nourished, well developed in no acute distress NECK: No JVD; No carotid bruits CARDIAC: RRR, no murmurs, rubs, gallops RESPIRATORY:  Clear to auscultation without rales, wheezing or rhonchi  ABDOMEN: Soft, non-tender, non-distended EXTREMITIES:  No edema; No deformity   ASSESSMENT AND PLAN: .    HTN - BP at goal <130/80. Relatively hypotensive today but no significant lightheadedness, dizziness. Continue current regimen Losartan  100mg  daily, Amlodipine  5mg  daily, Toprol  50mg  daily. Discussed to monitor BP at home at least 2 hours after medications and sitting for 5-10 minutes.   CAD / HLD, LDL goal <70 - Stable with no anginal symptoms. No indication for ischemic evaluation.  GDMT Aspirin  81mg  daily, Rosuvastatin  5mg  daily, Toprol  50mg  daily. Recommend aiming for 150 minutes of moderate intensity activity per week and following a heart healthy diet.     DM2 - Continue to follow with PCP.   Caregiver stress - Acts as caretaker for her husband who had stroke. Has some support through hospice. Provided info on stroke support group.        Dispo: follow up in 1 year with Dr. Theodis Fiscal or APP  Signed, Clearnce Curia, NP

## 2023-10-25 DIAGNOSIS — M79644 Pain in right finger(s): Secondary | ICD-10-CM | POA: Diagnosis not present

## 2023-10-25 DIAGNOSIS — M65351 Trigger finger, right little finger: Secondary | ICD-10-CM | POA: Diagnosis not present

## 2023-10-25 DIAGNOSIS — M79642 Pain in left hand: Secondary | ICD-10-CM | POA: Diagnosis not present

## 2023-10-25 DIAGNOSIS — M25521 Pain in right elbow: Secondary | ICD-10-CM | POA: Diagnosis not present

## 2023-11-08 DIAGNOSIS — M542 Cervicalgia: Secondary | ICD-10-CM | POA: Diagnosis not present

## 2023-11-08 DIAGNOSIS — M79602 Pain in left arm: Secondary | ICD-10-CM | POA: Diagnosis not present

## 2023-11-08 DIAGNOSIS — M4803 Spinal stenosis, cervicothoracic region: Secondary | ICD-10-CM | POA: Diagnosis not present

## 2023-11-08 DIAGNOSIS — M4313 Spondylolisthesis, cervicothoracic region: Secondary | ICD-10-CM | POA: Diagnosis not present

## 2023-11-08 DIAGNOSIS — M4802 Spinal stenosis, cervical region: Secondary | ICD-10-CM | POA: Diagnosis not present

## 2023-11-13 ENCOUNTER — Ambulatory Visit (HOSPITAL_BASED_OUTPATIENT_CLINIC_OR_DEPARTMENT_OTHER): Admitting: Family

## 2023-11-15 DIAGNOSIS — H6993 Unspecified Eustachian tube disorder, bilateral: Secondary | ICD-10-CM | POA: Diagnosis not present

## 2023-11-15 DIAGNOSIS — H903 Sensorineural hearing loss, bilateral: Secondary | ICD-10-CM | POA: Diagnosis not present

## 2023-11-15 DIAGNOSIS — K219 Gastro-esophageal reflux disease without esophagitis: Secondary | ICD-10-CM | POA: Diagnosis not present

## 2023-11-15 DIAGNOSIS — R490 Dysphonia: Secondary | ICD-10-CM | POA: Diagnosis not present

## 2023-11-15 DIAGNOSIS — H6121 Impacted cerumen, right ear: Secondary | ICD-10-CM | POA: Diagnosis not present

## 2023-12-18 DIAGNOSIS — Z008 Encounter for other general examination: Secondary | ICD-10-CM | POA: Diagnosis not present

## 2023-12-18 DIAGNOSIS — J4540 Moderate persistent asthma, uncomplicated: Secondary | ICD-10-CM | POA: Diagnosis not present

## 2023-12-18 DIAGNOSIS — E663 Overweight: Secondary | ICD-10-CM | POA: Diagnosis not present

## 2023-12-18 DIAGNOSIS — E039 Hypothyroidism, unspecified: Secondary | ICD-10-CM | POA: Diagnosis not present

## 2023-12-18 DIAGNOSIS — E785 Hyperlipidemia, unspecified: Secondary | ICD-10-CM | POA: Diagnosis not present

## 2023-12-18 DIAGNOSIS — M109 Gout, unspecified: Secondary | ICD-10-CM | POA: Diagnosis not present

## 2023-12-18 DIAGNOSIS — K219 Gastro-esophageal reflux disease without esophagitis: Secondary | ICD-10-CM | POA: Diagnosis not present

## 2023-12-18 DIAGNOSIS — E1169 Type 2 diabetes mellitus with other specified complication: Secondary | ICD-10-CM | POA: Diagnosis not present

## 2023-12-18 DIAGNOSIS — I10 Essential (primary) hypertension: Secondary | ICD-10-CM | POA: Diagnosis not present

## 2023-12-18 DIAGNOSIS — R2681 Unsteadiness on feet: Secondary | ICD-10-CM | POA: Diagnosis not present

## 2023-12-18 DIAGNOSIS — Z6827 Body mass index (BMI) 27.0-27.9, adult: Secondary | ICD-10-CM | POA: Diagnosis not present

## 2023-12-21 DIAGNOSIS — M79672 Pain in left foot: Secondary | ICD-10-CM | POA: Diagnosis not present

## 2023-12-26 DIAGNOSIS — Z170 Estrogen receptor positive status [ER+]: Secondary | ICD-10-CM | POA: Diagnosis not present

## 2023-12-26 DIAGNOSIS — M542 Cervicalgia: Secondary | ICD-10-CM | POA: Diagnosis not present

## 2023-12-26 DIAGNOSIS — C50411 Malignant neoplasm of upper-outer quadrant of right female breast: Secondary | ICD-10-CM | POA: Diagnosis not present

## 2023-12-26 DIAGNOSIS — Z6829 Body mass index (BMI) 29.0-29.9, adult: Secondary | ICD-10-CM | POA: Diagnosis not present

## 2023-12-29 DIAGNOSIS — S92919A Unspecified fracture of unspecified toe(s), initial encounter for closed fracture: Secondary | ICD-10-CM | POA: Diagnosis not present

## 2024-02-09 ENCOUNTER — Telehealth: Payer: Self-pay

## 2024-02-09 NOTE — Telephone Encounter (Signed)
 Patient called the office back to schedule a NPT appointment.

## 2024-03-07 DIAGNOSIS — E039 Hypothyroidism, unspecified: Secondary | ICD-10-CM | POA: Diagnosis not present

## 2024-03-07 DIAGNOSIS — I10 Essential (primary) hypertension: Secondary | ICD-10-CM | POA: Diagnosis not present

## 2024-03-07 DIAGNOSIS — E1165 Type 2 diabetes mellitus with hyperglycemia: Secondary | ICD-10-CM | POA: Diagnosis not present

## 2024-03-07 DIAGNOSIS — I2510 Atherosclerotic heart disease of native coronary artery without angina pectoris: Secondary | ICD-10-CM | POA: Diagnosis not present

## 2024-03-11 DIAGNOSIS — Z1231 Encounter for screening mammogram for malignant neoplasm of breast: Secondary | ICD-10-CM | POA: Diagnosis not present

## 2024-03-12 DIAGNOSIS — E1165 Type 2 diabetes mellitus with hyperglycemia: Secondary | ICD-10-CM | POA: Diagnosis not present

## 2024-03-12 DIAGNOSIS — J45909 Unspecified asthma, uncomplicated: Secondary | ICD-10-CM | POA: Diagnosis not present

## 2024-03-12 DIAGNOSIS — J309 Allergic rhinitis, unspecified: Secondary | ICD-10-CM | POA: Diagnosis not present

## 2024-03-12 DIAGNOSIS — Z23 Encounter for immunization: Secondary | ICD-10-CM | POA: Diagnosis not present

## 2024-03-12 DIAGNOSIS — I10 Essential (primary) hypertension: Secondary | ICD-10-CM | POA: Diagnosis not present

## 2024-03-12 DIAGNOSIS — E039 Hypothyroidism, unspecified: Secondary | ICD-10-CM | POA: Diagnosis not present

## 2024-03-12 DIAGNOSIS — M797 Fibromyalgia: Secondary | ICD-10-CM | POA: Diagnosis not present

## 2024-03-12 DIAGNOSIS — E041 Nontoxic single thyroid nodule: Secondary | ICD-10-CM | POA: Diagnosis not present

## 2024-03-12 DIAGNOSIS — R011 Cardiac murmur, unspecified: Secondary | ICD-10-CM | POA: Diagnosis not present

## 2024-03-12 DIAGNOSIS — K219 Gastro-esophageal reflux disease without esophagitis: Secondary | ICD-10-CM | POA: Diagnosis not present

## 2024-03-12 DIAGNOSIS — I2510 Atherosclerotic heart disease of native coronary artery without angina pectoris: Secondary | ICD-10-CM | POA: Diagnosis not present
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
## 2024-03-28 ENCOUNTER — Ambulatory Visit

## 2024-03-28 ENCOUNTER — Ambulatory Visit: Attending: Physician Assistant | Admitting: Physician Assistant

## 2024-03-28 ENCOUNTER — Encounter: Payer: Self-pay | Admitting: Physician Assistant

## 2024-03-28 VITALS — BP 137/77 | HR 71 | Temp 98.0°F | Resp 14 | Ht 60.5 in | Wt 154.8 lb

## 2024-03-28 DIAGNOSIS — I10 Essential (primary) hypertension: Secondary | ICD-10-CM | POA: Diagnosis not present

## 2024-03-28 DIAGNOSIS — C50411 Malignant neoplasm of upper-outer quadrant of right female breast: Secondary | ICD-10-CM | POA: Diagnosis not present

## 2024-03-28 DIAGNOSIS — I2510 Atherosclerotic heart disease of native coronary artery without angina pectoris: Secondary | ICD-10-CM

## 2024-03-28 DIAGNOSIS — M546 Pain in thoracic spine: Secondary | ICD-10-CM

## 2024-03-28 DIAGNOSIS — J4520 Mild intermittent asthma, uncomplicated: Secondary | ICD-10-CM | POA: Diagnosis not present

## 2024-03-28 DIAGNOSIS — Z8719 Personal history of other diseases of the digestive system: Secondary | ICD-10-CM

## 2024-03-28 DIAGNOSIS — M810 Age-related osteoporosis without current pathological fracture: Secondary | ICD-10-CM

## 2024-03-28 DIAGNOSIS — M797 Fibromyalgia: Secondary | ICD-10-CM | POA: Diagnosis not present

## 2024-03-28 DIAGNOSIS — M130 Polyarthritis, unspecified: Secondary | ICD-10-CM | POA: Diagnosis not present

## 2024-03-28 DIAGNOSIS — M79641 Pain in right hand: Secondary | ICD-10-CM

## 2024-03-28 DIAGNOSIS — Z840 Family history of diseases of the skin and subcutaneous tissue: Secondary | ICD-10-CM

## 2024-03-28 DIAGNOSIS — M4802 Spinal stenosis, cervical region: Secondary | ICD-10-CM

## 2024-03-28 DIAGNOSIS — M5030 Other cervical disc degeneration, unspecified cervical region: Secondary | ICD-10-CM | POA: Diagnosis not present

## 2024-03-28 DIAGNOSIS — E039 Hypothyroidism, unspecified: Secondary | ICD-10-CM | POA: Diagnosis not present

## 2024-03-28 DIAGNOSIS — M48062 Spinal stenosis, lumbar region with neurogenic claudication: Secondary | ICD-10-CM

## 2024-03-28 DIAGNOSIS — E1165 Type 2 diabetes mellitus with hyperglycemia: Secondary | ICD-10-CM

## 2024-03-28 DIAGNOSIS — M79642 Pain in left hand: Secondary | ICD-10-CM | POA: Diagnosis not present

## 2024-03-28 DIAGNOSIS — Z8249 Family history of ischemic heart disease and other diseases of the circulatory system: Secondary | ICD-10-CM

## 2024-03-28 DIAGNOSIS — Z170 Estrogen receptor positive status [ER+]: Secondary | ICD-10-CM

## 2024-03-28 DIAGNOSIS — I517 Cardiomegaly: Secondary | ICD-10-CM

## 2024-03-29 ENCOUNTER — Telehealth: Payer: Self-pay

## 2024-03-29 NOTE — Telephone Encounter (Addendum)
 Patient called the office to let Waddell know that she takes arthritis 650 mg acetaminophen  extended relief tablets as needed.

## 2024-03-31 ENCOUNTER — Ambulatory Visit: Payer: Self-pay | Admitting: Physician Assistant

## 2024-03-31 NOTE — Telephone Encounter (Signed)
Please add to med list  Thank you

## 2024-03-31 NOTE — Progress Notes (Signed)
 Plan to discuss results at NPFU

## 2024-04-01 NOTE — Addendum Note (Signed)
 Addended by: Lety Cullens P on: 04/01/2024 08:38 AM   Modules accepted: Orders

## 2024-04-01 NOTE — Telephone Encounter (Signed)
Medication added to patients medication list. °

## 2024-04-03 LAB — COMPREHENSIVE METABOLIC PANEL WITH GFR
AG Ratio: 1.6 (calc) (ref 1.0–2.5)
ALT: 27 U/L (ref 6–29)
AST: 26 U/L (ref 10–35)
Albumin: 4.6 g/dL (ref 3.6–5.1)
Alkaline phosphatase (APISO): 79 U/L (ref 37–153)
BUN: 15 mg/dL (ref 7–25)
CO2: 25 mmol/L (ref 20–32)
Calcium: 10 mg/dL (ref 8.6–10.4)
Chloride: 106 mmol/L (ref 98–110)
Creat: 0.76 mg/dL (ref 0.60–0.95)
Globulin: 2.9 g/dL (ref 1.9–3.7)
Glucose, Bld: 84 mg/dL (ref 65–99)
Potassium: 4.2 mmol/L (ref 3.5–5.3)
Sodium: 138 mmol/L (ref 135–146)
Total Bilirubin: 0.7 mg/dL (ref 0.2–1.2)
Total Protein: 7.5 g/dL (ref 6.1–8.1)
eGFR: 79 mL/min/1.73m2 (ref >=60)

## 2024-04-03 LAB — CBC WITH DIFFERENTIAL/PLATELET
Absolute Lymphocytes: 2110 {cells}/uL (ref 850–3900)
Absolute Monocytes: 440 {cells}/uL (ref 200–950)
Basophils Absolute: 50 {cells}/uL (ref 0–200)
Basophils Relative: 1 %
Eosinophils Absolute: 10 {cells}/uL — ABNORMAL LOW (ref 15–500)
Eosinophils Relative: 0.2 %
HCT: 38.7 % (ref 35.0–45.0)
Hemoglobin: 12.6 g/dL (ref 11.7–15.5)
MCH: 29.9 pg (ref 27.0–33.0)
MCHC: 32.6 g/dL (ref 32.0–36.0)
MCV: 91.9 fL (ref 80.0–100.0)
MPV: 9.8 fL (ref 7.5–12.5)
Monocytes Relative: 8.8 %
Neutro Abs: 2390 {cells}/uL (ref 1500–7800)
Neutrophils Relative %: 47.8 %
Platelets: 319 Thousand/uL (ref 140–400)
RBC: 4.21 Million/uL (ref 3.80–5.10)
RDW: 13.2 % (ref 11.0–15.0)
Total Lymphocyte: 42.2 %
WBC: 5 Thousand/uL (ref 3.8–10.8)

## 2024-04-03 LAB — ANTI-NUCLEAR AB-TITER (ANA TITER): ANA Titer 1: 1:160 {titer} — ABNORMAL HIGH

## 2024-04-03 LAB — C-REACTIVE PROTEIN: CRP: 3.3 mg/L (ref <8.0)

## 2024-04-03 LAB — SJOGRENS SYNDROME-B EXTRACTABLE NUCLEAR ANTIBODY: SSB (La) (ENA) Antibody, IgG: <1 AI

## 2024-04-03 LAB — SJOGRENS SYNDROME-A EXTRACTABLE NUCLEAR ANTIBODY: SSA (Ro) (ENA) Antibody, IgG: <1 AI

## 2024-04-03 LAB — MUTATED CITRULLINATED VIMENTIN (MCV) ANTIBODY: MUTATED CITRULLINATED VIMENTIN (MCV) AB: <20 U/mL (ref <20)

## 2024-04-03 LAB — C3 AND C4
C3 Complement: 139 mg/dL (ref 83–193)
C4 Complement: 34 mg/dL (ref 15–57)

## 2024-04-03 LAB — ANTI-SCLERODERMA ANTIBODY: Scleroderma (Scl-70) (ENA) Antibody, IgG: <1 AI

## 2024-04-03 LAB — RNP ANTIBODY: Ribonucleic Protein(ENA) Antibody, IgG: <1 AI

## 2024-04-03 LAB — ANA: Anti Nuclear Antibody (ANA): POSITIVE — AB

## 2024-04-03 LAB — ANTI-DNA ANTIBODY, DOUBLE-STRANDED: ds DNA Ab: <1 [IU]/mL

## 2024-04-03 LAB — SEDIMENTATION RATE: Sed Rate: 11 mm/h (ref 0–30)

## 2024-04-03 LAB — ANTI-SMITH ANTIBODY: ENA SM Ab Ser-aCnc: <1 AI

## 2024-04-03 LAB — CYCLIC CITRUL PEPTIDE ANTIBODY, IGG: Cyclic Citrullin Peptide Ab: <16 U

## 2024-04-03 LAB — RHEUMATOID FACTOR: Rheumatoid fact SerPl-aCnc: <10 [IU]/mL (ref <14)

## 2024-04-03 NOTE — Progress Notes (Signed)
 Please see if the patient can be scheduled for an ultrasound of both hands if there is an opening prior to the NPFU scheduled

## 2024-04-04 ENCOUNTER — Ambulatory Visit

## 2024-04-04 ENCOUNTER — Ambulatory Visit: Attending: Rheumatology | Admitting: Rheumatology

## 2024-04-04 DIAGNOSIS — M79642 Pain in left hand: Secondary | ICD-10-CM

## 2024-04-04 DIAGNOSIS — M79641 Pain in right hand: Secondary | ICD-10-CM

## 2024-04-04 DIAGNOSIS — R7689 Other specified abnormal immunological findings in serum: Secondary | ICD-10-CM

## 2024-04-04 NOTE — Progress Notes (Signed)
 Visit diagnosis: Pain in both hands, positive ANA  Limited ultrasound examination of bilateral hands was performed per EULAR recommendations. Using 15 MHz transducer, grayscale and power Doppler bilateral second and third MCP joints and bilateral wrist joints dorsal  aspects were evaluated to look for synovitis or tenosynovitis. The findings were there was  synovitis or tenosynovitis in the extensor compartment of bilateral wrists on the ultrasound examination.   Impression: Extensor synovitis and tenosynovitis was noted on the limited ultrasound examination of bilateral hands.  Ultrasound results were discussed with the patient.  Patient voiced understanding.  Maya Nash, MD
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
## 2024-04-15 ENCOUNTER — Encounter: Payer: Self-pay | Admitting: Physician Assistant

## 2024-04-15 ENCOUNTER — Ambulatory Visit: Attending: Physician Assistant | Admitting: Physician Assistant

## 2024-04-15 VITALS — BP 136/75 | HR 71 | Temp 98.3°F | Resp 14 | Ht 61.5 in | Wt 160.8 lb

## 2024-04-15 DIAGNOSIS — M19041 Primary osteoarthritis, right hand: Secondary | ICD-10-CM | POA: Diagnosis not present

## 2024-04-15 DIAGNOSIS — E1165 Type 2 diabetes mellitus with hyperglycemia: Secondary | ICD-10-CM

## 2024-04-15 DIAGNOSIS — Z114 Encounter for screening for human immunodeficiency virus [HIV]: Secondary | ICD-10-CM

## 2024-04-15 DIAGNOSIS — C50411 Malignant neoplasm of upper-outer quadrant of right female breast: Secondary | ICD-10-CM

## 2024-04-15 DIAGNOSIS — I2510 Atherosclerotic heart disease of native coronary artery without angina pectoris: Secondary | ICD-10-CM

## 2024-04-15 DIAGNOSIS — E039 Hypothyroidism, unspecified: Secondary | ICD-10-CM

## 2024-04-15 DIAGNOSIS — Z79899 Other long term (current) drug therapy: Secondary | ICD-10-CM

## 2024-04-15 DIAGNOSIS — M0600 Rheumatoid arthritis without rheumatoid factor, unspecified site: Secondary | ICD-10-CM

## 2024-04-15 DIAGNOSIS — M19042 Primary osteoarthritis, left hand: Secondary | ICD-10-CM

## 2024-04-15 DIAGNOSIS — I517 Cardiomegaly: Secondary | ICD-10-CM

## 2024-04-15 DIAGNOSIS — J4520 Mild intermittent asthma, uncomplicated: Secondary | ICD-10-CM

## 2024-04-15 DIAGNOSIS — R7689 Other specified abnormal immunological findings in serum: Secondary | ICD-10-CM

## 2024-04-15 DIAGNOSIS — Z111 Encounter for screening for respiratory tuberculosis: Secondary | ICD-10-CM

## 2024-04-15 DIAGNOSIS — Z1159 Encounter for screening for other viral diseases: Secondary | ICD-10-CM

## 2024-04-15 DIAGNOSIS — Z8249 Family history of ischemic heart disease and other diseases of the circulatory system: Secondary | ICD-10-CM

## 2024-04-15 DIAGNOSIS — M810 Age-related osteoporosis without current pathological fracture: Secondary | ICD-10-CM

## 2024-04-15 DIAGNOSIS — M797 Fibromyalgia: Secondary | ICD-10-CM

## 2024-04-15 DIAGNOSIS — Z170 Estrogen receptor positive status [ER+]: Secondary | ICD-10-CM

## 2024-04-15 DIAGNOSIS — M546 Pain in thoracic spine: Secondary | ICD-10-CM

## 2024-04-15 DIAGNOSIS — M48062 Spinal stenosis, lumbar region with neurogenic claudication: Secondary | ICD-10-CM

## 2024-04-15 DIAGNOSIS — Z8719 Personal history of other diseases of the digestive system: Secondary | ICD-10-CM

## 2024-04-15 DIAGNOSIS — M5030 Other cervical disc degeneration, unspecified cervical region: Secondary | ICD-10-CM

## 2024-04-15 DIAGNOSIS — I10 Essential (primary) hypertension: Secondary | ICD-10-CM

## 2024-04-15 MED ORDER — HYDROXYCHLOROQUINE SULFATE 200 MG PO TABS: 200.0000 mg | ORAL_TABLET | Freq: Every day | ORAL | 0 refills | Status: DC

## 2024-04-15 NOTE — Patient Instructions (Signed)
 Standing Labs We placed an order today for your standing lab work.   Please have your standing labs drawn in 1 month, 3 months, every 5 months  Please have your labs drawn 2 weeks prior to your appointment so that the provider can discuss your lab results at your appointment, if possible.  Please note that you may see your imaging and lab results in MyChart before we have reviewed them. We will contact you once all results are reviewed. Please allow our office up to 72 hours to thoroughly review all of the results before contacting the office for clarification of your results.  WALK-IN LAB HOURS  Monday through Thursday from 8:00 am -12:30 pm and 1:00 pm-4:30 pm and Friday from 8:00 am-12:00 pm.  Patients with office visits requiring labs will be seen before walk-in labs.  You may encounter longer than normal wait times. Please allow additional time. Wait times may be shorter on  Monday and Thursday afternoons.  We do not book appointments for walk-in labs. We appreciate your patience and understanding with our staff.   Labs are drawn by [facility]. Please bring your co-pay at the time of your lab draw.  You may receive a bill from [facility] for your lab work.  Please note if you are on Hydroxychloroquine and and an order has been placed for a Hydroxychloroquine level,  you will need to have it drawn 4 hours or more after your last dose.  If you wish to have your labs drawn at another location, please call the office 24 hours in advance so we can fax the orders.  The office is located at [address], [place], [place] [address]   If you have any questions regarding directions or hours of operation,  please call [phone].   As a reminder, please drink plenty of water prior to coming for your lab work. Thanks!  Hydroxychloroquine Tablets What is this medication? HYDROXYCHLOROQUINE (hye drox ee KLOR oh kwin) treats autoimmune conditions, such as rheumatoid arthritis and lupus.  It works by slowing down an overactive immune system. It may also be used to prevent and treat malaria. It works by killing the parasite that causes malaria. It belongs to a group of medications called DMARDs. This medicine may be used for other purposes; ask your health care provider or pharmacist if you have questions. COMMON BRAND NAME(S): Plaquenil, Quineprox, SOVUNA What should I tell my care team before I take this medication? They need to know if you have any of these conditions: Diabetes Eye disease, vision problems Frequently drink alcohol G6PD deficiency Heart disease Irregular heartbeat or rhythm Kidney disease Liver disease Porphyria Psoriasis An unusual or allergic reaction to hydroxychloroquine, other medications, foods, dyes, or preservatives Pregnant or trying to get pregnant Breastfeeding How should I use this medication? Take this medication by mouth with water. Take it as directed on the prescription label. Do not cut, crush, or chew this medication. Swallow the tablets whole. Take it with food. Do not take it more than directed. Take all of this medication unless your care team tells you to stop it early. Keep taking it even if you think you are better. Take products with antacids in them at a different time of day than this medication. Take this medication 4 hours before or 4 hours after antacids. Talk to your care team if you have questions. Talk to your care team about the use of this medication in children. While this medication may be prescribed for selected  conditions, precautions do apply. Overdosage: If you think you have taken too much of this medicine contact a poison control center or emergency room at once. NOTE: This medicine is only for you. Do not share this medicine with others. What if I miss a dose? If you miss a dose, take it as soon as you can. If it is almost time for your next dose, take only that dose. Do not take double or extra doses. What may  interact with this medication? Do not take this medication with any of the following: Cisapride Dronedarone Pimozide Thioridazine This medication may also interact with the following: Ampicillin Antacids Cimetidine Cyclosporine Digoxin Kaolin Medications for diabetes, such as insulin, glipizide, glyburide Medications for seizures, such as carbamazepine, phenobarbital, phenytoin Mefloquine Methotrexate Other medications that cause heart rhythm changes Praziquantel This list may not describe all possible interactions. Give your health care provider a list of all the medicines, herbs, non-prescription drugs, or dietary supplements you use. Also tell them if you smoke, drink alcohol, or use illegal drugs. Some items may interact with your medicine. What should I watch for while using this medication? Visit your care team for regular checks on your progress. Tell your care team if your symptoms do not start to get better or if they get worse. You may need blood work done while you are taking this medication. If you take other medications that can affect heart rhythm, you may need more testing. Talk to your care team if you have questions. Your vision may be tested before and during use of this medication. Tell your care team right away if you have any change in your eyesight. This medication may cause serious skin reactions. They can happen weeks to months after starting the medication. Contact your care team right away if you notice fevers or flu-like symptoms with a rash. The rash may be red or purple and then turn into blisters or peeling of the skin. Or, you might notice a red rash with swelling of the face, lips or lymph nodes in your neck or under your arms. If you or your family notice any changes in your behavior, such as new or worsening depression, thoughts of harming yourself, anxiety, or other unusual or disturbing thoughts, or memory loss, call your care team right away. What side  effects may I notice from receiving this medication? Side effects that you should report to your care team as soon as possible: Allergic reactions--skin rash, itching, hives, swelling of the face, lips, tongue, or throat Aplastic anemia--unusual weakness or fatigue, dizziness, headache, trouble breathing, increased bleeding or bruising Change in vision Heart rhythm changes--fast or irregular heartbeat, dizziness, feeling faint or lightheaded, chest pain, trouble breathing Infection--fever, chills, cough, or sore throat Low blood sugar (hypoglycemia)--tremors or shaking, anxiety, sweating, cold or clammy skin, confusion, dizziness, rapid heartbeat Muscle injury--unusual weakness or fatigue, muscle pain, dark yellow or brown urine, decrease in amount of urine Pain, tingling, or numbness in the hands or feet Rash, fever, and swollen lymph nodes Redness, blistering, peeling, or loosening of the skin, including inside the mouth Thoughts of suicide or self-harm, worsening mood, or feelings of depression Unusual bruising or bleeding Side effects that usually do not require medical attention (report to your care team if they continue or are bothersome): Diarrhea Headache Nausea Stomach pain Vomiting This list may not describe all possible side effects. Call your doctor for medical advice about side effects. You may report side effects to FDA at 1-800-FDA-1088. Where should I  keep my medication? Keep out of the reach of children and pets. Store at room temperature up to 30 degrees C (86 degrees F). Protect from light. Get rid of any unused medication after the expiration date. To get rid of medications that are no longer needed or have expired: Take the medication to a medication take-back program. Check with your pharmacy or law enforcement to find a location. If you cannot return the medication, check the label or package insert to see if the medication should be thrown out in the garbage or  flushed down the toilet. If you are not sure, ask your care team. If it is safe to put it in the trash, empty the medication out of the container. Mix the medication with cat litter, dirt, coffee grounds, or other unwanted substance. Seal the mixture in a bag or container. Put it in the trash. NOTE: This sheet is a summary. It may not cover all possible information. If you have questions about this medicine, talk to your doctor, pharmacist, or health care provider.  2024 Elsevier/Gold Standard (2021-11-29 00:00:00)

## 2024-04-15 NOTE — Progress Notes (Signed)
 Pharmacy Note  Subjective: Patient presents today to [facility] for follow up office visit.   Patient seen by the pharmacist for counseling on hydroxychloroquine for rheumatoid arthritis.    Objective: CMP     Component Value Date/Time   NA 138 03/28/2024 1144   NA 141 01/23/2019 0000   K 4.2 03/28/2024 1144   CL 106 03/28/2024 1144   CO2 25 03/28/2024 1144   GLUCOSE 84 03/28/2024 1144   BUN 15 03/28/2024 1144   BUN 17 01/23/2019 0000   CREATININE 0.76 03/28/2024 1144   CALCIUM  10.0 03/28/2024 1144   PROT 7.5 03/28/2024 1144   ALBUMIN 4.4 06/08/2023 1531   AST 26 03/28/2024 1144   ALT 27 03/28/2024 1144   ALKPHOS 106 06/08/2023 1531   BILITOT 0.7 03/28/2024 1144   GFRNONAA >60 06/08/2023 1531   GFRNONAA 57 (L) 03/02/2020 1403   GFRAA 66 03/02/2020 1403    CBC    Component Value Date/Time   WBC 5.0 03/28/2024 1144   RBC 4.21 03/28/2024 1144   HGB 12.6 03/28/2024 1144   HCT 38.7 03/28/2024 1144   PLT 319 03/28/2024 1144   MCV 91.9 03/28/2024 1144   MCH 29.9 03/28/2024 1144   MCHC 32.6 03/28/2024 1144   RDW 13.2 03/28/2024 1144   LYMPHSABS 1,897 02/01/2021 1202   MONOABS 0.5 05/16/2015 0430   EOSABS 10 (L) 03/28/2024 1144   BASOSABS 50 03/28/2024 1144   Assessment/Plan: Patient was counseled on the purpose, proper use, and adverse effects of hydroxychloroquine including nausea/diarrhea, skin rash, headaches, and sun sensitivity.  Advised patient to wear sunscreen once starting hydroxychloroquine to reduce risk of rash associated with sun sensitivity.  Discussed importance of annual eye exams while on hydroxychloroquine to monitor to ocular toxicity and discussed importance of frequent laboratory monitoring.  Provided patient with eye exam form for baseline ophthalmologic exam.  Provided patient with educational materials on hydroxychloroquine and answered all questions.  Patient consented to hydroxychloroquine. Will upload consent in the media tab.     Reviewed risk for QTC prolongation when used in combination with other QTc prolonging agents (including but not limited to antiarrhythmics, macrolide antibiotics, flouroquinolone antibiotics, haloperidol, quetiapine, olanzapine, risperidone, droperidol, ziprasidone, amitriptyline, citalopram, ondansetron , migraine triptans, and methadone).  Hydroxychloroquine dose will be 200 mg once daily..  Prescription sent to pharmacy. Standing orders for CBC/CMP placed. Repeat labs in 1 month, 3 months, every 5 months.  Sherry Pennant, PharmD, MPH, BCPS, CPP Clinical Pharmacist [facility])

## 2024-04-30 ENCOUNTER — Ambulatory Visit: Admitting: Physician Assistant

## 2024-05-16 ENCOUNTER — Telehealth: Payer: Self-pay | Admitting: Physician Assistant

## 2024-05-16 DIAGNOSIS — Z79899 Other long term (current) drug therapy: Secondary | ICD-10-CM

## 2024-05-16 DIAGNOSIS — M0600 Rheumatoid arthritis without rheumatoid factor, unspecified site: Secondary | ICD-10-CM

## 2024-05-16 NOTE — Telephone Encounter (Signed)
 Ok to place referral

## 2024-05-16 NOTE — Telephone Encounter (Signed)
 Referral placed.

## 2024-05-16 NOTE — Telephone Encounter (Signed)
 Patient contacted the office requesting a referral to [facility] (Dr. Marcey) .  Patient request referral for eye exam.  Patient's preferred area for referral is: [place]

## 2024-05-16 NOTE — Addendum Note (Signed)
 Addended by: CENA ALFONSO CROME on: 05/16/2024 10:43 AM   Modules accepted: Orders

## 2024-06-14 LAB — OPHTHALMOLOGY REPORT-SCANNED

## 2024-06-20 ENCOUNTER — Telehealth: Payer: Self-pay

## 2024-06-20 ENCOUNTER — Other Ambulatory Visit: Payer: Self-pay | Admitting: Physician Assistant

## 2024-06-20 DIAGNOSIS — M0600 Rheumatoid arthritis without rheumatoid factor, unspecified site: Secondary | ICD-10-CM

## 2024-06-20 NOTE — Telephone Encounter (Signed)
 Patient contacted the office and inquires why her prescription request for PLQ was denied. Advised the patient she was sent a 90 day fill on 04/15/2024 and should not be due for another refill until February. Patient states she had been taking 2 tablets daily instead of one daily. Advised the patient her prescription was 1 tablet daily. Patient states she plans to come in for labs tomorrow. Please advise.

## 2024-06-21 MED ORDER — HYDROXYCHLOROQUINE SULFATE 200 MG PO TABS: 200.0000 mg | ORAL_TABLET | Freq: Every day | ORAL | 0 refills | Status: AC

## 2024-06-21 NOTE — Telephone Encounter (Signed)
 Okay to send a new prescription for Plaquenil  200 mg p.o. daily.  90-day supply.  Please discuss the Plaquenil  dosing with the patient.

## 2024-06-21 NOTE — Telephone Encounter (Signed)
Attempted to contact patient and left message on machine to advise patient to call the office.  

## 2024-06-21 NOTE — Addendum Note (Signed)
 Addended by: CENA ALFONSO CROME on: 06/21/2024 11:13 AM   Modules accepted: Orders

## 2024-06-21 NOTE — Telephone Encounter (Signed)
 Patient advised Okay to send a new prescription for Plaquenil  200 mg p.o. daily. 90-day supply. Patient expressed understanding of Plaquenil  dosing.

## 2024-06-24 ENCOUNTER — Other Ambulatory Visit: Payer: Self-pay | Admitting: *Deleted

## 2024-06-24 DIAGNOSIS — Z79899 Other long term (current) drug therapy: Secondary | ICD-10-CM

## 2024-06-24 DIAGNOSIS — M0600 Rheumatoid arthritis without rheumatoid factor, unspecified site: Secondary | ICD-10-CM

## 2024-06-25 ENCOUNTER — Ambulatory Visit: Payer: Self-pay | Admitting: Physician Assistant

## 2024-06-25 LAB — CBC WITH DIFFERENTIAL/PLATELET
Absolute Lymphocytes: 2224 {cells}/uL (ref 850–3900)
Absolute Monocytes: 472 {cells}/uL (ref 200–950)
Basophils Absolute: 59 {cells}/uL (ref 0–200)
Basophils Relative: 1 %
Eosinophils Absolute: 18 {cells}/uL (ref 15–500)
Eosinophils Relative: 0.3 %
HCT: 38.8 % (ref 35.9–46.0)
Hemoglobin: 13 g/dL (ref 11.7–15.5)
MCH: 30.4 pg (ref 27.0–33.0)
MCHC: 33.5 g/dL (ref 31.6–35.4)
MCV: 90.7 fL (ref 81.4–101.7)
MPV: 9.6 fL (ref 7.5–12.5)
Monocytes Relative: 8 %
Neutro Abs: 3127 {cells}/uL (ref 1500–7800)
Neutrophils Relative %: 53 %
Platelets: 320 Thousand/uL (ref 140–400)
RBC: 4.28 Million/uL (ref 3.80–5.10)
RDW: 13.1 % (ref 11.0–15.0)
Total Lymphocyte: 37.7 %
WBC: 5.9 Thousand/uL (ref 3.8–10.8)

## 2024-06-25 LAB — COMPREHENSIVE METABOLIC PANEL WITH GFR
AG Ratio: 1.6 (calc) (ref 1.0–2.5)
ALT: 32 U/L — ABNORMAL HIGH (ref 6–29)
AST: 32 U/L (ref 10–35)
Albumin: 4.2 g/dL (ref 3.6–5.1)
Alkaline phosphatase (APISO): 77 U/L (ref 37–153)
BUN: 13 mg/dL (ref 7–25)
CO2: 26 mmol/L (ref 20–32)
Calcium: 9.8 mg/dL (ref 8.6–10.4)
Chloride: 104 mmol/L (ref 98–110)
Creat: 0.91 mg/dL (ref 0.60–0.95)
Globulin: 2.6 g/dL (ref 1.9–3.7)
Glucose, Bld: 107 mg/dL — ABNORMAL HIGH (ref 65–99)
Potassium: 4.1 mmol/L (ref 3.5–5.3)
Sodium: 137 mmol/L (ref 135–146)
Total Bilirubin: 0.6 mg/dL (ref 0.2–1.2)
Total Protein: 6.8 g/dL (ref 6.1–8.1)
eGFR: 64 mL/min/1.73m2

## 2024-06-25 NOTE — Progress Notes (Signed)
 CBC WNL Glucose is 107, ALT is borderline elevated-32-any tylenol  or alcohol use? Rest of CMP WNL.

## 2024-06-25 NOTE — Progress Notes (Signed)
 Please schedule sooner visit-may want to consider ultrasound guided injection depending on exam. Please schedule on a day while Dr. Dolphus and Reena will be in the office. Thank you!

## 2024-06-26 ENCOUNTER — Telehealth: Payer: Self-pay | Admitting: Rheumatology

## 2024-06-26 NOTE — Telephone Encounter (Signed)
 Attempted to contact patient and left voicemail for patient to call and schedule an earlier appointment for ultrasound with Dr. Dolphus.

## 2024-07-23 ENCOUNTER — Ambulatory Visit: Admitting: Rheumatology

## 2024-08-20 ENCOUNTER — Ambulatory Visit: Admitting: Physician Assistant
